# Patient Record
Sex: Male | Born: 1960 | Race: White | Hispanic: No | Marital: Married | Smoking: Never smoker
Health system: Southern US, Academic
[De-identification: ages and names within clinical notes are randomized; demographics above are authoritative.]

## PROBLEM LIST (undated history)

## (undated) ENCOUNTER — Encounter (HOSPITAL_COMMUNITY): Admission: RE | Payer: Self-pay | Source: Ambulatory Visit

## (undated) ENCOUNTER — Ambulatory Visit (HOSPITAL_COMMUNITY): Admission: RE | Payer: BC Managed Care – PPO | Source: Ambulatory Visit | Admitting: Internal Medicine

## (undated) DIAGNOSIS — Z973 Presence of spectacles and contact lenses: Secondary | ICD-10-CM

## (undated) DIAGNOSIS — G4730 Sleep apnea, unspecified: Secondary | ICD-10-CM

## (undated) DIAGNOSIS — F419 Anxiety disorder, unspecified: Secondary | ICD-10-CM

## (undated) DIAGNOSIS — F410 Panic disorder [episodic paroxysmal anxiety] without agoraphobia: Secondary | ICD-10-CM

## (undated) DIAGNOSIS — R1110 Vomiting, unspecified: Secondary | ICD-10-CM

## (undated) DIAGNOSIS — K219 Gastro-esophageal reflux disease without esophagitis: Secondary | ICD-10-CM

## (undated) DIAGNOSIS — E785 Hyperlipidemia, unspecified: Secondary | ICD-10-CM

## (undated) DIAGNOSIS — I10 Essential (primary) hypertension: Secondary | ICD-10-CM

## (undated) HISTORY — DX: Vomiting, unspecified: R11.10

## (undated) HISTORY — PX: HX HAND SURGERY: 2100001299

## (undated) HISTORY — DX: Hyperlipidemia, unspecified: E78.5

## (undated) HISTORY — DX: Essential (primary) hypertension: I10

## (undated) HISTORY — DX: Gastro-esophageal reflux disease without esophagitis: K21.9

## (undated) HISTORY — PX: HX WISDOM TEETH EXTRACTION: SHX21

## (undated) SURGERY — GASTROSCOPY
Anesthesia: Monitor Anesthesia Care

## (undated) SURGERY — COLONOSCOPY
Anesthesia: Monitor Anesthesia Care

---

## 1996-07-20 ENCOUNTER — Ambulatory Visit (HOSPITAL_COMMUNITY): Payer: Self-pay

## 2006-12-07 ENCOUNTER — Ambulatory Visit (INDEPENDENT_AMBULATORY_CARE_PROVIDER_SITE_OTHER): Payer: Self-pay | Admitting: Ophthalmology

## 2009-12-21 HISTORY — PX: HX ELBOW SURGERY: 2100001297

## 2013-06-20 HISTORY — PX: HX HAND SURGERY: 2100001299

## 2013-07-10 ENCOUNTER — Ambulatory Visit (INDEPENDENT_AMBULATORY_CARE_PROVIDER_SITE_OTHER): Payer: BC Managed Care – PPO | Admitting: Orthopaedic Surgery

## 2013-07-10 ENCOUNTER — Encounter (INDEPENDENT_AMBULATORY_CARE_PROVIDER_SITE_OTHER): Payer: Self-pay | Admitting: Orthopaedic Surgery

## 2013-07-10 VITALS — BP 118/76 | Ht 72.0 in | Wt 215.0 lb

## 2013-07-10 MED ORDER — HYDROCODONE 5 MG-ACETAMINOPHEN 325 MG TABLET
1.0000 | ORAL_TABLET | ORAL | Status: DC | PRN
Start: 2013-07-10 — End: 2013-07-26

## 2013-07-11 NOTE — H&P (Addendum)
 [facility]  [address], [address]  [place], [place] [address]        OFFICE VISIT    PATIENT NAME:      Danny [name], Danny [name]  VISIT IDENTIFICATION     [id number]  MEDICAL RECORD NUMBER [id number]    DICTATING PHYSICIAN: Garnette HERO. [place], PA-C   REFERRING PHYSICIAN:         DOB: [birth year]  DOS: 07/10/2013    cc:      HISTORY OF PRESENT ILLNESS:  Danny [name] is very pleasant 52 year old male that presents to the office today with  complaints of the left hand pain. The patient states that on July 03, 2013 he  was playing fetch with his dog when he was bit on his 4th digit as well as  within the proximal aspect of the left palm. He states that he traveled then  to  [place]  to help his son during a golf tournament when his hand  began to swell and he began to develop redness and fever. He then presented to  the emergency department in [place]  where he was treated by an  orthopedic surgeon and was treated with open debridement and irrigation of the  wounds as well as with Augmentin  therapy p.o. He states he has been  tolerating antibiotics very well. He presents to the office today for a wound  check. He states that his pain has primarily subsided as well as the redness  but that he has also been nonweightbearing with his left upper extremity. He  was placed in a volar wrist splint and advised to follow up with us  to  establish care. He states that his fever has since resolved and redness, that  he is aware of, has also resolved. He states that he is still experiencing  pain worse with movement and bumping of the left hand. He rates his pain today  as 6 out of 10. He states that Norco and over-the-counter nonsteroidal  antiinflammatory drugs (NSAIDs) have made his pain better as well as  elevation. He denies any further trauma or injury since this time. He also  today denies any pins and needle sensations, paralysis, paresthesias, numbness  or bowel or left upper extremity. He denies any fevers, chills, night  sweats,  nausea, vomiting, abdominal pain, chest pain or shortness of breath or  dyspnea.       REVIEW OF SYSTEMS:  Review of systems as per history of present illness (HPI); otherwise, review  of systems is negative.     PAST MEDICAL HISTORY:  Past medical history is significant for hypertension, hyperlipidemia and  gastroesophageal reflux disease (GERD).     PAST SURGICAL HISTORY:   Past surgical history is significant for  left elbow surgery in 2011 as well  as left hand surgery in July 2014 with irrigation and debridement.     FAMILY HISTORY:  None.     SOCIAL HISTORY:  The patient denies any tobacco or intravenous (IV) drug abuse. He does admit  to intermittent and social alcohol use.     MEDICATIONS:  Medications include Welchol  3.75 grams p.o. daily, Norco 5/325 milligram p.o.  every 4 hours p.r.n. pain, Benicar  40 milligrams p.o. daily, aspirin  81  milligrams p.o. daily, Lipitor 40 milligrams p.o. daily, coenzyme Q10 30  milligrams p.o. daily, Prevacid  50 milligrams p.o. daily, Claritin 10  milligrams daily, Nasonex 50 micrograms nasal spray daily and multivitamin  p.o. daily.     ALLERGIES:  Allergies include PERCOCET.  PHYSICAL EXAMINATION:  GENERAL: Reveals 52 year old male in no apparent distress; awake, alert, and  oriented x3. He is well-nourished, well-developed, and appearing his stated  age.   VITAL SIGNS: Blood pressure: 118/76. Weight: 250 pounds. Height: 6 feet. Body  mass index (BMI): 29.16.   HEENT: Normal. Extraocular movements are intact.   LUNGS: Respirations are clear. Symmetrical rise and fall of the chest wall  with inspiration and expiration.   ABDOMEN: Soft, nondistended.   MUSCULOSKELETAL: Examination today of the left hand outside of the volar wrist  splints as well as out of the dressings reveals an incision over the proximal  phalanx of the left ring finger with clean wound bases as well as with  granulation tissue appreciated. Wound over the proximal aspect of the left  palmar  near the base of the 4th digit also reveals a larger wound with  granulation intact. Range of motion of the left ring finger is intact. Flexor  and extensor tendons are intact. Grip strength was not tested in the office.  Extensor pollicis longus (EPL) and extensor pollicis brevis (EPB) tendons are  intact. He is able to oppose and abduct his left thumb. He displays full  active range of motion of the left wrist. There is no significant erythema or  redness surrounding these incisions. There is no purulent drainage  appreciated.   NEUROLOGICAL: Cranial nerves 2 through 12 are grossly intact. No sign of any  focal motor deficit. He remains sensate to light touch in all distal nerve  distributions of the left upper extremity.   CARDIOVASCULAR: Radial pulse is palpated as 2+ on the left. Mild left-sided  upper extremity edema is appreciated. Brisk capillary refill in all distal  phalanges of the left upper extremity.   INTEGUMENTARY: As described in musculoskeletal examination; otherwise, no  jaundice, rashes or other visible lesions.   PSYCHIATRIC: Mood and affect are appropriate.     IMAGING DATA:  No imaging data.     ASSESSMENT:  Left hand wound status post irrigation and debridement status post dog bite  infection.     PLAN:  Our plan for a Danny [name] at this time will be to continue Augmentin  systemic p.o.  therapy to help prevent against further infection. He will finish out this  prescription and then present to physical therapy for whirlpool therapy. He  will continue to pack his incisions daily or every other day with physical  therapy. He will otherwise present to our office next week for a wound check.  If he develops any fevers, chills, night sweats or purulent drainage from his  incisions he was advised to contact the office immediately. After discussing  this plan with the patient today, he is very understanding and agreeable. All  of the patient's questions were answered to his satisfaction today in  the  office.                                  Garnette HERO. [place], [place]                                Fairy Goo, MD        d:  07/10/2013 19:45:11  t:  07/11/2013 17:48:46  nb  doc#:   [id number]  voice#:  [id number]  <START FOOTER> Page 3 of 3  <end footer>

## 2013-07-14 ENCOUNTER — Telehealth (INDEPENDENT_AMBULATORY_CARE_PROVIDER_SITE_OTHER): Payer: Self-pay | Admitting: Orthopaedic Surgery

## 2013-07-14 NOTE — Telephone Encounter (Signed)
Please call in Augmentin 875/125mg   Disp: 10 tabs  Sig: 1 tab po BID  0 refills.    Reason for appointment is to look at wound.  Wound will be unpacked, examined, and then wrapped.  If he would like I will lightly pack until his appt with PT that day.    SMS

## 2013-07-14 NOTE — Telephone Encounter (Signed)
If he is not experiencing any bowel issues with the medication then yes I would prefer him to continue the medication until his next follow-up with me.  Please call it in exactly as it was prescribed prior (same dosage, route, frequency, etc.)   Disp: the amount he will require until he sees me back and you may call this in under my name.  No longer than 5 days more of Antibiotics.

## 2013-07-19 ENCOUNTER — Ambulatory Visit (INDEPENDENT_AMBULATORY_CARE_PROVIDER_SITE_OTHER): Payer: BC Managed Care – PPO | Admitting: Orthopaedic Surgery

## 2013-07-19 ENCOUNTER — Encounter (INDEPENDENT_AMBULATORY_CARE_PROVIDER_SITE_OTHER): Payer: Self-pay | Admitting: Orthopaedic Surgery

## 2013-07-19 VITALS — BP 124/86 | Ht 72.0 in | Wt 215.0 lb

## 2013-07-24 NOTE — Progress Notes (Addendum)
[facility]  [address]  [place], [place] [address]        FOLLOW-UP VISIT    PATIENT NAME:        Danny [name], Danny [name]  VISIT IDENTIFICATION     [id number]  MEDICAL RECORD NUMBER [id number]    DICTATING PHYSICIAN: Bertram Millard. [place], PA-C   REFERRING PHYSICIAN:    DOB: Feb 11, 1961  DOS: 07/19/2013    cc:    HISTORY OF PRESENT ILLNESS:  Rodrickus is a very pleasant 52 year old male that presents to the office today  for a follow-up of his left hand wound as well as left ring finger wound  status post incision and drainage from a dog bite. The patient states that he  continues to take his antibiotics prescribed to him from Dr. Merlyn Lot. He also  states that he continues to take Norco as needed for his pain. He states that  he has been attending physical therapy, that he has been doing very well with  his whirlpool therapy. He denies any issues with his wounds. He states that  they have been packing less and less each time that he presents to them. He  states that he is still experiencing constant, dull left ring finger pain  primarily over the distal most incision, but states that this is better with  pain medication, elevation and rest. He rates his pain today as 5 out of 10.  He states that packing his wound makes his pain worse, while resting makes his  pain better. He otherwise today denies any further pins and needle sensations,  paralysis, paresthesias, numbness, change in color or pallor of the left upper  extremity. He denies any further trauma or injury to the left hand. He denies  any fevers, chills, night sweats, nausea, vomiting, abdominal pain, shortness  of breath or dyspnea.     REVIEW OF SYSTEMS:  Review of systems as per history of present illness (HPI); otherwise, review  of systems is negative.     Past medical history, past surgical history, family history, social history,  medications and allergies were all reviewed with the patient today and remain   unchanged from patient's prior office visit on July 10, 2013.     PHYSICAL EXAMINATION:  GENERAL:  Reveals a very pleasant 52 year old male in no apparent distress.  Awake, alert, and oriented x3. He is well-nourished, well-developed, appearing  his stated age.   VITAL SIGNS:  Blood pressure: 124/86. WEIGHT:  215 pounds. HEIGHT:  6 feet.  Body mass index (BMI) is 29.16.   MUSCULOSKELETAL:  Examination today of the left upper extremity, once bandages  are removed, reveals a scab over the proximal left 4th ring finger wound over  the middle phalanx. There is a clean base to the distal most 4th finger wound.  This has a clean base and granulation is appreciated. There is no erythema,  redness, discharge or drainage appreciated. The wound over the palm of the  left 4th digit displays granulation tissue with a clean base. No erythema,  redness, discharge or drainage appreciated from it. Extension and flexion  tendons are intact. Grip strength was not tested today in the office. Extensor  pollicis longus (EPL) and extensor pollicis brevis (EPB) tendons are intact.  He is able to oppose and abduct his left thumb. He does display some pallor  with his hand held low, as this was elevated. This resolved. He displays full,  active range of motion of the left wrist. There are  no signs of cellulitis,  erythema or redness surrounding the left hand.   NEUROLOGICAL:  Cranial nerves II through XII grossly intact. No sign of any  focal motor deficit. He remains sensate to light touch in all distal nerve  distributions of the left upper extremity.   CARDIOVASCULAR:  Radial pulse is palpated as 2+ today on the left. No  significant left-sided upper extremity edema is appreciated. Brisk capillary  refill in all distal phalanges of the left upper extremity.   INTEGUMENTARY:  As per musculoskeletal examination; otherwise, no jaundice,  rashes, or other visible lesions.     ASSESSMENT:   Left hand wound status post irrigation and debridement, status post dog bite  and infection.     PLAN:  My plan for Layken at this time will be for him to continue and finish  Augmentin as well as his hydrocodone. He was advised to begin to wean off this  and begin taking acetaminophen as needed for his pain. He will otherwise  continue with physical therapy and whirlpool. He will continue packing this  wound until they are unable to be packed any further. I would like him to  present to the office in approximately one week for another wound check prior  to his whirlpool therapy. Otherwise, he will continue bandaging and dressing  his left hand as he has been for the past week. If he develops any further  pains, problems or concerns, he was advised to contact the office immediately.  If he develops any fevers, chills, night sweats, purulent drainage or  discharge, he was advised also to contact the office immediately. Otherwise, I  will see him back in one week, sooner if he develops any further pains,  problems or concerns. After discussing this plan with the patient today, he  was very understanding and agreeable. All of his questions were answered to  his satisfaction today in the office.                               Bertram Millard. [place], [place]                                Duard Larsen, MD        d:  07/19/2013 10:54:10  t:  07/20/2013 06:45:25  ag/plk  doc#:    [id number]  voice#:  [id number]  <START FOOTER> Page 3 of 3  <end footer>

## 2013-07-26 ENCOUNTER — Encounter (INDEPENDENT_AMBULATORY_CARE_PROVIDER_SITE_OTHER): Payer: Self-pay | Admitting: Orthopaedic Surgery

## 2013-07-26 ENCOUNTER — Ambulatory Visit (INDEPENDENT_AMBULATORY_CARE_PROVIDER_SITE_OTHER): Payer: BC Managed Care – PPO | Admitting: Orthopaedic Surgery

## 2013-07-26 VITALS — BP 124/84 | Ht 72.0 in | Wt 215.0 lb

## 2013-08-09 ENCOUNTER — Ambulatory Visit (INDEPENDENT_AMBULATORY_CARE_PROVIDER_SITE_OTHER): Payer: BC Managed Care – PPO | Admitting: Orthopaedic Surgery

## 2013-08-09 ENCOUNTER — Encounter (INDEPENDENT_AMBULATORY_CARE_PROVIDER_SITE_OTHER): Payer: Self-pay | Admitting: Orthopaedic Surgery

## 2013-08-09 VITALS — BP 120/84 | Wt 215.0 lb

## 2013-08-10 NOTE — Progress Notes (Addendum)
[facility]  [address]  [place], [place] [address]        FOLLOW-UP VISIT    PATIENT NAME:        Danny [name], Danny [name]  VISIT IDENTIFICATION     [id number]  MEDICAL RECORD NUMBER [id number]    DICTATING PHYSICIAN: Bertram Millard. [place], PA-C   REFERRING PHYSICIAN:         DOB: [birth year]  DOS: 08/09/2013    cc:      HISTORY OF PRESENT ILLNESS:  Danny [name] is a very pleasant 52 year old male that presents to the office today  for a follow-up of his left hand, ring finger wounds. The patient is now  approximately 1 month status post open incision and debridement of a dog bite  that occurred on July 03, 2013.  The patient has since discontinued  occupational therapy and whirlpool therapy. He states that it is wounds have  completely healed.  He denies any erythema, redness, discharge or drainage  from these site. He does admit to decreased range of motion of the left ring  finger as well as decreased strength of the left hand. He states that he still  only experiencing intermittent pain that he rates as 2 out of 10 when they  occur. He states that it is a deep ache within the left hand but this has  remained well controlled with Advil.  He states that he is doing very well.   He feels as though he is beginning to make progress in regards to his range of  motion and his hand overall.  He presents to the office today without any  further orthopedic pains, problems or concerns. He, otherwise, today denies  any pins and needle sensations, paralysis, paresthesias, numbness, change  color or pallor of the left upper extremity. He denies any fevers, chills,  night sweats. He denies any further neurological, cardiovascular or other  orthopedic pains, problems or concerns.     REVIEW OF SYSTEMS:  Review of systems as per history of present illness (HPI); otherwise, review  of systems is negative.    Past medical history, past surgical history, family history, social history,   medications and allergies were all reviewed with the patient today and remain  unchanged from the patient's prior office visit on July 10, 2013.     PHYSICAL EXAMINATION:  GENERAL:  Reveals a pleasant 52 year old male in no apparent distress. Awake,  alert, and oriented x3. He is well-nourished, well-developed, and appearing  stated age.   VITAL SIGNS:   Blood pressure: 120/84.  Weight:  215 pounds.  Height:   Undocumented body mass index (BMI) is 29.16.     MUSCULOSKELETAL:  Examination today of the left hand reveals 3 well-healed  incisions. No sign of any erythema, redness, discharge or drainage. There is  minimal edema appreciated over the left ring finger.  Range of motion of the  left ring finger is decreased in regards to extension and flexion. He is able  to make a full fist. Extensor pollicis longus (EPL) and extensor pollicis  brevis (EPB) tendons are intact. There is no significant thenar atrophy  appreciated. Wrist displays full, active range of motion. Grip strength is  rated as 4/5 on the left in comparison to the right, otherwise, he does  display some minimal tenderness to palpation over the palmar incision.   NEUROLOGICAL:  Cranial nerves II through XII grossly intact. No sign of any  focal motor deficit. He remains sensate to light  touch in all distal nerve  distributions of the left upper extremity.   CARDIOVASCULAR:  Radial pulse is palpated as 2+ today on the left. No  significant left-sided upper extremity edema is appreciated. Brisk capillary  in all distal phalanges of the left upper extremity.   INTEGUMENTARY:  Intact; reveals 3 well-healed incisions, otherwise, no sign of  any jaundice, rashes, other visible lesions.     ASSESSMENT:  Left hand wound status post incision debridement, status post dog bite and  infection.     PLAN:  Danny [name] has been doing very well over the past 2 weeks.  He will continue to  work at home with range of motion and strengthening. He was advised to heat,   elevate, take over-the-counter nonsteroidal anti-inflammatory drugs (NSAIDs)  and ice as needed for swelling and range of motion.  He will continue to work  on strengthening of his left upper extremity.  He will continue to work on his  grip strength.  I would like him to follow up with me in approximately 4 to 6  weeks. If he has not shown improvement, otherwise, he feels he will require  occupational outpatient physical therapy and he is advised to contact the  office for a prescription, otherwise, I will see Danny [name] back on an as needed  basis. After discussing this plan with the patient today he is very  understanding and agreeable.            All his questions were answered to his satisfaction today in the office.                                   Bertram Millard. [place]                                Duard Larsen, MD        d:  08/09/2013 08:39:47  t:  08/10/2013 07:38:00  jr  doc#:   [id number]  voice#:  [id number]  <START FOOTER> Page 3 of 3  <end footer>

## 2013-08-12 ENCOUNTER — Encounter (INDEPENDENT_AMBULATORY_CARE_PROVIDER_SITE_OTHER): Payer: Self-pay | Admitting: Orthopaedic Surgery

## 2013-08-14 NOTE — Progress Notes (Addendum)
 [facility]  [address]  [place], [place] [address]        FOLLOW-UP VISIT    PATIENT NAME:        Danny [name], Danny [name]  VISIT IDENTIFICATION     [id number]  MEDICAL RECORD NUMBER [id number]    DICTATING PHYSICIAN: Danny [name]   REFERRING PHYSICIAN:         DOB: 1961-04-26  DOS: 07/26/2013    cc:      HISTORY OF PRESENT ILLNESS:  Danny [name] is a very pleasant 52 year old male that presents to the office  today for follow up of a left hand wound as well as left ring finger wound  over the distal phalanx and middle phalanx, status post dog bite. The patient  underwent incision and drainage, and over the past few weeks, has been  undergoing whirlpool therapy with occupational hand therapy, as well as wound  packing treatments. The patient states that his hand wound near the distal  aspect of the 4th metacarpal remains open. He continues to pack his wound.  However, his 2 ring finger wounds have completely healed. He denies erythema  or redness. He denies any further fevers, chills, night sweats, nausea,  vomiting, abdominal pain, chest pain, shortness of breath or dyspnea. He  denies any pins and needle sensations, paralysis, paresthesias, or numbness of  the left hand, and otherwise, today he denies any further trauma or injury. He  states he has been tolerating occupational therapy, as well as dressing  changes. Otherwise, today presents to the office without any further  orthopedic pains, problems or concerns.     REVIEW OF SYSTEMS:  Review of systems as per history of present illness (HPI); otherwise, review  systems is negative.     Past medical history, past surgical history, family history, social history,  medications and allergies were all reviewed with the patient today and remain  unchanged from the patient's prior offices on July 10, 2013.     PHYSICAL EXAMINATION:  GENERAL:  Reveals a very pleasant 52 year old male in no apparent distress.  Awake, alert, and oriented x3. He is  well-nourished, well-developed, and  appearing his stated age.   VITAL SIGNS:  Blood pressure 124/84. WEIGHT: 215 pounds. HEIGHT: 6 feet.  Body mass index (BMI) is 29.6.   MUSCULOSKELETAL:  Examination today of the left upper extremity reveals an  open left hand wound over the distal most aspect of the 4th metacarpal. This  displays a clean base with chronic granulation changes. There is also an area  of skin necrosis over the wound edges. Otherwise, the hand wounds over the 4th  distal phalanx and 4th middle phalanx appear well-healed. No sign of erythema,  redness, discharge or drainage. He does display decreased range of motion of  the 4th and 5th digits of the left hand. Grip strength was not tested today in  the office. Extensor pollicis longus (EPL) and extensor pollicis brevis (EPB)  tendons are intact. He is able to oppose and abduct his left thumb.   NEUROLOGICAL:  Cranial nerves II through XII are grossly intact. No sign of  any focal motor deficits. He remains sensate to light touch in all distal  nerve distributions the left upper extremity.   CARDIOVASCULAR:  Radial pulse palpated as 2+ today on the left. No significant  left-sided upper extremity edema was appreciated. Brisk capillary refill in  all distal phalanges of the left upper extremity.   INTEGUMENTARY: As per musculoskeletal  examination; otherwise, no sign of any  ecchymosis, abrasions, excoriations, jaundice, rashes, or other visible  lesions.     ASSESSMENT:  Left hand wound.     PLAN:  My plan for Danny [name] at this time will be to continue with occupational hand  therapy wound treatment with whirlpool therapy and packing. He has  discontinued all antibiotics. He was advised to control his pain with  over-the-counter nonsteroidal anti-inflammatory drugs (NSAIDs). He will  continue daily dressing changes. He will follow up with me in approximately 2  weeks for repeat clinical examination. After discussing this plan with the  patient today, he is  very understanding and agreeable. All of his questions  were answered to his satisfaction today in the office.                                     Danny [name]                                        d:  08/12/2013 18:37:37  t:  08/14/2013 12:47:33  ag  doc#:  [id number]  voice#:  [id number]  <START FOOTER> Page 2 of 3  <end footer>

## 2013-10-12 ENCOUNTER — Encounter (INDEPENDENT_AMBULATORY_CARE_PROVIDER_SITE_OTHER): Payer: Self-pay

## 2013-10-12 ENCOUNTER — Ambulatory Visit (INDEPENDENT_AMBULATORY_CARE_PROVIDER_SITE_OTHER): Payer: BC Managed Care – PPO

## 2013-10-12 VITALS — Resp 19 | Ht 72.0 in | Wt 215.0 lb

## 2013-10-12 DIAGNOSIS — R599 Enlarged lymph nodes, unspecified: Secondary | ICD-10-CM

## 2013-10-12 DIAGNOSIS — R591 Generalized enlarged lymph nodes: Secondary | ICD-10-CM

## 2013-10-12 DIAGNOSIS — K219 Gastro-esophageal reflux disease without esophagitis: Secondary | ICD-10-CM

## 2013-10-12 DIAGNOSIS — M7918 Myalgia, other site: Secondary | ICD-10-CM

## 2013-10-12 DIAGNOSIS — IMO0001 Reserved for inherently not codable concepts without codable children: Secondary | ICD-10-CM

## 2013-10-15 NOTE — H&P (Addendum)
[facility] ENT & AUDIOLOGY  [address]  [address]  [place], [place] [address]  [phone]      OFFICE VISIT    PATIENT NAME:      Danny [name], Danny [name]  VISIT IDENTIFICATION     [id number]  MEDICAL RECORD NUMBER      [id number]    DICTATING PHYSICIAN: Alonza Bogus, PA-C   REFERRING PHYSICIAN:         DOB: April 18, 1961  DOS: 10/12/2013    cc:    CHIEF COMPLAINT:  Right lymphadenitis.     HISTORY OF PRESENT ILLNESS:  This is a patient who has had a right lymph node in the upper neck just below  and posterior to the ear for over a year. He saw an ear, nose, and throat  doctor for this in another state and had fine needle aspiration biopsy that  showed lymph node with no evidence of lymphoma. Interestingly, this node did  not show up on a computed axial tomography (CAT) scan. He has been following  this and it has remained stable in size and has not bothered him at all in a  year. He recently started having some pain in the area of the node that  radiates down into the neck and he is here for further evaluation. Old   Records are reviewed.    Medical history through personal/social is per Epic.     REVIEW OF SYSTEMS:    CONSTITUTIONAL: Patient denies any fatigue, weight loss, fever or chills.  EYES:  Patient denies any double vision, blurred vision or loss of vision.  ENT: See history of present illness.   CARDIOVASCULAR: Patient denies any heart racing.  RESPIRATORY: Patient denies any shortness of breath, noisy breathing,  wheezing, asthma or cough.   NEUROLOGICAL: Patient denies any numbness, tingling, seizures or headaches.  GI: Patient denies any nausea, vomiting, indigestion, or heartburn.   GU: Patient denies any increased urinary frequency or painful urination.  ENDOCRINE: Patient denies having brittle hair, hot or cold flashes.  SKIN: Patient denies any rashes or lesions.  MUSCULOSKELETAL: Patient denies any muscle aches or joint aches.   HEM/LYMPH: Patient denies any bleeding or easy bruising.   ALLERGIC/IMM: Patient denies any itchy eyes, ears, nose or palate, watery  eyes, scratchy throat or sneezing excessively.  PSYCHIATRIC: Patient denies any anxiety or depression.    PHYSICAL EXAMINATION:  GENERAL: Patient is in no acute distress.  HEAD:  Head is normocephalic, atraumatic. No palpable salivary gland masses.   FACE:  Face is symmetric, cranial nerve 7 is intact bilaterally.  EYES:  PERRL, EOMI. Sclera is white.  EARS:  External auditory canals are clear. Tympanic membranes are translucent  and healthy appearing bilaterally.  NOSE:  Intranasally, no pus, polyps or epistaxis is appreciated.  ORAL CAVITY:  Healthy appearing lips, teeth, tongue, and gums.  There are no  visible or palpable masses or lesions.   OROPHARYNX:  Oropharynx is clear.  NECK:  There is a small, approximately 1.5 centimeter mass in the neck just  posterior and below the right ear. This is nontender and movable. The lateral  neck muscles are tender and I can trace that tenderness from the mastoid bone  down below into the neck. Pressing on this area duplicates the pain of which  he is complaining.   LYMPH:  No lymphadenopathy palpable in the neck.  NEUROLOGICAL: Cranial nerves 2 through 12 are grossly intact.   SKIN:  Skin is warm and dry to touch.  RESPIRATORY:  No stridor.  MUSCULOSKELETAL:  Extremities move equally well.  PSYCHIATRIC:  Patient is pleasant, cooperative and alert.    STUDIES:    I have reviewed his CAT scan report and his fine needle aspiration biopsy  report for the lymph node.     ASSESSMENT:    1. Persistent right neck lymph node, apparently stable.   2. Myofascial pain in the right.     PLAN:  I discussed behavior modification, neck stretches, and the use of nonsteroidal  antiinflammatory medications with the patient. He is going to work on that and  we will see him back in 3 months. If the pain is not resolved, we will  investigate this more fully.                                  Alonza Bogus, PA-C                                 Danny Amos Beckey Downing, MD    I have reviewed the H&P/ Findings/ Assessment/ Plan of the PA/ Resident/ Student/ NP & agree with the said documentation.    Kae Heller, MD 10/20/2013, 6:17 PM      d:  10/12/2013 14:54:34  t:  10/15/2013 10:05:18  nb  doc#:    [id number]  voice#:  [id number]  <START FOOTER> Page 2 of 2  <end footer>

## 2013-12-21 HISTORY — PX: COLONOSCOPY: SHX174

## 2013-12-21 HISTORY — PX: ESOPHAGOGASTRODUODENOSCOPY: SHX1529

## 2014-01-16 ENCOUNTER — Encounter (INDEPENDENT_AMBULATORY_CARE_PROVIDER_SITE_OTHER): Payer: Self-pay | Admitting: Otolaryngology

## 2014-06-17 IMAGING — CR DG CHEST 2V
2 series · 2 of 2 positions shown · non-contrast
Comparison: None.

CLINICAL DATA: Jaw pain, abdominal pain

CHEST - 2 VIEW

[w chest pa]
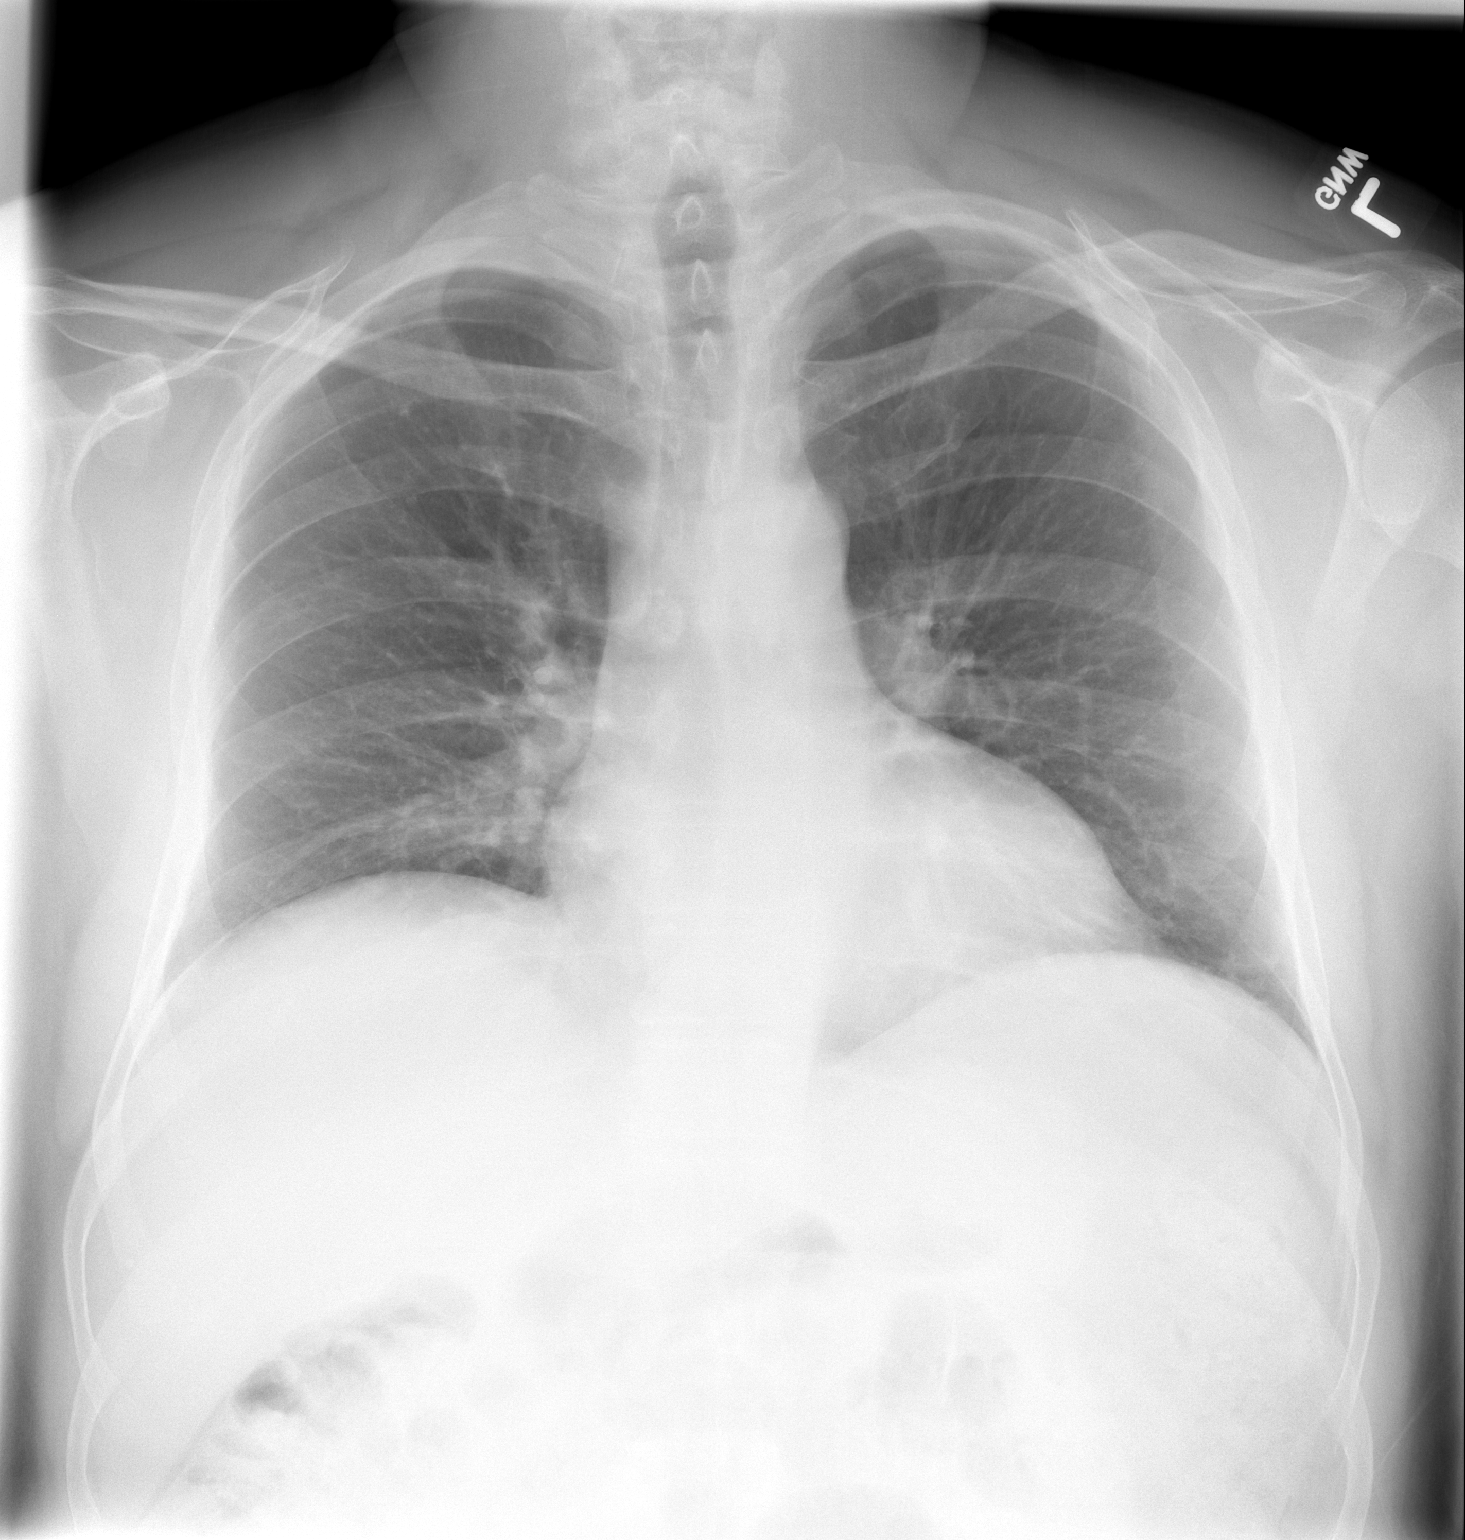

[w chest lat]
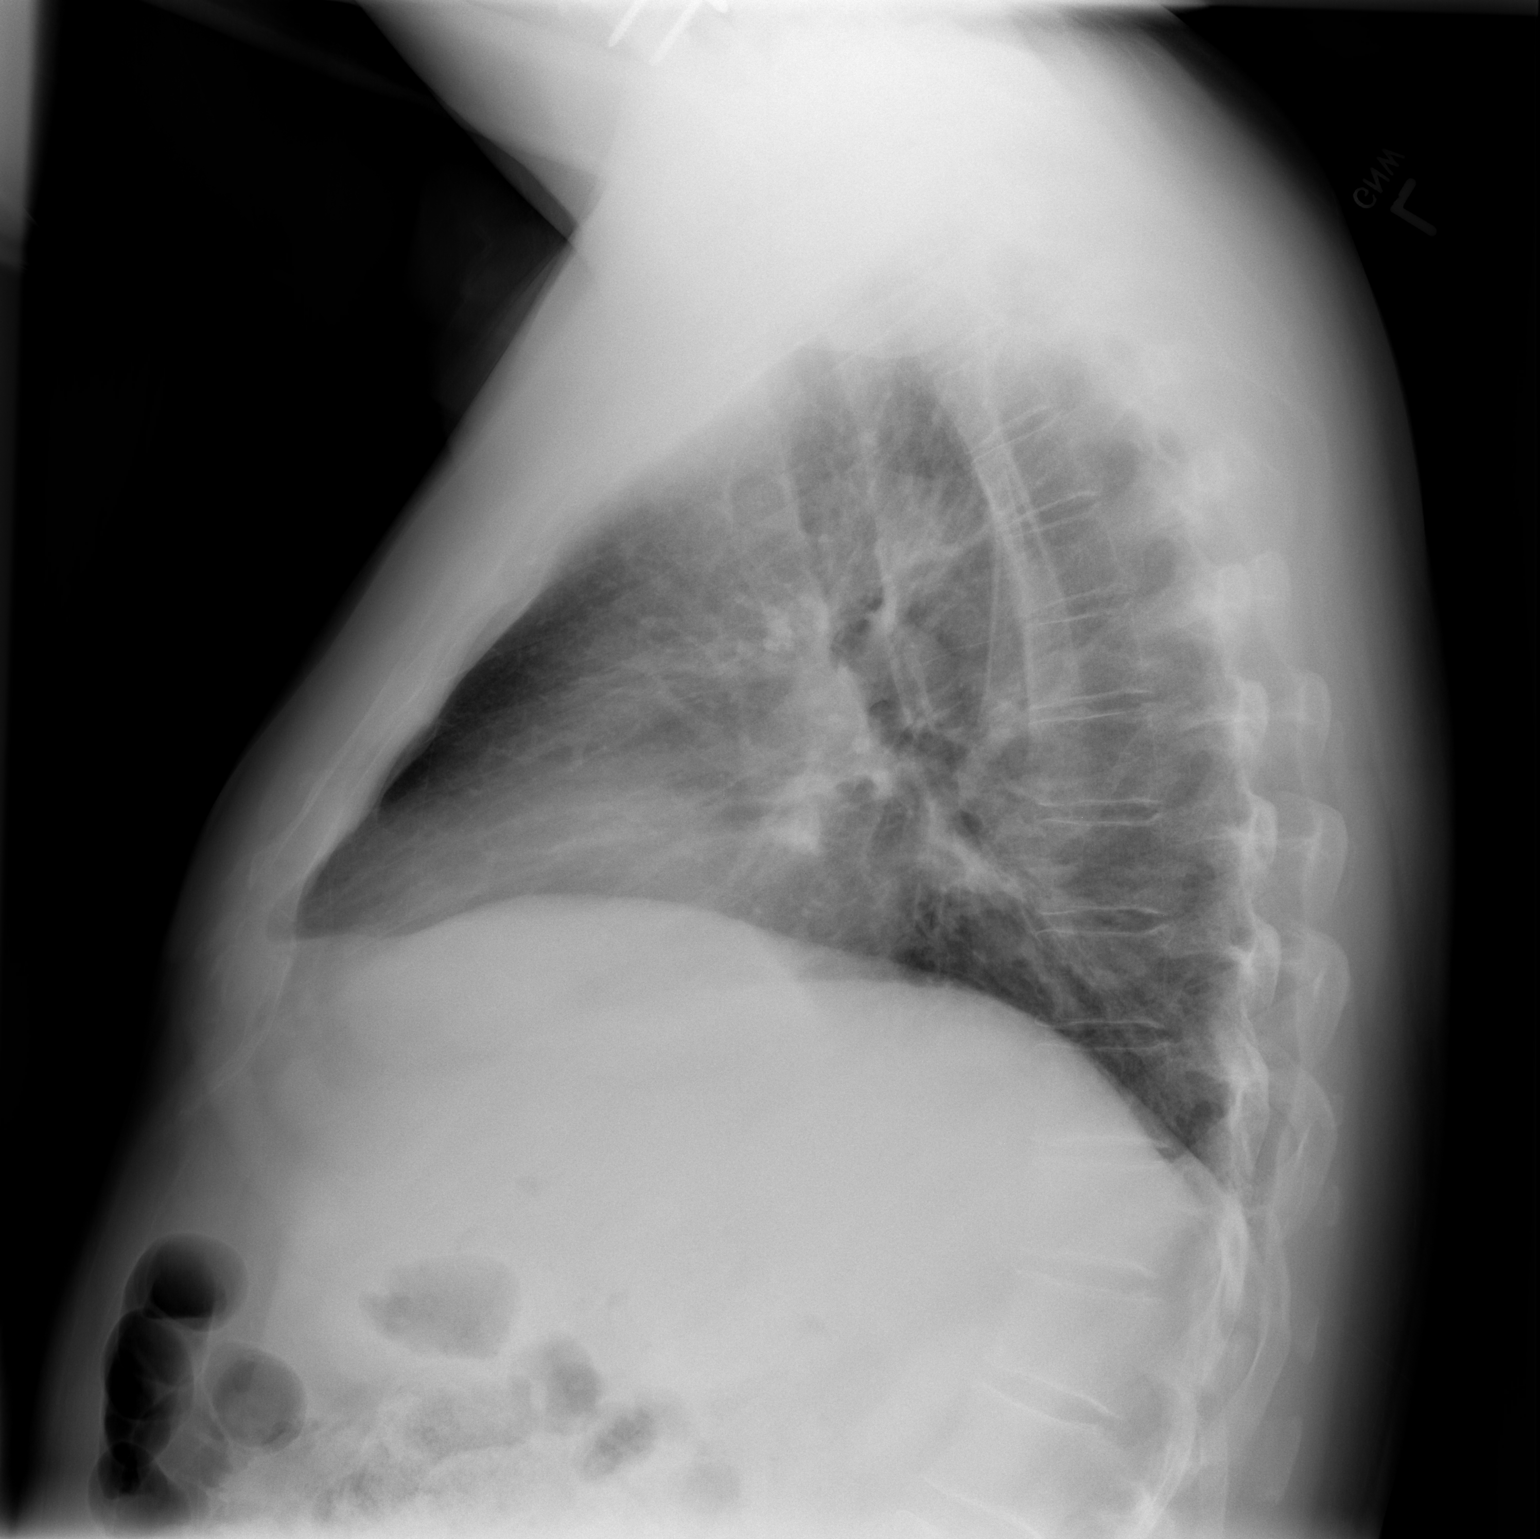

[2 of 2 positions shown; findings below may reference images not displayed]

FINDINGS: The cardiac and mediastinal silhouettes are within normal
limits.

The lungs are hypoinflated.  Bibasilar linear opacities most likely
reflect crowding of the normal bronchovascular markings and / or
atelectasis due to hypoinflation.  No airspace consolidation is
identified.  There is no pulmonary edema or pleural effusion.  No
pneumothorax.

The visualized soft tissues and osseous structures are within
normal limits.
IMPRESSION: Hypoinflation without acute cardiopulmonary process identified.

## 2014-10-04 ENCOUNTER — Encounter (INDEPENDENT_AMBULATORY_CARE_PROVIDER_SITE_OTHER): Payer: Self-pay | Admitting: Otolaryngology

## 2015-01-01 ENCOUNTER — Encounter (INDEPENDENT_AMBULATORY_CARE_PROVIDER_SITE_OTHER): Payer: BC Managed Care – PPO | Admitting: Otolaryngology

## 2015-03-05 ENCOUNTER — Ambulatory Visit (INDEPENDENT_AMBULATORY_CARE_PROVIDER_SITE_OTHER): Payer: BC Managed Care – PPO | Admitting: Otolaryngology

## 2015-03-05 VITALS — Resp 16 | Ht 71.0 in | Wt 205.0 lb

## 2015-03-05 DIAGNOSIS — R590 Localized enlarged lymph nodes: Secondary | ICD-10-CM

## 2015-03-05 DIAGNOSIS — J342 Deviated nasal septum: Secondary | ICD-10-CM

## 2015-03-05 DIAGNOSIS — J309 Allergic rhinitis, unspecified: Secondary | ICD-10-CM

## 2015-03-05 NOTE — Progress Notes (Signed)
[facility] ENT  [address]  [place] [address]  [phone]      Date: 03/05/2015  Name: Danny [name]  Age: 54 y.o.  DOB:  1961/10/19    Chief Complaint: Cervical Lymphadenopathy and Allergic Rhinitis      History of Present Illness:     Danny [name] is a 54 y.o. male who presents today for evaluation of a right neck lymphadenopathy.  He reports that the nodule has been present for four years. This was previously biopsied by FNA at an outside facility. The patient states it was done by a ENT in [facility], [place]. The report was negative for lymphoma by flow cytometry. The lesion does not really seem to be growing. He has no real pain secondary to the lesion. There is a family history of cancer. The patient is a non-smoker. It was mentioned for him to have an MRI to further evaluate the lesion. He states that a friend had performed an ultrasound of the lesion previously. No other lumps, bumps, masses or growths. He also has problems with allergy symptoms. These are getting worse over the years. He was previously on immunotherapy for a short period of time. He has had problems with sinus infections but usually tries not to go for treatment. He will occasionally get so bad that he feels the need to seek treatment and he usually gets a steroid shot. He has a history of nasal trauma. He does notice difficulty breathing through his nose.    Past Medical History:     Past Medical History   Diagnosis Date    Hypertension     Elevated lipids     GERD (gastroesophageal reflux disease)          Past Surgical History   Procedure Laterality Date    Hx hand surgery Left 06/2013     incision and drainage    Hx elbow surgery Left 2011    Hx hand surgery Left      incision and drainage       Current Outpatient Prescriptions   Medication Sig    aspirin 81 mg Oral Tablet, Chewable Take 81 mg by mouth Once a day    atorvastatin (LIPITOR) 40 mg Oral Tablet Take 40 mg by mouth Every night    coenzyme Q10 30 mg  Oral Capsule Take 30 mg by mouth Every evening with dinner    Colesevelam (WELCHOL) 3.75 gram Oral Powder in Packet Take 3.75 g by mouth Once a day    lansoprazole (PREVACID SOLUTAB) 15 mg Oral Tablet,Rapid Dissolve, DR Take 15 mg by mouth Once a day    loratadine (CLARITIN) 10 mg Oral Tablet Take 10 mg by mouth Once a day    mometasone (NASONEX) 50 mcg/actuation Nasal Spray, Non-Aerosol 2 Sprays by Nasal route Once a day    multivitamin Oral Tablet Take 1 Tab by mouth Once a day    Olmesartan (BENICAR) 40 mg Oral Tablet Take 40 mg by mouth Once a day     Allergies   Allergen Reactions    Percocet [Oxycodone] Anaphylaxis     Family History   Problem Relation Age of Onset    Cancer      HTN <20 y.o.         History   Substance Use Topics    Smoking status: Never Smoker     Smokeless tobacco: Never Used    Alcohol Use: Yes  Review of Systems:     CONSTITUTIONAL:  negative for fevers, chills and weight loss  ENT: Negative for the remainder of the ENT review of systems except as documented in the HPI.  RESPIRATORY:  negative for difficulty breathing, wheezing, stridor, tachypnea  SKIN:  negative for - dry skin or rash  PSYCHIATRIC:  negative for aggressive behavior, depression and illegal drug usage    Physical Examination:     Resp 16   Ht 1.803 m (5\' 11" )   Wt 92.987 kg (205 lb)   BMI 28.60 kg/m2    General Appearance: pleasant, cooperative, no distress, healthy, normocephalic, appears stated age, in no acute distress and alert  Eyes: PERRL, Sclera non-icteric  Head and Face: Facies symmetric, no obvious lesions.  External Ears:normal pinnae shape and position and no signs of inflammation  External Auditory Canal - Left: Patent without inflammation.  Tympanic Membrane - Left: intact, translucent, midposition, middle ear aerated  External Auditory Canal - Right: Patent without inflammation.  Tympanic Membrane - Right: intact, translucent, midposition, middle ear aerated  Nose: external pyramid  midline, Deviated Septum to the left,  mucosa normal,  polyps, or crusts. Slight discolored drainage in right middle meatus  Oral Cavity/Oropharynx: No mucosal lesions, masses, or pharyngeal asymmetry. and Mucus membranes moist  Hypopharynx: Deferred  Neck: no cervical adenopathy, no palpable thyroid or salivary gland masses and trachea midline  Thyroid: no significant thyroid abnormality by palpation  Respiratory: No stridor, breathing unlabored.  Skin: Skin warm and dry, No rashes and No lesions  Neurologic: grossly normal and no tremor     Extremities:  Moves each extremity well.  Lymphatic: No lymphadenopathy  Psychiatric: Alert, Cooperative, Normal speech pattern  Procedure:     None    Data Reviewed:       Assessment and Plan:     Danny [name] was seen today for cervical lymphadenopathy and allergic rhinitis.    Diagnoses and all orders for this visit:    Cervical lymphadenopathy  Comments:   right lateral  Discussed excision of the nodule versus monitoring.  Discussed doing a CT scan; however, the patient is concerned with the radiation exposure.  He also reports being moderately claustrophobic.  Ely states that he will consider the excisional procedure and contact our office if he plans to proceed with the procedure.    Allergic rhinitis  Orders:  -     MQT (MODIFIED QUANTITATIVE TESTING) ; Future    Deviated nasal septum    During today's visit and evaluation we have discussed treatment for chronic rhinosinusitis and allergic rhinitis. I have informed the patient that there are several different ways to treat chronic allergies and chronic rhinosinusitis including the use of medications, several of which have been prescribed or continued today. In addition we have discussed the possibility of allergy testing and immunotherapy if positive. Allergy testing can be performed via skin testing or blood work (RAST). They were made aware that patients on certain medications (i.e. BETA BLOCKERS) and not candidates for  immunotherapy. I also discussed the various surgical techniques that are available to address chronic rhinosinusitis. I have also discussed with the patient that often times multiple modalities are required to obtain optimal control of their sinus disease/allergies.      At this time we will proceed with the following: Allergy testing and immunotherapy if positive to environmental allergens.  I also discussed continuation of current adjunctive medications.      Plan for a return to clinic for evaluation  after allergy testing, sooner should there be problems.     Toya Smothers, MD 03/05/2015, 12:06

## 2015-03-11 ENCOUNTER — Encounter (INDEPENDENT_AMBULATORY_CARE_PROVIDER_SITE_OTHER): Payer: Self-pay

## 2015-04-26 ENCOUNTER — Ambulatory Visit: Payer: BC Managed Care – PPO

## 2015-04-26 DIAGNOSIS — J3089 Other allergic rhinitis: Secondary | ICD-10-CM

## 2015-04-26 DIAGNOSIS — J309 Allergic rhinitis, unspecified: Secondary | ICD-10-CM

## 2015-04-26 NOTE — Progress Notes (Signed)
[name]              Phone: [phone]   [address] [place], [place] [address]          Fax:     [phone]  Allergy Department Phone [phone]    Providers - K. Madelaine Bhat, MD     [place] Seamen, MD     Crawford Givens, PA-C     Date: 04/26/2015  Name: Danny [name]  Age: 54 y.o.  DOB:  08/16/1961    Chief Complaint: Allergic Rhinitis    Danny [name] a 54 y.o. male came in today for Allergy Testing as scheduled.  Patient confirms that he has not used any antihistamines, or any contraindicated medications as outlined in pre-testing packet over the last seven days.  Patient also confirms that he is currently not on a beta blocker.  Danny [name] came in with the following symptoms: nasal congestion, rhinorrhea, eye itching, eye irritation, morning sore throat, cough, as well as post nasal drip with postnasal drainage, nasal congestion and cough rhinorrhea symptom being the most problematic (also see scanned allergy history questionnaire).  Danny [name] also states that [place] season causes the most problems with his allergies.  Danny [name] has tried  CLARITIN,NASONEX,FLONASE,BENADRYL in the past with poor relief.    Informed consent was obtained from the patient prior initiating testing.  Diet diary and allergy history questionnaire was reviewed with the patient.  MQT (allergy testing) was performed using prick and intradermal skin testing after confirming appropriate controls (positive histamine skin test, and negative glycerin skin test).  Procedure was tolerated well with little to no discomfort. Testing yielded positive results to POLLENS,MOLDS,CANDIDA,CAT ,RABBIT,DOG.  Danny [name] was counseled to avoid MOLD AND POLLEN RELATED products.  Information was also given to [name] on how to avoid the allergens environmentally.  Patient will partake in weekly allergy injections.  He was also instructed to schedule a 4 MONTHS follow up with [name].    Danny [name] was seen today for allergic  rhinitis.    Diagnoses and all orders for this visit:    Other allergic rhinitis    Allergic rhinitis  Orders:  -     MQT (MODIFIED QUANTITATIVE TESTING)

## 2015-04-26 NOTE — Procedures (Signed)
SEE PROGRESS

## 2015-04-30 ENCOUNTER — Other Ambulatory Visit: Payer: BC Managed Care – PPO

## 2015-04-30 DIAGNOSIS — J3089 Other allergic rhinitis: Secondary | ICD-10-CM

## 2015-04-30 NOTE — Progress Notes (Signed)
[facility] ENT & Audiology              Phone: [phone]   [address] [place], [place] [address]          Fax:     [phone]  Allergy Department Phone [phone]    Providers - K. Madelaine Bhat, MD     [place] Seamen, MD     Crawford Givens, PA-C       Danny [name] is a new patient who was recently tested using MQT.  [name] has positive result with the allergy test and wishes to initiate immunotherapy.  he did not have systemic reaction during or after the allergy.        Serum Mixed 04/30/2015:  Payor: BLUE CROSS BLUE SHIELD / Plan: [place] BC/[place] PPO / Product Type: PPO /      MAIV 10 DOSES #1 start @ 0.05, 0.1, 0.15,0.2 INCREASING AS TOLERATED     MAIV 10 DOSES #2 start @ 0.05, 0.1, 0.15,0.2 INCREASING AS TOLERATED  MAIV 10 DOSES #3 start @ 0.05X4, 0.07X3, 0.1  MAIV 10 DOSES #4 start @ 0.05, 0.1, 0.15,0.2 INCREASING AS TOLERATED      Allergy Serum Mixed in Normal Saline Solution    Allergy shots will be received 103        ICD-10-CM    1. Other allergic rhinitis J30.89 ALLERGY SERUM MIXING ([place])     ADMIN ALLERGY INJ OR PATIENT SUPPLIED MEDICATION     ALLERGY SERUM MIXING ([place])        04/30/2015-Danny [name]-[birth year]-Payor: BLUE CROSS BLUE SHIELD / Plan: [place] STATE BC/BS PPO / Product Type: PPO /

## 2015-05-01 ENCOUNTER — Ambulatory Visit: Payer: BC Managed Care – PPO

## 2015-05-01 DIAGNOSIS — J3089 Other allergic rhinitis: Secondary | ICD-10-CM

## 2015-05-01 NOTE — Progress Notes (Signed)
05/01/15 1400   ALLERGY   NEW VIAL (!) YES   Any fever, illness, or infection in the last 24 hours No   Any changes to your medication since your last visit No   Vial #1 0.05   Vial #2 0.05   Vial #3 0.05   Vial #4 0.05   Site Given Left   Given By (Initials) anl   Any Local Reaction No   Patient tolerated well Yes   Diagnosis Code all other

## 2015-05-09 ENCOUNTER — Ambulatory Visit: Payer: BC Managed Care – PPO

## 2015-05-09 DIAGNOSIS — J3089 Other allergic rhinitis: Secondary | ICD-10-CM

## 2015-05-09 NOTE — Progress Notes (Signed)
05/09/15 1300   ALLERGY   NEW VIAL NO   Reaction to previous injection? No   Any fever, illness, or infection in the last 24 hours No   Any changes to your medication since your last visit No   How long does your allergy shot last 3-4 days   Vial #1 0.1   Vial #2 0.1   Vial #4 0.1   Site Given Left   Given By Loletha Carrow) anl   Any Local Reaction No   Patient tolerated well Yes   Diagnosis Code all other

## 2015-05-15 ENCOUNTER — Ambulatory Visit (INDEPENDENT_AMBULATORY_CARE_PROVIDER_SITE_OTHER): Payer: BC Managed Care – PPO

## 2015-05-15 DIAGNOSIS — J3089 Other allergic rhinitis: Principal | ICD-10-CM

## 2015-05-15 NOTE — Progress Notes (Signed)
05/15/15 1400   ALLERGY   NEW VIAL NO   Reaction to previous injection? No   Any fever, illness, or infection in the last 24 hours No   Any changes to your medication since your last visit No   How long does your allergy shot last 3-4 days   Vial #1 0.15   Vial #2 0.15   Vial #3 0.05   Vial #4 0.15   Site Given Right   Given By Loletha Carrow) anl   Any Local Reaction No   Patient tolerated well Yes   Diagnosis Code all other

## 2015-05-22 ENCOUNTER — Ambulatory Visit (INDEPENDENT_AMBULATORY_CARE_PROVIDER_SITE_OTHER): Payer: BC Managed Care – PPO

## 2015-05-22 DIAGNOSIS — J3089 Other allergic rhinitis: Secondary | ICD-10-CM

## 2015-05-22 NOTE — Progress Notes (Signed)
05/22/15 1300   ALLERGY   NEW VIAL NO   Reaction to previous injection? No   Any fever, illness, or infection in the last 24 hours No   Any changes to your medication since your last visit No   How long does your allergy shot last 3-4 days   Vial #1 0.2   Vial #2 0.2   Vial #3 0.05   Vial #4 0.2   Site Given Left   Given By Loletha Carrow) klf   Any Local Reaction No   Patient tolerated well Yes   Diagnosis Code all other

## 2015-05-30 ENCOUNTER — Ambulatory Visit: Payer: BC Managed Care – PPO

## 2015-05-30 DIAGNOSIS — J3089 Other allergic rhinitis: Principal | ICD-10-CM

## 2015-05-30 NOTE — Progress Notes (Signed)
05/30/15 1300   ALLERGY   NEW VIAL NO   Reaction to previous injection? No   Any fever, illness, or infection in the last 24 hours No   Any changes to your medication since your last visit No   How long does your allergy shot last 3-4 days   Vial #1 0.25   Vial #2 0.25   Vial #3 0.01   Vial #4 0.25   Site Given Left   Given By Loletha Carrow) [facility]   Any Local Reaction No   Patient tolerated well Yes   Diagnosis Code all other

## 2015-06-05 ENCOUNTER — Ambulatory Visit: Payer: BC Managed Care – PPO

## 2015-06-05 ENCOUNTER — Encounter (INDEPENDENT_AMBULATORY_CARE_PROVIDER_SITE_OTHER): Payer: Self-pay

## 2015-06-05 DIAGNOSIS — J3089 Other allergic rhinitis: Principal | ICD-10-CM

## 2015-06-05 NOTE — Progress Notes (Signed)
06/05/15 1400   ALLERGY   NEW VIAL NO   Reaction to previous injection? No   Any fever, illness, or infection in the last 24 hours No   Any changes to your medication since your last visit No   How long does your allergy shot last 3-4 days   Vial #1 0.3   Vial #2 0.3   Vial #3 0.07   Vial #4 0.3   Site Given Right   Given By Loletha Carrow) klf   Any Local Reaction No   Patient tolerated well Yes   Diagnosis Code all other

## 2015-06-12 ENCOUNTER — Ambulatory Visit: Payer: BC Managed Care – PPO

## 2015-06-12 DIAGNOSIS — J3089 Other allergic rhinitis: Principal | ICD-10-CM

## 2015-06-12 NOTE — Progress Notes (Signed)
06/12/15 1300   ALLERGY   NEW VIAL NO   Reaction to previous injection? No   Any fever, illness, or infection in the last 24 hours No   Any changes to your medication since your last visit No   How long does your allergy shot last 3-4 days   Vial #1 0.35   Vial #2 0.35   Vial #3 0.07   Vial #4 0.35   Site Given Left   Given By Loletha Carrow) jh   Any Local Reaction No   Patient tolerated well Yes   Diagnosis Code all other

## 2015-06-19 ENCOUNTER — Ambulatory Visit (INDEPENDENT_AMBULATORY_CARE_PROVIDER_SITE_OTHER): Payer: BC Managed Care – PPO

## 2015-06-19 DIAGNOSIS — J3089 Other allergic rhinitis: Secondary | ICD-10-CM

## 2015-06-19 NOTE — Progress Notes (Signed)
06/19/15 1400   ALLERGY   NEW VIAL NO   Reaction to previous injection? No   Any fever, illness, or infection in the last 24 hours No   Any changes to your medication since your last visit No   How long does your allergy shot last 3-4 days   Vial #1 0.4   Vial #2 0.4   Vial #3 0.1   Vial #4 0.4   Site Given Left   Given By Loletha Carrow) anl   Any Local Reaction No   Patient tolerated well Yes   Diagnosis Code all other

## 2015-06-28 ENCOUNTER — Ambulatory Visit (INDEPENDENT_AMBULATORY_CARE_PROVIDER_SITE_OTHER): Payer: BC Managed Care – PPO

## 2015-06-28 DIAGNOSIS — J3089 Other allergic rhinitis: Secondary | ICD-10-CM

## 2015-06-28 NOTE — Progress Notes (Signed)
06/28/15 1000   ALLERGY   NEW VIAL NO   Reaction to previous injection? No   Any fever, illness, or infection in the last 24 hours No   Any changes to your medication since your last visit No   How long does your allergy shot last 3-4 days   Vial #1 0.4   Vial #2 0.4   Vial #3 0.1   Vial #4 0.4   Site Given Left   Given By Loletha Carrow) jh   Any Local Reaction No   Patient tolerated well Yes   Diagnosis Code all other

## 2015-07-01 ENCOUNTER — Other Ambulatory Visit: Payer: BC Managed Care – PPO

## 2015-07-01 DIAGNOSIS — J3089 Other allergic rhinitis: Secondary | ICD-10-CM

## 2015-07-01 NOTE — Progress Notes (Signed)
07/01/15 1000   ALLERGY   NEW VIAL (!) YES   Vial #1 0.4   Vial #4 0.4   Given By (Initials) ANL   Diagnosis Code all other

## 2015-07-01 NOTE — Progress Notes (Signed)
[facility] ENT & Audiology              Phone: [phone]   [address] [place], [place] [address]          Fax:     [phone]  Allergy Department Phone [phone]    Providers - K. Madelaine Bhat, MD     [place] Seamen, MD     Alvera Singh, PA-C       Danny [name] did not have systemic reaction with the previous allergy treatment vial.  Local reactions were absent with the previous immunotherapy vial.  Danny [name] is noticing positive improvement with current treatment plan.  He is still having some symptoms at this time period.  Currently [name]'s immunotherapy dosage is increasing steadily.      Serum Mixed 07/01/2015:  Payor: BLUE CROSS BLUE SHIELD / Plan: [place] BC/BS PPO / Product Type: PPO /      MAIV 5 DOSES #1 start @ 0.40, 0.45, 0.50- 1/2 VIAL MIXED     MAIV 5 DOSES #4 start @ 0.40, 0.45, 0.50- 1/2 VIAL MIXED    Allergy Serum Mixed in Normal Saline Solution  Batch # 103        ICD-10-CM    1. Other allergic rhinitis J30.89 ALLERGY SERUM MIXING ([place])        07/01/2015-Danny [name]-October 14, 1961-Payor: BLUE CROSS BLUE SHIELD / Plan: [place] BC/BS PPO / Product Type: PPO /

## 2015-07-03 ENCOUNTER — Ambulatory Visit (INDEPENDENT_AMBULATORY_CARE_PROVIDER_SITE_OTHER): Payer: BC Managed Care – PPO

## 2015-07-03 DIAGNOSIS — J3089 Other allergic rhinitis: Principal | ICD-10-CM

## 2015-07-03 NOTE — Progress Notes (Signed)
07/03/15 1500   ALLERGY   NEW VIAL (!) YES   Reaction to previous injection? No   Any fever, illness, or infection in the last 24 hours No   Any changes to your medication since your last visit No   How long does your allergy shot last 3-4 days   Vial #1 0.4   Vial #2 0.4   Vial #3 0.1   Vial #4 0.4   Site Given Left   Given By Loletha Carrow) ANL   Any Local Reaction No   Patient tolerated well Yes   Diagnosis Code all other

## 2015-07-10 ENCOUNTER — Ambulatory Visit: Payer: BC Managed Care – PPO

## 2015-07-10 DIAGNOSIS — J3089 Other allergic rhinitis: Principal | ICD-10-CM

## 2015-07-10 NOTE — Progress Notes (Signed)
07/10/15 1100   ALLERGY   NEW VIAL NO   Reaction to previous injection? No   Any fever, illness, or infection in the last 24 hours No   Any changes to your medication since your last visit No   How long does your allergy shot last 3-4 days   Vial #1 0.45   Vial #2 0.45   Vial #3 0.1   Vial #4 0.45   Site Given Left   Given By Loletha Carrow) ANL   Any Local Reaction No   Patient tolerated well Yes   Diagnosis Code all other

## 2015-07-17 ENCOUNTER — Ambulatory Visit: Payer: BC Managed Care – PPO

## 2015-07-17 DIAGNOSIS — J3089 Other allergic rhinitis: Principal | ICD-10-CM

## 2015-07-17 NOTE — Progress Notes (Signed)
07/17/15 1300   ALLERGY   NEW VIAL NO   Reaction to previous injection? No   Any fever, illness, or infection in the last 24 hours No   Any changes to your medication since your last visit No   How long does your allergy shot last 3-4 days   Vial #1 0.5   Vial #2 0.5   Vial #3 0.1   Vial #4 0.5   Site Given Left   Given By Loletha Carrow) ANL   Any Local Reaction No   Patient tolerated well Yes   Diagnosis Code all other

## 2015-07-24 ENCOUNTER — Ambulatory Visit: Payer: BC Managed Care – PPO

## 2015-07-24 DIAGNOSIS — J3089 Other allergic rhinitis: Principal | ICD-10-CM

## 2015-07-24 NOTE — Progress Notes (Signed)
07/24/15 1300   ALLERGY   NEW VIAL NO   Reaction to previous injection? No   Any fever, illness, or infection in the last 24 hours No   Any changes to your medication since your last visit No   How long does your allergy shot last 5-6 days   Vial #1 0.5   Vial #2 0.5   Vial #3 0.1   Vial #4 0.5   Site Given Left   Given By Loletha Carrow) TLK   Any Local Reaction No   Patient tolerated well Yes   Diagnosis Code all other   VIAL DRAWER #   VIAL DRAWER # 211

## 2015-07-26 ENCOUNTER — Other Ambulatory Visit: Payer: BC Managed Care – PPO

## 2015-07-26 DIAGNOSIS — J3089 Other allergic rhinitis: Principal | ICD-10-CM

## 2015-07-26 NOTE — Progress Notes (Signed)
[facility] ENT & Audiology              Phone: [phone]   [address] [place], [place] [address]          Fax:     [phone]  Allergy Department Phone [phone]    Providers - K. Madelaine Bhat, MD     [place] Seamen, MD     Alvera Singh, PA-C       Danny [name] did not have systemic reaction with the previous allergy treatment vial.  Local reactions were absent with the previous immunotherapy vial.  Danny [name] is noticing positive improvement with current treatment plan.  He is still having some symptoms at this time period.  Currently Danny [name]'s immunotherapy dosage is increasing steadily.    Serum Mixed 07/26/2015:  Payor: BLUE CROSS BLUE SHIELD / Plan: [place] STATE BC/BS PPO / Product Type: PPO /      MAIV 10 DOSES #2 start @ 0.50     SAIV 10 DOSES #3 start @ 0.10    Allergy Serum Mixed in DIL  Batch # 103        ICD-10-CM    1. Other allergic rhinitis J30.89 ALLERGY SERUM MIXING ([place])        07/26/2015-Danny [name]-Sep 06, 1961-Payor: BLUE CROSS BLUE SHIELD / Plan: [place] BC/BS PPO / Product Type: PPO /

## 2015-07-26 NOTE — Progress Notes (Signed)
07/26/15 1500   ALLERGY   NEW VIAL (!) YES   Vial #2 0.5   Vial #3 0.1   Given By (Initials) ANL   Diagnosis Code all other

## 2015-07-31 ENCOUNTER — Ambulatory Visit: Payer: BC Managed Care – PPO

## 2015-07-31 DIAGNOSIS — J3089 Other allergic rhinitis: Principal | ICD-10-CM

## 2015-07-31 NOTE — Progress Notes (Signed)
07/31/15 1400   ALLERGY   NEW VIAL (!) YES   Reaction to previous injection? No   Any fever, illness, or infection in the last 24 hours No   Any changes to your medication since your last visit No   How long does your allergy shot last 5-6 days   Vial #1 0.5   Vial #2 0.5   Vial #3 0.1   Vial #4 0.5   Site Given Left   Given By Loletha Carrow) ANL   Any Local Reaction No   Patient tolerated well Yes   Diagnosis Code all other

## 2015-08-06 ENCOUNTER — Other Ambulatory Visit: Payer: BC Managed Care – PPO

## 2015-08-06 DIAGNOSIS — J3089 Other allergic rhinitis: Secondary | ICD-10-CM

## 2015-08-06 NOTE — Progress Notes (Signed)
08/06/15 1300   ALLERGY   NEW VIAL (!) YES   Vial #1 0.1  (STRONGER)   Given By (Initials) ANL   Diagnosis Code all other

## 2015-08-06 NOTE — Progress Notes (Signed)
[facility] ENT & Audiology              Phone: [phone]   [address] [place], [place] [address]          Fax:     [phone]  Allergy Department Phone [phone]    Providers - K. Madelaine Bhat, MD     Hanley Seamen, MD     Alvera Singh, PA-C       Danny [name] did not have systemic reaction with the previous allergy treatment vial.  Local reactions were absent with the previous immunotherapy vial.  Danny [name] is noticing positive improvement with current treatment plan.  He is still having some symptoms at this time period.  Currently Danny [name]'s immunotherapy dosage is increasing steadily.      Serum Mixed 08/06/2015:  Payor: BLUE CROSS BLUE SHIELD / Plan: [place] BC/BS PPO / Product Type: PPO /      MAIV 10 DOSES #1 start @ 0.10, 0.15, 0.20, 0.25, 0.30, 0.35, 0.40, 0.45, 0.50- STRONGER       Allergy Serum Mixed in Normal Saline Solution  Batch # 103        ICD-10-CM    1. Other allergic rhinitis J30.89 ALLERGY SERUM MIXING ([place])        08/06/2015-Danny [name]-1961/05/19-Payor: BLUE CROSS BLUE SHIELD / Plan: [place] STATE BC/BS PPO / Product Type: PPO /

## 2015-08-07 ENCOUNTER — Ambulatory Visit (INDEPENDENT_AMBULATORY_CARE_PROVIDER_SITE_OTHER): Payer: BC Managed Care – PPO

## 2015-08-07 DIAGNOSIS — J3089 Other allergic rhinitis: Principal | ICD-10-CM

## 2015-08-07 NOTE — Progress Notes (Signed)
08/07/15 1200   ALLERGY   NEW VIAL (!) YES   Reaction to previous injection? No   Any fever, illness, or infection in the last 24 hours No   Any changes to your medication since your last visit No   How long does your allergy shot last 5-6 days   Vial #1 0.1   Vial #2 0.5   Vial #3 0.1   Vial #4 0.5   Site Given Left   Given By Loletha Carrow) TLK   Any Local Reaction No   Patient tolerated well Yes   Diagnosis Code all other

## 2015-08-13 ENCOUNTER — Other Ambulatory Visit: Payer: BC Managed Care – PPO

## 2015-08-13 DIAGNOSIS — J3089 Other allergic rhinitis: Secondary | ICD-10-CM

## 2015-08-13 NOTE — Progress Notes (Signed)
08/13/15 1000   ALLERGY   NEW VIAL (!) YES   Vial #4 0.5   Given By (Initials) ANL   Diagnosis Code all other

## 2015-08-13 NOTE — Progress Notes (Signed)
[facility] ENT & Audiology              Phone: [phone]   [address] [place], [place] [address]          Fax:     [phone]  Allergy Department Phone [phone]    Providers - K. Madelaine Bhat, MD     Hanley Seamen, MD     Alvera Singh, PA-C       Danny [name] did not have systemic reaction with the previous allergy treatment vial.  Local reactions were absent with the previous immunotherapy vial.  Danny [name] is noticing positive improvement with current treatment plan.  He is still having some symptoms at this time period.  Currently Danny [name]'s immunotherapy dosage is increasing steadily.      Serum Mixed 08/13/2015:  Payor: BLUE CROSS BLUE SHIELD / Plan: [place] STATE BC/BS PPO / Product Type: PPO /      MAIV 10 DOSES #4 start @ 0.50       Allergy Serum Mixed in Normal Saline Solution  Batch # 103        ICD-10-CM    1. Other allergic rhinitis J30.89 ALLERGY SERUM MIXING ([place])        08/13/2015-Danny [name]-1961-05-03-Payor: BLUE CROSS BLUE SHIELD / Plan: [place] STATE BC/BS PPO / Product Type: PPO /

## 2015-08-14 ENCOUNTER — Ambulatory Visit (INDEPENDENT_AMBULATORY_CARE_PROVIDER_SITE_OTHER): Payer: BC Managed Care – PPO

## 2015-08-14 DIAGNOSIS — J3089 Other allergic rhinitis: Principal | ICD-10-CM

## 2015-08-14 NOTE — Progress Notes (Signed)
08/14/15 1100   ALLERGY   NEW VIAL (!) YES   Reaction to previous injection? No   Any fever, illness, or infection in the last 24 hours No   Any changes to your medication since your last visit No   How long does your allergy shot last 5-6 days   Vial #1 0.15   Vial #2 0.5   Vial #3 0.1   Vial #4 0.5   Site Given Left   Given By Loletha Carrow) ANL   Any Local Reaction No   Patient tolerated well Yes   Diagnosis Code all other

## 2015-08-21 ENCOUNTER — Ambulatory Visit: Payer: BC Managed Care – PPO

## 2015-08-21 DIAGNOSIS — J3089 Other allergic rhinitis: Secondary | ICD-10-CM

## 2015-08-28 ENCOUNTER — Ambulatory Visit (INDEPENDENT_AMBULATORY_CARE_PROVIDER_SITE_OTHER): Payer: BC Managed Care – PPO

## 2015-08-28 DIAGNOSIS — J3089 Other allergic rhinitis: Secondary | ICD-10-CM

## 2015-08-28 NOTE — Progress Notes (Signed)
08/28/15 1300   ALLERGY   NEW VIAL NO   Reaction to previous injection? No   Any fever, illness, or infection in the last 24 hours No   Any changes to your medication since your last visit No   How long does your allergy shot last 5-6 days   Vial #1 0.25   Vial #2 0.5   Vial #3 0.1   Vial #4 0.5   Site Given Left   Given By Loletha Carrow) ANL   Any Local Reaction No   Patient tolerated well Yes   Diagnosis Code all other

## 2015-09-05 ENCOUNTER — Ambulatory Visit: Payer: BC Managed Care – PPO

## 2015-09-05 DIAGNOSIS — J3089 Other allergic rhinitis: Secondary | ICD-10-CM

## 2015-09-05 NOTE — Progress Notes (Signed)
09/05/15 1300   ALLERGY   NEW VIAL NO   Reaction to previous injection? No   Any fever, illness, or infection in the last 24 hours No   Any changes to your medication since your last visit No   How long does your allergy shot last 5-6 days   Vial #1 0.3   Vial #2 0.5   Vial #3 0.1   Vial #4 0.5   Site Given Left   Given By Loletha Carrow) ANL   Any Local Reaction No   Patient tolerated well Yes   Diagnosis Code all other

## 2015-09-12 ENCOUNTER — Encounter (INDEPENDENT_AMBULATORY_CARE_PROVIDER_SITE_OTHER): Payer: Self-pay | Admitting: Otolaryngology

## 2015-09-12 ENCOUNTER — Ambulatory Visit (INDEPENDENT_AMBULATORY_CARE_PROVIDER_SITE_OTHER): Payer: BC Managed Care – PPO | Admitting: Otolaryngology

## 2015-09-12 VITALS — Resp 18 | Ht 71.0 in | Wt 229.0 lb

## 2015-09-12 DIAGNOSIS — R0982 Postnasal drip: Secondary | ICD-10-CM

## 2015-09-12 DIAGNOSIS — H1010 Acute atopic conjunctivitis, unspecified eye: Secondary | ICD-10-CM

## 2015-09-12 DIAGNOSIS — J309 Allergic rhinitis, unspecified: Principal | ICD-10-CM

## 2015-09-12 DIAGNOSIS — R591 Generalized enlarged lymph nodes: Secondary | ICD-10-CM

## 2015-09-12 MED ORDER — FLUTICASONE PROPIONATE 50 MCG/ACTUATION NASAL SPRAY,SUSPENSION
1.00 | Freq: Two times a day (BID) | NASAL | Status: AC
Start: 2015-09-12 — End: ?

## 2015-09-12 MED ORDER — OLOPATADINE 0.2 % EYE DROPS
1.0000 [drp] | Freq: Every day | OPHTHALMIC | Status: DC
Start: 2015-09-12 — End: 2017-09-16

## 2015-09-12 NOTE — Progress Notes (Signed)
[facility] ENT  [address]  [place] [address]  [phone]      Date: 09/12/2015  Name: Dontea Corlew  Age: 54 y.o.  DOB:  [birth year]    Chief Complaint: Cervical Lymphadenopathy      History of Present Illness:     Keyandre Pileggi is a 54 y.o. male who presents today for a 4 month follow up of Allergic Rhinitis and Right neck lymphadenopathy.  He did get allergy tested and has started immunotherapy. He reports that ragweed is what triggers his allergy symptoms. He continues to have itchy/watery eyes, sneezing and clear nasal drainage. He is on adjunctive medications which include Claritin and Flonase. He reports occasional minor epistaxis with the Flonase. Ryheem reports the nodule in his right neck is not bothering him. It has not changed in size. He does not wish to have this biopsied at this time.     Past Medical History:     Past Medical History   Diagnosis Date    Hypertension     Elevated lipids     GERD (gastroesophageal reflux disease)              Allergies   Allergen Reactions    Percocet [Oxycodone] Anaphylaxis     Social History   Substance Use Topics    Smoking status: Never Smoker     Smokeless tobacco: Never Used    Alcohol Use: Yes        Review of Systems:     CONSTITUTIONAL: negative for fevers, chills, sweats and weight loss  RESPIRATORY: negative for hemoptysis, dyspnea on exertion or stridor  SKIN:  negative for rash and pruritus  ENT:  Negative for the remainder of the ENT review of systems except as documented in the HPI.    Physical Examination:     Resp 18   Ht 1.803 m (5\' 11" )   Wt 103.874 kg (229 lb)   BMI 31.95 kg/m2    General Appearance: pleasant, cooperative, no distress  Eyes: PERRL, Sclera non-icteric  Head and Face: Facies symmetric, no obvious lesions.  External Ears:normal pinnae shape and position  External Auditory Canal - Left: Patent without inflammation.  Tympanic Membrane - Left: intact, translucent, midposition, middle ear aerated  External Auditory  Canal - Right: Patent without inflammation.  Tympanic Membrane - Right: intact, translucent, midposition, middle ear aerated  Nose: external pyramid midline, septum midline,  mucosa normal,  no purulence,  polyps, or crusts   Oral Cavity/Oropharynx: No mucosal lesions, masses, or pharyngeal asymmetry. Mucus membranes moist.   Hypopharynx: Deferred  Neck: Trachea is midline, small 1-1.5 cm mass right neck non-tender, mobile, non-erythematous  Thyroid: no significant thyroid abnormality by palpation  Respiratory: No stridor, breathing unlabored.  Skin: Skin warm and dry  Neurologic: grossly normal   Extremities:  Moves each extremity well.  Psychiatric: Alert, Cooperative, Normal speech pattern  Procedure:     None    Data Reviewed:       Assessment and Plan:     Shloma was seen today for cervical lymphadenopathy.    Diagnoses and all orders for this visit:    AR (allergic rhinitis)  -     fluticasone (FLONASE) 50 mcg/actuation Nasal Spray, Suspension; 1 Spray by Each Nostril route Twice daily    Allergic conjunctivitis  -     Olopatadine (PATADAY) 0.2 % Ophthalmic Drops; Instill 1 Drop into both eyes Once a day    PND (post-nasal drip)  -  fluticasone (FLONASE) 50 mcg/actuation Nasal Spray, Suspension; 1 Spray by Each Nostril route Twice daily    Lymphadenopathy      Plan for a return to clinic for evaluation in 6 months, sooner should there be problems.     Toya Smothers, MD 09/12/2015, 11:58

## 2015-09-13 ENCOUNTER — Ambulatory Visit (INDEPENDENT_AMBULATORY_CARE_PROVIDER_SITE_OTHER): Payer: BC Managed Care – PPO

## 2015-09-13 DIAGNOSIS — J3089 Other allergic rhinitis: Secondary | ICD-10-CM

## 2015-09-13 NOTE — Progress Notes (Signed)
09/13/15 1100   ALLERGY   NEW VIAL NO   Reaction to previous injection? No   Any fever, illness, or infection in the last 24 hours No   Any changes to your medication since your last visit No   How long does your allergy shot last 5-6 days   Vial #1 0.35   Vial #2 0.5   Vial #3 0.1   Vial #4 0.5   Site Given Left   Given By Loletha Carrow) [facility]   Any Local Reaction No   Patient tolerated well Yes   Diagnosis Code all other

## 2015-09-19 ENCOUNTER — Ambulatory Visit: Payer: BC Managed Care – PPO

## 2015-09-19 DIAGNOSIS — J3089 Other allergic rhinitis: Principal | ICD-10-CM

## 2015-09-19 NOTE — Progress Notes (Signed)
09/19/15 1300   ALLERGY   NEW VIAL NO   Reaction to previous injection? No   Any fever, illness, or infection in the last 24 hours No   Any changes to your medication since your last visit No   How long does your allergy shot last 5-6 days   Vial #1 0.4   Vial #2 0.5   Vial #3 0.1   Vial #4 0.5   Site Given Left   Given By Loletha Carrow) [facility]   Any Local Reaction No   Patient tolerated well Yes   Diagnosis Code all other

## 2015-09-26 ENCOUNTER — Ambulatory Visit: Payer: BC Managed Care – PPO

## 2015-09-26 DIAGNOSIS — J3089 Other allergic rhinitis: Principal | ICD-10-CM

## 2015-09-26 NOTE — Progress Notes (Signed)
09/26/15 1300   ALLERGY   NEW VIAL NO   Reaction to previous injection? No   Any fever, illness, or infection in the last 24 hours No   Any changes to your medication since your last visit No   How long does your allergy shot last 5-6 days   Vial #1 0.45   Vial #2 0.5   Vial #3 0.1   Vial #4 0.5   Site Given Left   Given By Loletha Carrow) [facility]   Any Local Reaction No   Patient tolerated well Yes   Diagnosis Code all other

## 2015-10-01 ENCOUNTER — Other Ambulatory Visit: Payer: BC Managed Care – PPO

## 2015-10-01 DIAGNOSIS — J3089 Other allergic rhinitis: Secondary | ICD-10-CM

## 2015-10-01 NOTE — Progress Notes (Signed)
[facility] ENT & Audiology              Phone: [phone]   [address] [place], [place] [address]          Fax:     [phone]  Allergy Department Phone [phone]    Providers - K. Madelaine Bhat, MD     Hanley Seamen, MD     Alvera Singh, PA-C       Danny [name] did not have systemic reaction with the previous allergy treatment vial.  Local reactions were absent with the previous immunotherapy vial.  Danny [name] is noticing positive improvement with current treatment plan.  He does not have  symptoms at this time period.  Currently Danny [name]'s immunotherapy dosage is increasing steadily.    Serum Mixed 10/01/2015:  Payor: BLUE CROSS BLUE SHIELD / Plan: [place] STATE BC/BS PPO / Product Type: PPO /      MAIV 5 DOSES #1 start @ 0.45, 0.50     MAIV 5 DOSES #2 start @ 0.50    Allergy Serum Mixed in Normal Saline Solution  Batch # 103        ICD-10-CM    1. Other allergic rhinitis J30.89 ALLERGY SERUM MIXING ([place])        10/01/2015-Danny [name]-25-Jul-1961-Payor: BLUE CROSS BLUE SHIELD / Plan: [place] STATE BC/BS PPO / Product Type: PPO /

## 2015-10-01 NOTE — Progress Notes (Signed)
10/01/15 1500   ALLERGY   NEW VIAL (!) YES   Vial #1 0.45   Vial #2 0.5   Given By (Initials) ANL   Diagnosis Code all other

## 2015-10-02 ENCOUNTER — Ambulatory Visit (INDEPENDENT_AMBULATORY_CARE_PROVIDER_SITE_OTHER): Payer: BC Managed Care – PPO

## 2015-10-02 DIAGNOSIS — J3089 Other allergic rhinitis: Secondary | ICD-10-CM

## 2015-10-02 NOTE — Progress Notes (Signed)
10/02/15 1400   ALLERGY   NEW VIAL (!) YES   Reaction to previous injection? No   Any fever, illness, or infection in the last 24 hours No   Any changes to your medication since your last visit No   How long does your allergy shot last 5-6 days   Vial #1 0.45   Vial #2 0.5   Vial #3 0.1   Vial #4 0.5   Site Given Left   Given By Loletha Carrow) ANL   Any Local Reaction No   Patient tolerated well Yes   Diagnosis Code all other

## 2015-10-09 ENCOUNTER — Ambulatory Visit: Payer: BC Managed Care – PPO

## 2015-10-09 DIAGNOSIS — J3089 Other allergic rhinitis: Secondary | ICD-10-CM

## 2015-10-09 NOTE — Progress Notes (Signed)
10/09/15 1400   ALLERGY   NEW VIAL NO   Reaction to previous injection? No   Any fever, illness, or infection in the last 24 hours No   Any changes to your medication since your last visit No   How long does your allergy shot last 5-6 days   Vial #1 0.5   Vial #2 0.5   Vial #3 0.1   Vial #4 0.5   Site Given Left   Given By Loletha Carrow) [facility]   Any Local Reaction No   Patient tolerated well Yes   Diagnosis Code all other

## 2015-10-11 ENCOUNTER — Other Ambulatory Visit: Payer: BC Managed Care – PPO

## 2015-10-11 DIAGNOSIS — J3089 Other allergic rhinitis: Secondary | ICD-10-CM

## 2015-10-11 NOTE — Progress Notes (Signed)
[facility] ENT & Audiology              Phone: [phone]   [address] [place],  [address]          Fax:     [phone]  Allergy Department Phone [phone]    Providers - K. Madelaine Bhat, MD     Hanley Seamen, MD     Alvera Singh, PA-C       [name] did not have systemic reaction with the previous allergy treatment vial.  Local reactions were absent with the previous immunotherapy vial.  Pesach is noticing positive improvement with current treatment plan.  He is still having some symptoms at this time period.  Currently Eulises's immunotherapy dosage is increasing steadily.    Serum Mixed 10/11/2015:  Payor: BLUE CROSS BLUE SHIELD / Plan: [place] BC/BS PPO / Product Type: PPO /      SAIV 10 DOSES #3 start @ 0.10       Allergy Serum Mixed in DIL  Batch # 103        ICD-10-CM    1. Other allergic rhinitis J30.89 ADMIN ALLERGY INJ OR PATIENT SUPPLIED MEDICATION        10/11/2015-Raistlin Wires-[birth year]-Payor: BLUE CROSS BLUE SHIELD / Plan: [place] STATE BC/BS PPO / Product Type: PPO /

## 2015-10-11 NOTE — Progress Notes (Signed)
10/11/15 1500   ALLERGY   NEW VIAL (!) YES   Vial #3 0.1   Given By (Initials) ANL   Diagnosis Code all other

## 2015-10-18 ENCOUNTER — Ambulatory Visit: Payer: BC Managed Care – PPO

## 2015-10-18 DIAGNOSIS — J3089 Other allergic rhinitis: Secondary | ICD-10-CM

## 2015-10-18 NOTE — Progress Notes (Signed)
10/18/15 0900   ALLERGY   NEW VIAL (!) YES   Reaction to previous injection? No   Any fever, illness, or infection in the last 24 hours No   Any changes to your medication since your last visit No   How long does your allergy shot last 5-6 days   Vial #1 0.5   Vial #2 0.5   Vial #3 0.1   Vial #4 0.5   Site Given Left   Given By Loletha Carrow) ANL   Any Local Reaction No   Patient tolerated well Yes   Diagnosis Code all other

## 2015-10-28 ENCOUNTER — Ambulatory Visit: Payer: BC Managed Care – PPO

## 2015-10-28 DIAGNOSIS — J3089 Other allergic rhinitis: Secondary | ICD-10-CM

## 2015-10-28 NOTE — Progress Notes (Signed)
10/28/15 1400   ALLERGY   NEW VIAL (!) YES   Reaction to previous injection? No   Any fever, illness, or infection in the last 24 hours No   Any changes to your medication since your last visit No   How long does your allergy shot last 5-6 days   Vial #1 0.5   Vial #2 0.5   Vial #3 0.1   Vial #4 0.5   Site Given Left   Given By Loletha Carrow) [facility]   Any Local Reaction No   Patient tolerated well Yes   Diagnosis Code all other

## 2015-10-29 ENCOUNTER — Other Ambulatory Visit: Payer: BC Managed Care – PPO

## 2015-10-29 DIAGNOSIS — J3089 Other allergic rhinitis: Principal | ICD-10-CM

## 2015-10-29 NOTE — Progress Notes (Signed)
10/29/15 1600   ALLERGY   NEW VIAL (!) YES   Vial #4 0.1  (STRONGER)   Given By (Initials) ANL   Diagnosis Code all other

## 2015-10-29 NOTE — Progress Notes (Signed)
[facility] ENT & Audiology              Phone: [phone]   [address] [place], [place] [address]          Fax:     [phone]  Allergy Department Phone [phone]    Providers - K. Madelaine Bhat, MD     Hanley Seamen, MD     Alvera Singh, PA-C       Danny [name] did not have systemic reaction with the previous allergy treatment vial.  Local reactions were absent with the previous immunotherapy vial.  Edge is noticing positive improvement with current treatment plan.  He is still having some symptoms at this time period.  Currently Happy's immunotherapy dosage is increasing steadily.    Serum Mixed 10/29/2015:  Payor: BLUE CROSS BLUE SHIELD / Plan: [place] STATE BC/BS PPO / Product Type: PPO /      MAIV 10 DOSES #4 start @ 0.10, 0.15, 0.20- STRONGER, INCREASING AS TOLERATED.        Allergy Serum Mixed in Normal Saline Solution  Batch # 103        ICD-10-CM    1. Other allergic rhinitis J30.89 ALLERGY SERUM MIXING ([place])        10/29/2015-Libero Putz-02-09-1961-Payor: BLUE CROSS BLUE SHIELD / Plan: [place] STATE BC/BS PPO / Product Type: PPO /

## 2015-11-07 ENCOUNTER — Ambulatory Visit: Payer: BC Managed Care – PPO

## 2015-11-07 DIAGNOSIS — J3089 Other allergic rhinitis: Secondary | ICD-10-CM

## 2015-11-07 NOTE — Progress Notes (Signed)
11/07/15 1300   ALLERGY   NEW VIAL (!) YES   Reaction to previous injection? No   Any fever, illness, or infection in the last 24 hours No   Any changes to your medication since your last visit No   How long does your allergy shot last 5-6 days   Vial #1 0.5   Vial #2 0.5   Vial #3 0.1   Vial #4 0.1   Site Given Left   Given By Loletha Carrow) ANL   Any Local Reaction No   Patient tolerated well Yes   Diagnosis Code all other

## 2015-11-12 ENCOUNTER — Other Ambulatory Visit: Payer: BC Managed Care – PPO

## 2015-11-12 DIAGNOSIS — J3089 Other allergic rhinitis: Secondary | ICD-10-CM

## 2015-11-12 NOTE — Progress Notes (Signed)
11/12/15 1400   ALLERGY   NEW VIAL (!) YES   Vial #2 0.1  (SOME STRONGER)   Given By (Initials) TLK   VIAL DRAWER #   VIAL DRAWER # [id number]

## 2015-11-12 NOTE — Progress Notes (Signed)
[facility] ENT & Audiology              Phone: [phone]   [id number] [place], [place] [address]          Fax:     [phone]  Allergy Department Phone [phone]    Providers - K. Madelaine Bhat, MD     Hanley Seamen, MD     Alvera Singh, PA-C       Danny [name] did not have systemic reaction with the previous allergy treatment vial.  Local reactions were mild with the previous immunotherapy vial.  Danny [name] is noticing positive improvement with current treatment plan.  He is still having some symptoms at this time period.  Currently Danny [name]'s immunotherapy dosage is increasing steadily.        Serum Mixed 11/12/2015:  Payor: BLUE CROSS BLUE SHIELD / Plan: [place] STATE BC/BS PPO / Product Type: PPO /      MAIV 10 DOSES #2 start @ 0.10,0.15,..         Allergy Serum Mixed in Normal Saline Solution  Batch # 102        ICD-10-CM    1. Other allergic rhinitis J30.89 ALLERGY SERUM MIXING ([place])        11/12/2015-Danny [name]-1961/08/10-Payor: BLUE CROSS BLUE SHIELD / Plan: [place] STATE BC/BS PPO / Product Type: PPO /

## 2015-11-20 ENCOUNTER — Ambulatory Visit: Payer: BC Managed Care – PPO

## 2015-11-20 DIAGNOSIS — J3089 Other allergic rhinitis: Secondary | ICD-10-CM

## 2015-11-20 NOTE — Progress Notes (Signed)
11/20/15 1400   ALLERGY   NEW VIAL (!) YES   Reaction to previous injection? No   Any fever, illness, or infection in the last 24 hours No   Any changes to your medication since your last visit No   How long does your allergy shot last 5-6 days   Vial #1 0.5   Vial #2 0.1   Vial #3 0.1   Vial #4 0.15   Site Given Left   Given By Loletha Carrow) anl   Any Local Reaction No   Patient tolerated well Yes   Diagnosis Code all other

## 2015-11-27 ENCOUNTER — Ambulatory Visit: Payer: BC Managed Care – PPO

## 2015-11-27 DIAGNOSIS — J3089 Other allergic rhinitis: Principal | ICD-10-CM

## 2015-11-27 NOTE — Progress Notes (Signed)
11/27/15 1100   ALLERGY   NEW VIAL NO   Reaction to previous injection? No   Any fever, illness, or infection in the last 24 hours No   Any changes to your medication since your last visit No   How long does your allergy shot last 5-6 days   Vial #1 0.5   Vial #2 0.15   Vial #3 0.1   Vial #4 0.2   Site Given Right   Given By Loletha Carrow) anl   Any Local Reaction No   Patient tolerated well Yes   Diagnosis Code all other

## 2015-12-04 ENCOUNTER — Ambulatory Visit: Payer: BC Managed Care – PPO

## 2015-12-04 DIAGNOSIS — J3089 Other allergic rhinitis: Principal | ICD-10-CM

## 2015-12-04 NOTE — Progress Notes (Signed)
12/04/15 1300   ALLERGY   NEW VIAL NO   Reaction to previous injection? No   Any fever, illness, or infection in the last 24 hours No   Any changes to your medication since your last visit No   How long does your allergy shot last 5-6 days   Vial #1 0.5   Vial #2 0.2   Vial #3 0.1   Vial #4 0.25   Site Given Right   Given By Loletha Carrow) [facility]   Any Local Reaction No   Patient tolerated well Yes   Diagnosis Code all other

## 2015-12-12 ENCOUNTER — Ambulatory Visit (INDEPENDENT_AMBULATORY_CARE_PROVIDER_SITE_OTHER): Payer: BC Managed Care – PPO

## 2015-12-12 DIAGNOSIS — J3089 Other allergic rhinitis: Secondary | ICD-10-CM

## 2015-12-12 NOTE — Progress Notes (Signed)
12/12/15 1400   ALLERGY   NEW VIAL NO   Reaction to previous injection? No   Any fever, illness, or infection in the last 24 hours No   Any changes to your medication since your last visit No   How long does your allergy shot last 5-6 days   Vial #1 0.5   Vial #2 0.25   Vial #3 0.1   Vial #4 0.3   Site Given Right   Given By Loletha Carrow) [facility]   Any Local Reaction No   Patient tolerated well Yes   Diagnosis Code all other

## 2015-12-18 ENCOUNTER — Ambulatory Visit (INDEPENDENT_AMBULATORY_CARE_PROVIDER_SITE_OTHER): Payer: BC Managed Care – PPO

## 2015-12-18 DIAGNOSIS — J3089 Other allergic rhinitis: Secondary | ICD-10-CM

## 2015-12-18 NOTE — Progress Notes (Signed)
12/18/15 1000   ALLERGY   NEW VIAL NO   Reaction to previous injection? No   Any fever, illness, or infection in the last 24 hours No   Any changes to your medication since your last visit No   How long does your allergy shot last 5-6 days   Vial #1 0.5   Vial #2 0.3   Vial #3 0.1   Vial #4 0.35   Site Given Left   Given By Loletha Carrow) [facility]   Any Local Reaction No   Patient tolerated well Yes   Diagnosis Code all other

## 2015-12-19 ENCOUNTER — Encounter (INDEPENDENT_AMBULATORY_CARE_PROVIDER_SITE_OTHER): Payer: Self-pay

## 2015-12-19 ENCOUNTER — Other Ambulatory Visit: Payer: BC Managed Care – PPO

## 2015-12-19 DIAGNOSIS — J3089 Other allergic rhinitis: Secondary | ICD-10-CM

## 2015-12-19 NOTE — Progress Notes (Signed)
[facility] ENT & Audiology              Phone: [phone]   [id number] [place], [place] [address]          Fax:     [phone]  Allergy Department Phone [phone]    Providers - K. Madelaine Bhat, MD     Hanley Seamen, MD     Alvera Singh, PA-C       [name] did not have systemic reaction with the previous allergy treatment vial.  Local reactions were mild with the previous immunotherapy vial.  Josuha is noticing positive improvement with current treatment plan.  He is still having some symptoms at this time period.  Currently Kaj's immunotherapy dosage is increasing steadily.        Serum Mixed 12/19/2015:  Payor: BLUE CROSS BLUE SHIELD / Plan: [place] STATE BC/BS PPO / Product Type: PPO /      MAIV 10 DOSES #1 start @ 0.10-STRONGER         Allergy Serum Mixed in Normal Saline Solution  Batch # 104        ICD-10-CM    1. Other allergic rhinitis J30.89 ALLERGY SERUM MIXING ([place])        12/19/2015-Jancarlo Gabrielsen-1961/01/28-Payor: BLUE CROSS BLUE SHIELD / Plan: [place] BC/BS PPO / Product Type: PPO /

## 2015-12-19 NOTE — Progress Notes (Signed)
12/19/15 1500   ALLERGY   NEW VIAL (!) YES   Vial #1 0.1  (STRONGER)   Given By (Initials) TLK   VIAL DRAWER #   VIAL DRAWER # [id number]

## 2015-12-30 ENCOUNTER — Ambulatory Visit: Payer: BC Managed Care – PPO

## 2015-12-30 DIAGNOSIS — J3089 Other allergic rhinitis: Principal | ICD-10-CM

## 2015-12-30 NOTE — Progress Notes (Signed)
12/30/15 1300   ALLERGY   NEW VIAL (!) YES   Reaction to previous injection? No   Any fever, illness, or infection in the last 24 hours No   Any changes to your medication since your last visit No   How long does your allergy shot last 5-6 days   Vial #1 0.1   Vial #2 0.35   Vial #3 0.1   Vial #4 0.4   Site Given Right   Given By Loletha Carrow) [facility]   Any Local Reaction No   Patient tolerated well Yes   Diagnosis Code all other

## 2016-01-08 ENCOUNTER — Ambulatory Visit: Payer: BC Managed Care – PPO

## 2016-01-08 DIAGNOSIS — J3089 Other allergic rhinitis: Secondary | ICD-10-CM

## 2016-01-08 NOTE — Progress Notes (Signed)
01/08/16 1400   ALLERGY   NEW VIAL NO   Reaction to previous injection? No   Any fever, illness, or infection in the last 24 hours No   Any changes to your medication since your last visit No   How long does your allergy shot last 5-6 days   Vial #1 0.15   Vial #2 0.4   Vial #3 0.1   Vial #4 0.45   Site Given Right   Given By Loletha Carrow) [facility]   Any Local Reaction No   Patient tolerated well Yes   Diagnosis Code all other

## 2016-01-20 ENCOUNTER — Ambulatory Visit: Payer: BC Managed Care – PPO

## 2016-01-20 DIAGNOSIS — J3089 Other allergic rhinitis: Principal | ICD-10-CM

## 2016-01-20 NOTE — Progress Notes (Signed)
01/20/16 1300   ALLERGY   NEW VIAL NO   Reaction to previous injection? No   Any fever, illness, or infection in the last 24 hours No   Any changes to your medication since your last visit No   How long does your allergy shot last 5-6 days   Vial #1 0.2   Vial #2 0.45   Vial #3 0.1   Vial #4 0.5   Site Given Left   Given By Loletha Carrow) [facility]   Any Local Reaction No   Patient tolerated well Yes   Diagnosis Code all other

## 2016-01-28 ENCOUNTER — Other Ambulatory Visit: Payer: BC Managed Care – PPO

## 2016-01-28 DIAGNOSIS — J3089 Other allergic rhinitis: Secondary | ICD-10-CM

## 2016-01-28 NOTE — Progress Notes (Signed)
[facility] ENT & Audiology              Phone: [phone]   [id number] [place], [place] [address]          Fax:     [phone]  Allergy Department Phone [phone]    Providers - K. Madelaine Bhat, MD     Hanley Seamen, MD     Alvera Singh, PA-C       [name] did not have systemic reaction with the previous allergy treatment vial.  Local reactions were mild with the previous immunotherapy vial.  [name] is noticing positive improvement with current treatment plan.  He is still having some symptoms at this time period.  Currently [name]'s immunotherapy dosage is increasing steadily.        Serum Mixed 01/28/2016:  Payor: BLUE CROSS BLUE SHIELD / Plan: [place] BC/BS PPO / Product Type: PPO /      SAIV 10 DOSES #3 start @ 0.10         Allergy Serum Mixed in DIL.  Batch # 104        ICD-10-CM    1. Other allergic rhinitis J30.89 ALLERGY SERUM MIXING ([place])         01/28/2016-Danny [name] [place]ass[place]-12-26-1960-Payor: BLUE CROSS BLUE SHIELD / Plan: [place] STATE BC/BS PPO / Product Type: PPO /

## 2016-01-28 NOTE — Progress Notes (Signed)
01/28/16 1600   ALLERGY   NEW VIAL (!) YES   Vial #3 0.1   Given By (Initials) TLK

## 2016-01-29 ENCOUNTER — Ambulatory Visit (INDEPENDENT_AMBULATORY_CARE_PROVIDER_SITE_OTHER): Payer: BC Managed Care – PPO

## 2016-01-29 DIAGNOSIS — J3089 Other allergic rhinitis: Secondary | ICD-10-CM

## 2016-01-29 NOTE — Progress Notes (Signed)
01/29/16 1200   ALLERGY   NEW VIAL (!) YES   Reaction to previous injection? No   Any fever, illness, or infection in the last 24 hours No   Any changes to your medication since your last visit No   How long does your allergy shot last 5-6 days   Vial #1 0.25   Vial #2 0.5   Vial #3 0.1   Vial #4 0.5   Site Given Left   Given By Loletha Carrow) anl   Any Local Reaction No   Patient tolerated well Yes   Diagnosis Code all other

## 2016-02-05 ENCOUNTER — Ambulatory Visit: Payer: BC Managed Care – PPO

## 2016-02-05 DIAGNOSIS — J3089 Other allergic rhinitis: Principal | ICD-10-CM

## 2016-02-05 NOTE — Progress Notes (Signed)
02/05/16 1200   ALLERGY   NEW VIAL NO   Reaction to previous injection? No   Any fever, illness, or infection in the last 24 hours No   Any changes to your medication since your last visit No   How long does your allergy shot last 5-6 days   Vial #1 0.3   Vial #2 0.5   Vial #3 0.1   Vial #4 0.5   Site Given Left   Given By Loletha Carrow) [facility]   Any Local Reaction No   Patient tolerated well Yes   Diagnosis Code all other

## 2016-02-17 ENCOUNTER — Ambulatory Visit: Payer: BC Managed Care – PPO

## 2016-02-17 DIAGNOSIS — J3089 Other allergic rhinitis: Principal | ICD-10-CM

## 2016-02-17 NOTE — Progress Notes (Signed)
02/17/16 1300   ALLERGY   NEW VIAL NO   Reaction to previous injection? No   Any fever, illness, or infection in the last 24 hours No   Any changes to your medication since your last visit No   How long does your allergy shot last 5-6 days   Vial #1 0.35   Vial #2 0.5   Vial #3 0.1   Vial #4 0.5   Site Given Left   Given By Loletha Carrow) [facility]   Any Local Reaction No   Patient tolerated well Yes   Diagnosis Code all other

## 2016-02-28 ENCOUNTER — Ambulatory Visit: Payer: BC Managed Care – PPO

## 2016-02-28 DIAGNOSIS — J3089 Other allergic rhinitis: Secondary | ICD-10-CM

## 2016-02-28 NOTE — Progress Notes (Signed)
02/28/16 0900   ALLERGY   NEW VIAL NO   Reaction to previous injection? No   Any fever, illness, or infection in the last 24 hours No   Any changes to your medication since your last visit No   How long does your allergy shot last 5-6 days   Vial #1 0.4   Vial #2 0.5   Vial #3 0.1   Vial #4 0.5   Site Given Left   Given By Loletha Carrow) [facility]   Any Local Reaction No   Patient tolerated well Yes   Diagnosis Code all other

## 2016-03-09 ENCOUNTER — Ambulatory Visit: Payer: BC Managed Care – PPO

## 2016-03-09 DIAGNOSIS — J3089 Other allergic rhinitis: Secondary | ICD-10-CM

## 2016-03-09 NOTE — Progress Notes (Signed)
03/09/16 1300   ALLERGY   NEW VIAL NO   Reaction to previous injection? No   Any fever, illness, or infection in the last 24 hours No   Any changes to your medication since your last visit No   How long does your allergy shot last 5-6 days   Vial #1 0.45   Vial #2 0.5   Vial #3 0.1   Vial #4 0.5   Site Given Left   Given By Loletha Carrow) anl   Any Local Reaction No   Patient tolerated well Yes   Diagnosis Code all other

## 2016-03-12 ENCOUNTER — Ambulatory Visit (INDEPENDENT_AMBULATORY_CARE_PROVIDER_SITE_OTHER): Payer: BC Managed Care – PPO | Admitting: Otolaryngology

## 2016-03-12 ENCOUNTER — Encounter (INDEPENDENT_AMBULATORY_CARE_PROVIDER_SITE_OTHER): Payer: Self-pay | Admitting: Otolaryngology

## 2016-03-12 VITALS — Resp 18 | Ht 71.0 in | Wt 229.4 lb

## 2016-03-12 DIAGNOSIS — R221 Localized swelling, mass and lump, neck: Secondary | ICD-10-CM

## 2016-03-12 DIAGNOSIS — J309 Allergic rhinitis, unspecified: Secondary | ICD-10-CM

## 2016-03-12 DIAGNOSIS — Z9289 Personal history of other medical treatment: Secondary | ICD-10-CM

## 2016-03-12 NOTE — Progress Notes (Signed)
[facility] ENT  [address]  [place] [address]  [id number]      Date: 03/12/2016  Name: Danny [name]  Age: 55 y.o.  DOB:  March 13, 1961    Chief Complaint: Allergic Rhinitis (6 month follow up)    History of Present Illness:     Demarian Gillihan is a 55 y.o. male presents today to follow up on allergy immunotherapy care. Belden is currently on immunotherapy with [facility] ENT Allergy department. Allergy Injections are received every 7-10 days. Senon has not had signs of local reactions at the the injection site and denies systemic reactions. He is noticing relief when allergy shots are received on a regular basis. The relief of symptoms typically last 5-6 days. Patient is currently on adjunctive medications. These medications include nasal steroid and oral antihistamine (Claritin and Benadryl). Symptoms have included sinus and nasal congestion, sore throat, nasal blockage and post nasal drip. Control of symptoms is good at this time. Urias voices overall improvement since beginning immunotherapy. His symptoms are 80% controlled at this time. He continues with a right neck mass. This has not changed since I last saw him. He has a history of a previous negative needle biopsy.    Current allergy dosage is   Vial # DOSAGE     1 0.45     2 0.5     3 0.1   4 0.5       Past Medical History:     Past Medical History:   Diagnosis Date    Elevated lipids     GERD (gastroesophageal reflux disease)     Hypertension              Allergies   Allergen Reactions    Percocet [Oxycodone] Anaphylaxis     Social History   Substance Use Topics    Smoking status: Never Smoker    Smokeless tobacco: Never Used    Alcohol use Yes        Review of Systems:     CONSTITUTIONAL: negative for fevers, chills and sweats  RESPIRATORY: negative for cough, sputum or wheezing  SKIN:  negative for rash, skin lesion(s) and pruritus  ENT:  Negative for the remainder of the ENT review of systems except as documented in the HPI.    Physical  Examination:     Resp 18   Ht 1.803 m (5\' 11" )   Wt 104.1 kg (229 lb 6.4 oz)   BMI 31.99 kg/m2    General Appearance: pleasant, cooperative, no distress  Eyes: PERRL, Sclera non-icteric  Head and Face: Facies symmetric, no obvious lesions.  External Ears:normal pinnae shape and position  External Auditory Canal - Left: Patent without inflammation.  Tympanic Membrane - Left: intact, translucent, midposition, middle ear aerated  External Auditory Canal - Right: Patent without inflammation.  Tympanic Membrane - Right: intact, translucent, midposition, middle ear aerated  Nose: external pyramid midline, septum midline,  mucosa normal,  no purulence,  polyps, or crusts   Oral Cavity/Oropharynx: No mucosal lesions, masses, or pharyngeal asymmetry. Mucus membranes moist.   Hypopharynx: Deferred  Neck: Trachea is midline, small 1-1.5 cm mass right neck non-tender, mobile, non-erythematous  Thyroid: no significant thyroid abnormality by palpation  Respiratory: No stridor, breathing unlabored.  Skin: Skin warm and dry  Neurologic: grossly normal   Extremities:  Moves each extremity well.  Psychiatric: Alert, Cooperative, Normal speech pattern  Procedure:     None    Data Reviewed:  Assessment and Plan:     Delvante was seen today for allergic rhinitis.    Diagnoses and all orders for this visit:    AR (allergic rhinitis)    History of immunotherapy    Neck mass       The patient is currently doing well with the prescribed treatment.  We will continue treatment but stop the Benadryl. He is not concerned of the right neck lesion. It is not changing and he wishes to continue to follow the lesion. Plan for a return to clinic for evaluation in 6 months, sooner should there be problems.     Toya Smothers, MD 03/12/2016, 14:13

## 2016-03-13 ENCOUNTER — Other Ambulatory Visit: Payer: BC Managed Care – PPO

## 2016-03-13 DIAGNOSIS — J3089 Other allergic rhinitis: Principal | ICD-10-CM

## 2016-03-16 NOTE — Progress Notes (Signed)
03/16/16 1100   ALLERGY   NEW VIAL (!) YES   Vial #2 0.5   Vial #4 0.1  (STRONGER)   Given By (Initials) ANL   Diagnosis Code all other

## 2016-03-16 NOTE — Progress Notes (Signed)
[facility] ENT & Audiology              Phone: [phone]   [id number] [place], [place] [address]          Fax:     [phone]  Allergy Department Phone [phone]    Providers - K. Madelaine Bhat, MD     Hanley Seamen, MD     Alvera Singh, PA-C       Danny [name] did not have systemic reaction with the previous allergy treatment vial.  Local reactions were absent with the previous immunotherapy vial.  Danny [name] is noticing positive improvement with current treatment plan.  He is still having some symptoms at this time period.  Currently Danny [name]'s immunotherapy dosage is increasing steadily.    Serum Mixed 03/13/2016:  Payor: BLUE CROSS BLUE SHIELD / Plan: [place] STATE BC/BS PPO / Product Type: PPO /      MAIV 10 DOSES #2 start @ 0.50     MAIV 10 DOSES #4 start @ 0.10, 0.15, 0.20-STRONGER, INCREASING AS TOLERATED.    Allergy Serum Mixed in Normal Saline Solution  Batch # 105        ICD-10-CM    1. Non-seasonal allergic rhinitis due to other allergic trigger J30.89 ALLERGY SERUM MIXING ([place])     ALLERGY SERUM MIXING ([place])        03/13/2016-Danny [name]-01-07-1961-Payor: BLUE CROSS BLUE SHIELD / Plan: [place] STATE BC/BS PPO / Product Type: PPO /

## 2016-03-18 ENCOUNTER — Ambulatory Visit (INDEPENDENT_AMBULATORY_CARE_PROVIDER_SITE_OTHER): Payer: BC Managed Care – PPO

## 2016-03-18 DIAGNOSIS — J3089 Other allergic rhinitis: Secondary | ICD-10-CM

## 2016-03-18 NOTE — Progress Notes (Signed)
03/18/16 1300   ALLERGY   NEW VIAL (!) YES   Reaction to previous injection? No   Any fever, illness, or infection in the last 24 hours No   Any changes to your medication since your last visit No   How long does your allergy shot last 5-6 days   Vial #1 0.5   Vial #2 0.5   Vial #3 0.1   Vial #4 0.1   Site Given Left   Given By Loletha Carrow) ANL   Any Local Reaction No   Patient tolerated well Yes   Diagnosis Code all other

## 2016-04-06 ENCOUNTER — Ambulatory Visit: Payer: BC Managed Care – PPO

## 2016-04-06 DIAGNOSIS — J3089 Other allergic rhinitis: Principal | ICD-10-CM

## 2016-04-06 NOTE — Progress Notes (Signed)
04/06/16 1300   ALLERGY   NEW VIAL NO   Reaction to previous injection? No   Any fever, illness, or infection in the last 24 hours No   Any changes to your medication since your last visit No   How long does your allergy shot last 5-6 days   Vial #1 0.5   Vial #2 0.5   Vial #3 0.1   Vial #4 0.15   Site Given Left   Given By Loletha Carrow) [facility]   Any Local Reaction No   Patient tolerated well Yes   Diagnosis Code all other

## 2016-04-17 ENCOUNTER — Ambulatory Visit (INDEPENDENT_AMBULATORY_CARE_PROVIDER_SITE_OTHER): Payer: BC Managed Care – PPO

## 2016-04-17 DIAGNOSIS — J3089 Other allergic rhinitis: Secondary | ICD-10-CM

## 2016-04-17 NOTE — Progress Notes (Signed)
04/17/16 1000   ALLERGY   NEW VIAL NO   Reaction to previous injection? No   Any fever, illness, or infection in the last 24 hours No   Any changes to your medication since your last visit No   How long does your allergy shot last 5-6 days   Vial #1 0.5   Vial #2 0.5   Vial #3 0.1   Vial #4 0.2   Site Given Left   Given By (Initials) tlk   Any Local Reaction No   Patient tolerated well Yes   Diagnosis Code all other

## 2016-04-30 ENCOUNTER — Ambulatory Visit: Payer: BC Managed Care – PPO

## 2016-04-30 DIAGNOSIS — J3089 Other allergic rhinitis: Principal | ICD-10-CM

## 2016-04-30 NOTE — Progress Notes (Signed)
04/30/16 1000   ALLERGY   NEW VIAL NO   Reaction to previous injection? No   Any fever, illness, or infection in the last 24 hours No   Any changes to your medication since your last visit No   How long does your allergy shot last 5-6 days   Vial #1 0.5   Vial #2 0.5   Vial #3 0.1   Vial #4 0.25   Site Given Left   Given By Loletha Carrow) [facility]   Any Local Reaction No   Patient tolerated well Yes   Diagnosis Code all other   VIAL DRAWER #   VIAL DRAWER # 211

## 2016-05-08 ENCOUNTER — Ambulatory Visit: Payer: BC Managed Care – PPO

## 2016-05-08 DIAGNOSIS — J3089 Other allergic rhinitis: Secondary | ICD-10-CM

## 2016-05-08 NOTE — Progress Notes (Signed)
05/08/16 1100   ALLERGY   NEW VIAL NO   Reaction to previous injection? No   Any fever, illness, or infection in the last 24 hours No   Any changes to your medication since your last visit No   How long does your allergy shot last 5-6 days   Vial #1 0.5   Vial #2 0.5   Vial #3 0.1   Vial #4 0.3   Site Given Left   Given By Loletha Carrow) [facility]   Any Local Reaction No   Patient tolerated well Yes   Diagnosis Code all other

## 2016-05-11 ENCOUNTER — Other Ambulatory Visit: Payer: BC Managed Care – PPO

## 2016-05-11 DIAGNOSIS — J3089 Other allergic rhinitis: Secondary | ICD-10-CM

## 2016-05-11 NOTE — Progress Notes (Signed)
05/11/16 0900   ALLERGY   NEW VIAL (!) YES   Vial #1 0.1  (STRONGER)   Given By (Initials) ANL

## 2016-05-11 NOTE — Progress Notes (Signed)
[facility] ENT & Audiology              Phone: [phone]   [id number] [place], [place] [address]          Fax:     [phone]  Allergy Department Phone [phone]    Providers - K. Madelaine Bhat, MD     Hanley Seamen, MD     Alvera Singh, PA-C       Danny [name] did not have systemic reaction with the previous allergy treatment vial.  Local reactions were absent with the previous immunotherapy vial.  Danny [name] is noticing positive improvement with current treatment plan.  He is still having some symptoms at this time period.  Currently Danny [name]'s immunotherapy dosage is increasing steadily.    Serum Mixed 05/11/2016:  Payor: BLUE CROSS BLUE SHIELD / Plan: [place] STATE BC/BS PPO / Product Type: PPO /      MAIV 10 DOSES #1 start @ 0.10, 0.15, 0.20- STRONGER, 1/2 VIAL       Allergy Serum Mixed in Normal Saline Solution  Batch # 105        ICD-10-CM    1. Non-seasonal allergic rhinitis due to other allergic trigger J30.89 ALLERGY SERUM MIXING ([place])        05/11/2016-Danny [name]-Jul 25, 1961-Payor: BLUE CROSS BLUE SHIELD / Plan: [place] STATE BC/BS PPO / Product Type: PPO /

## 2016-05-21 ENCOUNTER — Ambulatory Visit: Payer: BC Managed Care – PPO

## 2016-05-21 DIAGNOSIS — J3089 Other allergic rhinitis: Secondary | ICD-10-CM

## 2016-05-21 NOTE — Progress Notes (Signed)
05/21/16 1300   ALLERGY   NEW VIAL (!) YES   Reaction to previous injection? No   Any fever, illness, or infection in the last 24 hours No   Any changes to your medication since your last visit No   How long does your allergy shot last 5-6 days   Vial #1 0.1   Vial #2 0.5   Vial #3 0.1   Vial #4 0.35   Site Given Left   Given By Loletha Carrow) ANL   Any Local Reaction No   Patient tolerated well Yes   Diagnosis Code all other

## 2016-05-26 ENCOUNTER — Other Ambulatory Visit: Payer: BC Managed Care – PPO

## 2016-05-26 DIAGNOSIS — J3089 Other allergic rhinitis: Secondary | ICD-10-CM

## 2016-05-26 NOTE — Progress Notes (Signed)
[facility] ENT & Audiology              Phone: [phone]   [id number] [place], [place] [address]          Fax:     [phone]  Allergy Department Phone [phone]    Providers - K. Madelaine Bhat, MD     Hanley Seamen, MD     Alvera Singh, PA-C       Danny [name] did not have systemic reaction with the previous allergy treatment vial.  Local reactions were absent with the previous immunotherapy vial.  [name] is noticing positive improvement with current treatment plan.  He is still having some symptoms at this time period.  Currently [name]'s immunotherapy dosage is increasing steadily.    Serum Mixed 05/26/2016:  Payor: BLUE CROSS BLUE SHIELD / Plan: [place] STATE BC/BS PPO / Product Type: PPO /      SAIV 10 DOSES #3 start @ 0.10       Allergy Serum Mixed in Normal Saline Solution  Batch # 105        ICD-10-CM    1. Non-seasonal allergic rhinitis due to other allergic trigger J30.89 ALLERGY SERUM MIXING ([place])        05/26/2016-Danny [name]-Payor: BLUE CROSS BLUE SHIELD / Plan: [place] STATE BC/BS PPO / Product Type: PPO /

## 2016-05-26 NOTE — Progress Notes (Signed)
05/26/16 1000   ALLERGY   NEW VIAL (!) YES   Vial #3 0.1   Given By (Initials) ANL

## 2016-06-01 ENCOUNTER — Ambulatory Visit: Payer: BC Managed Care – PPO

## 2016-06-01 DIAGNOSIS — J3089 Other allergic rhinitis: Secondary | ICD-10-CM

## 2016-06-12 ENCOUNTER — Ambulatory Visit (INDEPENDENT_AMBULATORY_CARE_PROVIDER_SITE_OTHER): Payer: BC Managed Care – PPO

## 2016-06-12 DIAGNOSIS — J3089 Other allergic rhinitis: Secondary | ICD-10-CM

## 2016-06-12 NOTE — Progress Notes (Signed)
06/12/16 1000   ALLERGY   NEW VIAL NO   Reaction to previous injection? No   Any fever, illness, or infection in the last 24 hours No   Any changes to your medication since your last visit No   How long does your allergy shot last 5-6 days   Vial #1 0.2   Vial #2 0.5   Vial #3 0.1   Vial #4 0.45   Site Given Left   Given By (Initials) anl   Any Local Reaction No   Patient tolerated well Yes   Diagnosis Code all other   VIAL DRAWER #   VIAL DRAWER # 211

## 2016-06-16 ENCOUNTER — Other Ambulatory Visit (INDEPENDENT_AMBULATORY_CARE_PROVIDER_SITE_OTHER): Payer: BC Managed Care – PPO

## 2016-06-16 DIAGNOSIS — J3089 Other allergic rhinitis: Secondary | ICD-10-CM

## 2016-06-16 NOTE — Progress Notes (Signed)
06/16/16 1500   ALLERGY   NEW VIAL (!) YES   Vial #4 0.45   Given By (Initials) anl

## 2016-06-16 NOTE — Progress Notes (Signed)
[facility] ENT & Audiology              Phone: [phone]   [id number] [place], [place] [address]          Fax:     [phone]  Allergy Department Phone [phone]    Providers - K. Madelaine Bhat, MD     Hanley Seamen, MD     Alvera Singh, PA-C       [name] did not have systemic reaction with the previous allergy treatment vial.  Local reactions were absent with the previous immunotherapy vial.  Jonis is noticing positive improvement with current treatment plan.  He is still having some symptoms at this time period.  Currently Dequane's immunotherapy dosage is increasing steadily.    Serum Mixed 06/16/2016:  Payor: BLUE CROSS BLUE SHIELD / Plan: [place] STATE BC/BS PPO / Product Type: PPO /      MAIV 10 DOSES #4 start @ 0.45, 0.50       Allergy Serum Mixed in Normal Saline Solution  Batch # 106        ICD-10-CM    1. Non-seasonal allergic rhinitis due to other allergic trigger J30.89 ALLERGY SERUM MIXING ([place])        06/16/2016-Jj Cicero-October 22, 1961-Payor: BLUE CROSS BLUE SHIELD / Plan: [place] STATE BC/BS PPO / Product Type: PPO /

## 2016-06-17 ENCOUNTER — Ambulatory Visit (INDEPENDENT_AMBULATORY_CARE_PROVIDER_SITE_OTHER): Payer: BC Managed Care – PPO

## 2016-06-17 DIAGNOSIS — J3089 Other allergic rhinitis: Secondary | ICD-10-CM

## 2016-06-17 NOTE — Progress Notes (Signed)
06/17/16 0900   ALLERGY   NEW VIAL (!) YES   Reaction to previous injection? No   Any fever, illness, or infection in the last 24 hours No   Any changes to your medication since your last visit No   How long does your allergy shot last 5-6 days   Vial #1 0.25   Vial #2 0.5   Vial #3 0.1   Vial #4 0.45   Site Given Left   Given By Loletha Carrow) anl   Any Local Reaction No   Patient tolerated well Yes   Diagnosis Code all other

## 2016-06-29 ENCOUNTER — Ambulatory Visit (INDEPENDENT_AMBULATORY_CARE_PROVIDER_SITE_OTHER): Payer: BC Managed Care – PPO

## 2016-06-29 DIAGNOSIS — J3089 Other allergic rhinitis: Secondary | ICD-10-CM

## 2016-06-29 NOTE — Progress Notes (Signed)
06/29/16 1300   ALLERGY   NEW VIAL NO   Reaction to previous injection? No   Any fever, illness, or infection in the last 24 hours No   Any changes to your medication since your last visit No   How long does your allergy shot last 5-6 days   Vial #1 0.3   Vial #2 0.5   Vial #3 0.1   Vial #4 0.5   Site Given Left   Given By Loletha Carrow) [facility]   Any Local Reaction No   Patient tolerated well Yes   Diagnosis Code all other   VIAL DRAWER #   VIAL DRAWER # 211

## 2016-07-06 ENCOUNTER — Other Ambulatory Visit: Payer: BC Managed Care – PPO

## 2016-07-06 DIAGNOSIS — J3089 Other allergic rhinitis: Secondary | ICD-10-CM

## 2016-07-06 NOTE — Progress Notes (Signed)
[facility] ENT & Audiology              Phone: [phone]   [id number] [place], [place] [address]          Fax:     [phone]  Allergy Department Phone [phone]    Providers - K. Madelaine Bhat, MD     Hanley Seamen, MD     Alvera Singh, PA-C       Danny [name] did not have systemic reaction with the previous allergy treatment vial.  Local reactions were absent with the previous immunotherapy vial.  Danny [name] is noticing positive improvement with current treatment plan.  He is still having some symptoms at this time period.  Currently Danny [name]'s immunotherapy dosage is increasing steadily.    Serum Mixed 07/06/2016:  Payor: BLUE CROSS BLUE SHIELD / Plan: [place] STATE BC/BS PPO / Product Type: PPO /      MAIV 10 DOSES #2 start @ 0.50       Allergy Serum Mixed in Normal Saline Solution  Batch # 107        ICD-10-CM    1. Non-seasonal allergic rhinitis due to other allergic trigger J30.89 ALLERGY SERUM MIXING ([place])     ADMIN ALLERGY INJ OR PATIENT SUPPLIED MEDICATION        07/06/2016-Danny [name]-08/09/1961-Payor: BLUE CROSS BLUE SHIELD / Plan: [place] STATE BC/BS PPO / Product Type: PPO /

## 2016-07-06 NOTE — Progress Notes (Signed)
07/06/16 1000   ALLERGY   NEW VIAL (!) YES   Vial #2 0.5   Given By (Initials) ANL

## 2016-07-13 ENCOUNTER — Ambulatory Visit: Payer: BC Managed Care – PPO

## 2016-07-13 DIAGNOSIS — J3089 Other allergic rhinitis: Secondary | ICD-10-CM

## 2016-07-13 NOTE — Progress Notes (Signed)
07/13/16 1300   ALLERGY   NEW VIAL (!) YES   Reaction to previous injection? No   Any fever, illness, or infection in the last 24 hours No   Any changes to your medication since your last visit No   How long does your allergy shot last 5-6 days   Vial #1 0.35   Vial #2 0.5   Vial #3 0.1   Vial #4 0.5   Site Given Left   Given By Loletha Carrow) [facility]   Any Local Reaction No   Patient tolerated well Yes   Diagnosis Code all other   VIAL DRAWER #   VIAL DRAWER # 211

## 2016-07-27 ENCOUNTER — Ambulatory Visit: Payer: BC Managed Care – PPO

## 2016-07-27 DIAGNOSIS — J3089 Other allergic rhinitis: Principal | ICD-10-CM

## 2016-07-27 NOTE — Progress Notes (Signed)
07/27/16 1400   ALLERGY   NEW VIAL NO   Reaction to previous injection? No   Any fever, illness, or infection in the last 24 hours No   Any changes to your medication since your last visit No   How long does your allergy shot last 5-6 days   Vial #1 0.4   Vial #2 0.5   Vial #3 0.1   Vial #4 0.5   Site Given Left   Given By Loletha Carrow) [facility]   Any Local Reaction No   Patient tolerated well Yes   Diagnosis Code all other   VIAL DRAWER #   VIAL DRAWER # 211        07/27/16 1400   ALLERGY   NEW VIAL NO   Reaction to previous injection? No   Any fever, illness, or infection in the last 24 hours No   Any changes to your medication since your last visit No   How long does your allergy shot last 5-6 days   Vial #1 0.4   Vial #2 0.5   Vial #3 0.1   Vial #4 0.5   Site Given Left   Given By Loletha Carrow) [facility]   Any Local Reaction No   Patient tolerated well Yes   Diagnosis Code all other   VIAL DRAWER #   VIAL DRAWER # 211

## 2016-08-05 ENCOUNTER — Ambulatory Visit: Payer: BC Managed Care – PPO

## 2016-08-05 DIAGNOSIS — J3089 Other allergic rhinitis: Secondary | ICD-10-CM

## 2016-08-05 NOTE — Progress Notes (Signed)
08/05/16 0900   ALLERGY   NEW VIAL NO   Reaction to previous injection? No   Any fever, illness, or infection in the last 24 hours No   Any changes to your medication since your last visit No   How long does your allergy shot last 5-6 days   Vial #1 0.45   Vial #2 0.5   Vial #3 0.1   Vial #4 0.5   Site Given Left   Given By Loletha Carrow) [facility]   Any Local Reaction No   Patient tolerated well Yes   Diagnosis Code all other

## 2016-08-13 ENCOUNTER — Ambulatory Visit: Payer: BC Managed Care – PPO

## 2016-08-13 DIAGNOSIS — J3089 Other allergic rhinitis: Principal | ICD-10-CM

## 2016-08-13 NOTE — Progress Notes (Signed)
08/13/16 0900   ALLERGY   NEW VIAL NO   Reaction to previous injection? No   Any fever, illness, or infection in the last 24 hours No   Any changes to your medication since your last visit No   How long does your allergy shot last 5-6 days   Vial #1 0.5   Vial #2 0.5   Vial #3 0.1   Vial #4 0.5   Site Given Left   Given By Loletha Carrow) anl   Any Local Reaction No   Patient tolerated well Yes   Diagnosis Code all other

## 2016-08-18 ENCOUNTER — Other Ambulatory Visit (INDEPENDENT_AMBULATORY_CARE_PROVIDER_SITE_OTHER): Payer: BC Managed Care – PPO

## 2016-08-18 DIAGNOSIS — J3089 Other allergic rhinitis: Principal | ICD-10-CM

## 2016-08-18 NOTE — Progress Notes (Signed)
[facility] ENT & Audiology              Phone: [phone]   [id number] [place], [place] [address]          Fax:     [phone]  Allergy Department Phone [phone]    Providers - K. Madelaine Bhat, MD     Hanley Seamen, MD     Alvera Singh, PA-C       Danny [name] did not have systemic reaction with the previous allergy treatment vial.  Local reactions were absent with the previous immunotherapy vial.  Danny [name] is noticing positive improvement with current treatment plan.  He is still having some symptoms at this time period.  Currently Danny [name]'s immunotherapy dosage is increasing steadily.    Serum Mixed 08/18/2016:  Payor: BLUE CROSS BLUE SHIELD / Plan: [place] STATE BC/BS PPO / Product Type: PPO /      MAIV 10 DOSES #1 start @ 0.10, 0.15, 0.20-STRONGER, INCREASING AS TOLERATED.       Allergy Serum Mixed in Normal Saline Solution  Batch # 107        ICD-10-CM    1. Non-seasonal allergic rhinitis due to other allergic trigger J30.89 ALLERGY SERUM MIXING ([place])        08/18/2016-[name]-08-Mar-1961-Payor: BLUE CROSS BLUE SHIELD / Plan: [place]HMARK/MT[place] STATE BC/BS PPO / Product Type: PPO /

## 2016-08-18 NOTE — Progress Notes (Signed)
08/18/16 1100   ALLERGY   NEW VIAL (!) YES   Vial #1 0.1  (STRONGER)   Given By (Initials) anl

## 2016-08-20 ENCOUNTER — Ambulatory Visit: Payer: BC Managed Care – PPO

## 2016-08-20 DIAGNOSIS — J3089 Other allergic rhinitis: Principal | ICD-10-CM

## 2016-08-20 NOTE — Progress Notes (Signed)
08/20/16 1300   ALLERGY   NEW VIAL (!) YES   Reaction to previous injection? No   Any fever, illness, or infection in the last 24 hours No   Any changes to your medication since your last visit No   How long does your allergy shot last 5-6 days   Vial #1 0.1   Vial #2 0.5   Vial #3 0.1   Vial #4 0.5   Site Given Left   Given By Loletha Carrow) [facility]   Any Local Reaction No   Patient tolerated well Yes   Diagnosis Code all other   VIAL DRAWER #   VIAL DRAWER # 211

## 2016-08-26 ENCOUNTER — Ambulatory Visit: Payer: BC Managed Care – PPO

## 2016-08-26 DIAGNOSIS — J3089 Other allergic rhinitis: Principal | ICD-10-CM

## 2016-08-26 NOTE — Progress Notes (Signed)
08/26/16 1100   ALLERGY   NEW VIAL NO   Reaction to previous injection? No   Any fever, illness, or infection in the last 24 hours No   Any changes to your medication since your last visit No   How long does your allergy shot last 5-6 days   Vial #1 0.15   Vial #2 0.5   Vial #3 0.1   Vial #4 0.5   Site Given Left   Given By Loletha Carrow) [facility]   Any Local Reaction No   Patient tolerated well Yes   Diagnosis Code all other

## 2016-09-02 ENCOUNTER — Ambulatory Visit: Payer: BC Managed Care – PPO

## 2016-09-02 DIAGNOSIS — J3089 Other allergic rhinitis: Principal | ICD-10-CM

## 2016-09-02 NOTE — Progress Notes (Signed)
09/02/16 1300   ALLERGY   NEW VIAL NO   Reaction to previous injection? No   Any fever, illness, or infection in the last 24 hours No   Any changes to your medication since your last visit No   How long does your allergy shot last 5-6 days   Vial #1 0.2   Vial #2 0.5   Vial #3 0.1   Vial #4 0.5   Site Given Left   Given By Loletha Carrow) [facility]   Any Local Reaction No   Patient tolerated well Yes   Diagnosis Code all other

## 2016-09-09 ENCOUNTER — Ambulatory Visit: Payer: BC Managed Care – PPO

## 2016-09-09 DIAGNOSIS — J3089 Other allergic rhinitis: Secondary | ICD-10-CM

## 2016-09-09 NOTE — Progress Notes (Signed)
09/09/16 1000   ALLERGY   NEW VIAL NO   Reaction to previous injection? No   Any fever, illness, or infection in the last 24 hours No   Any changes to your medication since your last visit No   How long does your allergy shot last 5-6 days   Vial #1 0.25   Vial #2 0.5   Vial #3 0.1   Vial #4 0.5   Site Given Left   Given By (Initials) anl   Any Local Reaction No   Patient tolerated well Yes   Diagnosis Code all other   VIAL DRAWER #   VIAL DRAWER # [id number]

## 2016-09-14 ENCOUNTER — Other Ambulatory Visit (INDEPENDENT_AMBULATORY_CARE_PROVIDER_SITE_OTHER): Payer: BC Managed Care – PPO

## 2016-09-14 DIAGNOSIS — J3089 Other allergic rhinitis: Secondary | ICD-10-CM

## 2016-09-14 NOTE — Progress Notes (Signed)
[facility] ENT & Audiology              Phone: [phone]   [id number] [place], [place] [address]          Fax:     [phone]  Allergy Department Phone [phone]    Providers - K. Madelaine Bhat, MD     Hanley Seamen, MD     Alvera Singh, PA-C       Danny [name] did not have systemic reaction with the previous allergy treatment vial.  Local reactions were absent with the previous immunotherapy vial.  Danny [name] is noticing positive improvement with current treatment plan.  He is still having some symptoms at this time period.  Currently Danny [name]'s immunotherapy dosage is increasing steadily.    Serum Mixed 09/14/2016:  Payor: BLUE CROSS BLUE SHIELD / Plan: [place] BC/BS PPO / Product Type: PPO /      SAIV 10 DOSES #3 start @ 0.10     MAIV 10 DOSES #4 start @ 0.50    Allergy Serum Mixed in Normal Saline Solution  Batch # 107        ICD-10-CM    1. Non-seasonal allergic rhinitis due to other allergic trigger J30.89 ALLERGY SERUM MIXING ([place])        09/14/2016-Jc Shark-1961-06-18-Payor: BLUE CROSS BLUE SHIELD / Plan: [place] STATE BC/BS PPO / Product Type: PPO /

## 2016-09-14 NOTE — Progress Notes (Signed)
09/14/16 0900   ALLERGY   NEW VIAL (!) YES   Vial #3 0.1   Vial #4 0.5   Given By (Initials) anl

## 2016-09-15 ENCOUNTER — Ambulatory Visit: Payer: BC Managed Care – PPO | Admitting: Otolaryngology

## 2016-09-15 ENCOUNTER — Encounter (HOSPITAL_BASED_OUTPATIENT_CLINIC_OR_DEPARTMENT_OTHER): Payer: Self-pay | Admitting: Otolaryngology

## 2016-09-15 VITALS — Resp 18 | Ht 71.0 in | Wt 229.8 lb

## 2016-09-15 DIAGNOSIS — Z9289 Personal history of other medical treatment: Secondary | ICD-10-CM

## 2016-09-15 DIAGNOSIS — Z6832 Body mass index (BMI) 32.0-32.9, adult: Secondary | ICD-10-CM

## 2016-09-15 DIAGNOSIS — R221 Localized swelling, mass and lump, neck: Secondary | ICD-10-CM

## 2016-09-15 DIAGNOSIS — J309 Allergic rhinitis, unspecified: Secondary | ICD-10-CM

## 2016-09-15 NOTE — Progress Notes (Signed)
[facility] ENT  [address]  [place] [address]  [phone]      Date: 09/15/2016  Name: Danny [name]  Age: 55 y.o.  DOB:  04/20/1961    Chief Complaint: Allergic Rhinitis    History of Present Illness:     Danny [name] is a 55 y.o. male presents today to follow up on allergy immunotherapy care. Danny [name] is currently on immunotherapy with [facility] ENT Allergy department. Allergy Injections are received every 7-9days.  Danny [name] has not had signs of local reactions at the the injection site and denies systemic reactions. He is noticing relief when allergy shots are received on a regular basis.  The relief of symptoms typically last 5-6 days. Patient is currently on adjunctive medications. These medications include Claritin and Flonase daily. He also takes Benadryl and Pataday as needed. . Symptoms have included sinus and nasal congestion, sore throat, nasal blockage and post nasal drip. Control of symptoms is good at this time. Danny [name] voices overall improvement since beginning immunotherapy. He has not noticed any changes to the small right neck mass that has been present for years. It is not bothering him.    Current allergy dosage is   Vial # DOSAGE   #1   0.25   #2 0.5   #3 0.1   #4 0.5     Past Medical History:     Past Medical History:   Diagnosis Date    Elevated lipids     GERD (gastroesophageal reflux disease)     Hypertension          Allergies   Allergen Reactions    Percocet [Oxycodone] Anaphylaxis     Social History   Substance Use Topics    Smoking status: Never Smoker    Smokeless tobacco: Never Used    Alcohol use Yes      Review of Systems:     CONSTITUTIONAL: negative for fevers, chills and sweats  RESPIRATORY: negative for cough or wheezing  SKIN:  negative for rash and dryness  ENT:  Negative for the remainder of the ENT review of systems except as documented in the HPI.    Physical Examination:     Resp 18   Ht 1.803 m (5\' 11" )   Wt 104.2 kg (229 lb 12.8 oz)   BMI 32.05 kg/m2    General  Appearance: pleasant, cooperative, no distress  Eyes: PERRL, Sclera non-icteric  Head and Face: Facies symmetric, no obvious lesions.  External Ears:normal pinnae shape and position  External Auditory Canal - Left: Patent without inflammation.  Tympanic Membrane - Left: intact, translucent, midposition, middle ear aerated  External Auditory Canal - Right: Patent without inflammation.  Tympanic Membrane - Right: intact, translucent, midposition, middle ear aerated  Nose: external pyramid midline, septum slightly left of midline,  mucosa normal,  no purulence,  polyps, or crusts   Oral Cavity/Oropharynx: No mucosal lesions, masses, or pharyngeal asymmetry. Mucus membranes moist.   Hypopharynx: Deferred  Neck: Trachea is midline, small 1-1.5 cm mass right neck non-tender, mobile, non-erythematous  Thyroid: no significant thyroid abnormality by palpation  Respiratory: No stridor, breathing unlabored.  Skin: Skin warm and dry  Neurologic: grossly normal   Extremities:  Moves each extremity well.  Psychiatric: Alert, Cooperative, Normal speech pattern  Procedure:     None    Data Reviewed:       Assessment and Plan:     Danny [name] was seen today for allergic rhinitis.    Diagnoses and all orders  for this visit:    AR (allergic rhinitis)    History of immunotherapy    Mass of right side of neck       The patient is currently doing well with the prescribed treatment.  We will continue treatment as is at this time.  Continue to monitor the right neck mass for changes.  Plan for a return to clinic for evaluation in 1 year, sooner should there be problems.     Toya Smothers, MD 09/15/2016, 13:30

## 2016-09-18 ENCOUNTER — Ambulatory Visit: Payer: BC Managed Care – PPO

## 2016-09-18 DIAGNOSIS — J3089 Other allergic rhinitis: Secondary | ICD-10-CM

## 2016-09-18 NOTE — Progress Notes (Signed)
09/18/16 1000   ALLERGY   NEW VIAL (!) YES   Reaction to previous injection? No   Any fever, illness, or infection in the last 24 hours No   Any changes to your medication since your last visit No   How long does your allergy shot last 5-6 days   Vial #1 0.3   Vial #2 0.5   Vial #3 0.1   Vial #4 0.5   Site Given Left   Given By Loletha Carrow) [facility]   Any Local Reaction No   Patient tolerated well Yes   Diagnosis Code all other   VIAL DRAWER #   VIAL DRAWER # [id number]

## 2016-09-25 ENCOUNTER — Ambulatory Visit: Payer: BC Managed Care – PPO

## 2016-09-25 DIAGNOSIS — J3089 Other allergic rhinitis: Secondary | ICD-10-CM

## 2016-09-25 NOTE — Progress Notes (Signed)
09/25/16 1000   ALLERGY   NEW VIAL NO   Reaction to previous injection? No   Any fever, illness, or infection in the last 24 hours No   Any changes to your medication since your last visit No   How long does your allergy shot last 5-6 days   Vial #1 0.35   Vial #2 0.5   Vial #3 0.1   Vial #4 0.5   Site Given Left   Given By Loletha Carrow) [facility]   Any Local Reaction No   Patient tolerated well Yes   Diagnosis Code all other   VIAL DRAWER #   VIAL DRAWER # 211

## 2016-09-29 ENCOUNTER — Other Ambulatory Visit (INDEPENDENT_AMBULATORY_CARE_PROVIDER_SITE_OTHER): Payer: BC Managed Care – PPO

## 2016-09-29 DIAGNOSIS — J301 Allergic rhinitis due to pollen: Secondary | ICD-10-CM

## 2016-09-29 DIAGNOSIS — J3089 Other allergic rhinitis: Secondary | ICD-10-CM

## 2016-09-29 NOTE — Progress Notes (Signed)
[facility] ENT & Audiology              Phone: [phone]   [id number] [place], [place] [address]          Fax:     [phone]  Allergy Department Phone [phone]    Providers - K. Madelaine Bhat, MD     Hanley Seamen, MD     Alvera Singh, PA-C       [name] did not have systemic reaction with the previous allergy treatment vial.  Local reactions were absent with the previous immunotherapy vial.  Olindo is noticing positive improvement with current treatment plan.  He is still having some symptoms at this time period.  Currently Khi's immunotherapy dosage is increasing steadily.    Serum Mixed 09/29/2016:  Payor: BLUE CROSS BLUE SHIELD / Plan: [place] BC/BS PPO / Product Type: PPO /      MAIV 5 UNITS #2 start @ 0.50       Allergy Serum Mixed in Normal Saline Solution  Batch # 107        ICD-10-CM    1. Non-seasonal allergic rhinitis due to other allergic trigger J30.89 ALLERGY SERUM MIXING ()        09/29/2016-Alrick Beitz-December 13, 1961-Payor: BLUE CROSS BLUE SHIELD / Plan: [place] BC/BS PPO / Product Type: PPO /

## 2016-09-29 NOTE — Progress Notes (Signed)
09/29/16 0900   ALLERGY   NEW VIAL (!) YES   Vial #2 0.5   Given By (Initials) ANL   VIAL DRAWER #   VIAL DRAWER # [id number]

## 2016-09-30 ENCOUNTER — Ambulatory Visit: Payer: BC Managed Care – PPO

## 2016-09-30 DIAGNOSIS — J3089 Other allergic rhinitis: Secondary | ICD-10-CM

## 2016-09-30 NOTE — Progress Notes (Signed)
09/30/16 0900   ALLERGY   NEW VIAL (!) YES   Reaction to previous injection? No   Any fever, illness, or infection in the last 24 hours No   Any changes to your medication since your last visit No   How long does your allergy shot last 5-6 days   Vial #1 0.4   Vial #2 0.5   Vial #3 0.1   Vial #4 0.5   Site Given Left   Given By Loletha Carrow) [facility]   Any Local Reaction No   Patient tolerated well Yes   Diagnosis Code all other   VIAL DRAWER #   VIAL DRAWER # 211

## 2016-10-05 ENCOUNTER — Ambulatory Visit: Payer: BC Managed Care – PPO

## 2016-10-05 DIAGNOSIS — J3089 Other allergic rhinitis: Secondary | ICD-10-CM

## 2016-10-05 NOTE — Progress Notes (Signed)
10/05/16 1300   ALLERGY   NEW VIAL NO   Reaction to previous injection? No   Any fever, illness, or infection in the last 24 hours No   Any changes to your medication since your last visit No   How long does your allergy shot last 5-6 days   Vial #1 0.45   Vial #2 0.5   Vial #3 0.1   Vial #4 0.5   Site Given Left   Given By Loletha Carrow) [facility]   Any Local Reaction No   Patient tolerated well Yes   Diagnosis Code all other   VIAL DRAWER #   VIAL DRAWER # 211

## 2016-10-09 ENCOUNTER — Other Ambulatory Visit (INDEPENDENT_AMBULATORY_CARE_PROVIDER_SITE_OTHER): Payer: BC Managed Care – PPO

## 2016-10-09 DIAGNOSIS — J3089 Other allergic rhinitis: Secondary | ICD-10-CM

## 2016-10-12 ENCOUNTER — Ambulatory Visit: Payer: BC Managed Care – PPO

## 2016-10-12 DIAGNOSIS — J3089 Other allergic rhinitis: Secondary | ICD-10-CM

## 2016-10-12 MED ORDER — EPINEPHRINE 0.3 MG/0.3 ML INJECTION, AUTO-INJECTOR
0.30 mg | AUTO-INJECTOR | Freq: Once | INTRAMUSCULAR | 0 refills | Status: AC | PRN
Start: 2016-10-12 — End: ?

## 2016-10-12 MED ORDER — SYRINGE WITH NEEDLE 1 ML 28 GAUGE X 1/2"
INJECTION | 3 refills | Status: DC
Start: 2016-10-12 — End: 2017-10-19

## 2016-10-12 MED ORDER — PROAIR HFA 90 MCG/ACTUATION AEROSOL INHALER
1.0000 | INHALATION_SPRAY | Freq: Four times a day (QID) | RESPIRATORY_TRACT | 3 refills | Status: DC | PRN
Start: 2016-10-12 — End: 2022-08-19

## 2016-10-12 NOTE — Progress Notes (Signed)
[facility] ENT & Audiology              Phone: [phone]   [id number] [place], [place] [address]          Fax:     [phone]  Allergy Department Phone [phone]    Providers - K. Madelaine Bhat, MD     Hanley Seamen, MD     Alvera Singh, PA-C       [name] did not have systemic reaction with the previous allergy treatment vial.  Local reactions were absent with the previous immunotherapy vial.  Lamondre is noticing positive improvement with current treatment plan.  He is still having some symptoms at this time period.  Currently Denvil's immunotherapy dosage is increasing steadily.    Serum Mixed 10/09/2016:  Payor: BLUE CROSS BLUE SHIELD / Plan: [place] BC/BS PPO / Product Type: PPO /      MAIV 5 UNITS #1 start @ 0.50     SAIV 1 UNIT #3 start @ 0.10  MAIV 2 UNITS #4 start @ 0.50    Allergy Serum Mixed in Normal Saline Solution  Batch # 107        ICD-10-CM    1. Non-seasonal allergic rhinitis due to other allergic trigger J30.89 ALLERGY SERUM MIXING ([place])        10/09/2016-Taariq Geier-1961/09/28-Payor: BLUE CROSS BLUE SHIELD / Plan: [place] BC/BS PPO / Product Type: PPO /

## 2016-10-12 NOTE — Progress Notes (Signed)
[facility] ENT  [place] [address]  [phone]    Danny [name] came in today 10/12/2016 for patient teaching related to home self injections.  Patient confirms that he is currently not on a beta blocker or plan to start using one in the near future due to the contraindicated use while on allergy treatment.  Patient symptoms are currently controlled for at least seven days with allergy injections and with use of other allergy medications.  The current maintenance allergy dosage for [name] is 0.5 ml for vials 1,2&4 and 0.1 ml for vial#3.     Informed consent was obtained from the patient prior initiating Patient Teaching.  [name] was carefully instructed how withdraw correct dosage from each vial using aseptic techniques.  Proper technique was also demonstrated on how to self inject serum and then safely dispose of syringe.      In the event of an allergy reaction [name] was instructed how to accurately use an Epi-Pen and to alert emergency responders after using the devise.      Emeterio [name] showed great understanding of the instructions given today 10/12/2016 and also demonstrated accuracy and good aseptic technique upon application of the instructions by self injecting his own serum under my supervision.      Allergy Vials were sent home with [name] today along with dosage sheet, and instructions to re-order serum.  Prescriptions for BD brand allergy syringe, and Epi-pen twin pack will be e-scribed to Walt's preferred pharmacy.      Attached Documents:  No scans are attached to the encounter.

## 2016-10-12 NOTE — Progress Notes (Signed)
10/12/16 0900   ALLERGY   NEW VIAL (!) YES   Vial #1 0.5   Vial #2 0.5   Vial #3 0.1   Vial #4 0.5   Given By (Initials) ANL

## 2016-10-12 NOTE — Progress Notes (Signed)
10/12/16 0900   ALLERGY   NEW VIAL (!) YES   Vial #1 0.5   Vial #3 0.1   Vial #4 0.5   Given By (Initials) ANL

## 2016-10-23 ENCOUNTER — Emergency Department
Admission: EM | Admit: 2016-10-23 | Discharge: 2016-10-23 | Disposition: A | Discharge status: Home / Self Care | Payer: BC Managed Care – PPO | Attending: Emergency Medicine | Admitting: Emergency Medicine

## 2016-10-23 ENCOUNTER — Emergency Department (HOSPITAL_COMMUNITY): Payer: BC Managed Care – PPO

## 2016-10-23 DIAGNOSIS — R079 Chest pain, unspecified: Principal | ICD-10-CM | POA: Insufficient documentation

## 2016-10-23 DIAGNOSIS — R000 Tachycardia, unspecified: Secondary | ICD-10-CM

## 2016-10-23 DIAGNOSIS — R0602 Shortness of breath: Secondary | ICD-10-CM | POA: Insufficient documentation

## 2016-10-23 DIAGNOSIS — R112 Nausea with vomiting, unspecified: Secondary | ICD-10-CM | POA: Insufficient documentation

## 2016-10-23 LAB — CBC WITH DIFF
BASOPHIL #: 0 x10ˆ3/uL (ref 0.00–0.20)
BASOPHIL %: 1 %
EOSINOPHIL #: 0 x10ˆ3/uL (ref 0.00–0.50)
EOSINOPHIL %: 0 %
HCT: 44.7 % (ref 38.9–50.5)
HGB: 15.2 g/dL (ref 13.4–17.3)
LYMPHOCYTE #: 1.6 x10ˆ3/uL (ref 0.80–3.20)
LYMPHOCYTE %: 24 %
MCH: 31.2 pg (ref 27.9–33.1)
MCHC: 34 g/dL (ref 32.8–36.0)
MCV: 91.7 fL (ref 82.4–95.0)
MONOCYTE #: 0.5 x10ˆ3/uL (ref 0.20–0.80)
MONOCYTE %: 7 %
MPV: 6.8 fL (ref 6.0–10.2)
NEUTROPHIL #: 4.5 x10ˆ3/uL (ref 1.60–5.50)
NEUTROPHIL %: 68 %
PLATELETS: 279 x10ˆ3/uL (ref 140–440)
RBC: 4.87 x10ˆ6/uL (ref 4.40–5.68)
RDW: 13 % (ref 10.9–15.1)
WBC: 6.6 x10ˆ3/uL (ref 3.3–9.3)

## 2016-10-23 LAB — BASIC METABOLIC PANEL
ANION GAP: 12 mmol/L
BUN/CREA RATIO: 11
BUN: 10 mg/dL (ref 8–20)
CALCIUM: 9.3 mg/dL (ref 8.9–10.3)
CHLORIDE: 91 mmol/L — ABNORMAL LOW (ref 101–111)
CO2 TOTAL: 20 mmol/L — ABNORMAL LOW (ref 22–32)
CREATININE: 0.91 mg/dL (ref 0.6–1.2)
ESTIMATED GFR: 87 mL/min/1.73mˆ2
GLUCOSE: 145 mg/dL — ABNORMAL HIGH (ref 70–110)
POTASSIUM: 3.5 mmol/L — ABNORMAL LOW (ref 3.6–5.1)
SODIUM: 123 mmol/L — ABNORMAL LOW (ref 136–144)

## 2016-10-23 LAB — PT/INR: INR: 1.11 — ABNORMAL HIGH (ref 0.90–1.10)

## 2016-10-23 LAB — HEPATIC FUNCTION PANEL
ALBUMIN: 4.1 g/dL (ref 3.5–4.8)
ALBUMIN: 4.1 g/dL (ref 3.5–4.8)
ALKALINE PHOSPHATASE: 92 U/L (ref 20–130)
ALT (SGPT): 56 U/L — ABNORMAL HIGH (ref 7–42)
ALT (SGPT): 56 U/L — ABNORMAL HIGH (ref 7–42)
AST (SGOT): 50 U/L — ABNORMAL HIGH (ref 12–35)
BILIRUBIN DIRECT: 0.2 mg/dL (ref 0.1–0.5)
BILIRUBIN TOTAL: 0.8 mg/dL (ref 0.3–1.2)
PROTEIN TOTAL: 6.6 g/dL (ref 6.4–8.3)

## 2016-10-23 LAB — TROPONIN-I
TROPONIN I: 0 ng/mL (ref <=0.04)
TROPONIN I: 0 ng/mL (ref <=0.04)

## 2016-10-23 LAB — LIPASE: LIPASE: 34 U/L (ref 22–51)

## 2016-10-23 MED ORDER — SODIUM CHLORIDE 0.9 % IV BOLUS
1000.0000 mL | INJECTION | Status: AC
Start: 2016-10-23 — End: 2016-10-23
  Administered 2016-10-23: 1000 mL via INTRAVENOUS
  Administered 2016-10-23: 0 mL via INTRAVENOUS

## 2016-10-23 MED ORDER — ONDANSETRON HCL (PF) 4 MG/2 ML INJECTION SOLUTION
4.0000 mg | INTRAMUSCULAR | Status: AC
Start: 2016-10-23 — End: 2016-10-23
  Administered 2016-10-23: 4 mg via INTRAVENOUS
  Filled 2016-10-23: qty 2

## 2016-10-23 MED ADMIN — amoxicillin 875 mg-potassium clavulanate 125 mg tablet: INTRAVENOUS | @ 12:00:00

## 2016-10-23 NOTE — ED Provider Notes (Signed)
[facility] Emergency Department           Encounter Diagnosis   Name Primary?    Chest pain, unspecified type Yes       ED Course/Medical Decision Making:  No distress  Presents with chest pain, nausea, vomiting  No history of heart disease; history of hypertension hyperlipidemia  Denies family history or history of smoking  Notes that recently he has had recurrent issues with vomiting over the course of the past several months  Vomits up to 3 or 4 times a week  Typically does not have chest pain associated with vomiting    This morning he began to feel sick to his stomach, associated with shortness of breath and onset of retrosternal chest pain  He vomited once at home and once after arrival to the emergency department  Since the most recent episode of vomiting he reports that his symptoms have resolved    Reassuring exam; tachycardic in triage with a 130s, but resolved on my evaluation the room  No history of or risk factors for DVT or PE  Reports history of a negative stress test many years ago    Aside from tachycardia on EKG, nothing else acute  Will screen labs, chest x-ray, treat symptomatically, reassess    15:22  Initial evaluation reassuring  After further discussion with patient, decided to obtain a right upper quadrant ultrasound was negative for acute process  Also obtained delta troponin EKG which were unremarkable  Encourage close follow-up with PCP  Discussed reasons to return at length  All questions sought and answered.    Pre-Hospital/Procedures/Consults: none      Disposition: Discharged           New Prescriptions    No medications on file       Chief Complaint:  Patient presents with     Chief Complaint   Patient presents with    Chest Pain      family rerpots chest pain for 30 min.         HPI    Danny [name], date of birth Jul 14, 1961, is a 55 y.o. male who presents to the Emergency Department with chest pain, shortness of breath, nausea and vomiting. The symptoms started this  morning, but not associated with exertion.  He notes that he has had recurrent issues with vomiting over the course of the past several months, vomiting tooth report times weekly.  Has a history of reflux and states that I took a lot of medicine to keep playing sports, and it tore my stomach up.  He denies history of heart disease, diabetes, smoking, or any family history of heart disease.  States that his father is in his 94s and has a healthy heart.  This morning, his symptoms started with nausea and vomiting and developed shortness of breath and retrosternal chest pain which he describes as a burning.  He vomited once at home once after arriving to the emergency department.  States that his symptoms have since resolved.  Denies any complaints at this time      Review of Systems   Constitutional: Negative for chills and fever.   HENT: Negative for dental problem and sore throat.    Eyes: Negative for redness and visual disturbance.   Respiratory: Positive for shortness of breath. Negative for cough.    Cardiovascular: Positive for chest pain.   Gastrointestinal: Positive for nausea and vomiting. Negative for abdominal pain.   Endocrine: Negative for polydipsia and  polyuria.   Genitourinary: Negative for difficulty urinating and dysuria.   Musculoskeletal: Negative for back pain and neck pain.   Skin: Negative for color change and rash.   Neurological: Negative for weakness and headaches.   Psychiatric/Behavioral: Negative for suicidal ideas. The patient is not nervous/anxious.      Physical Exam   Constitutional: He is oriented to person, place, and time. He appears well-developed and well-nourished. No distress.   HENT:   Head: Normocephalic and atraumatic.   Eyes: Conjunctivae and EOM are normal.   Neck: Normal range of motion. Neck supple.   Cardiovascular: Normal rate, regular rhythm, normal heart sounds and intact distal pulses.    No murmur heard.  Pulmonary/Chest: Effort normal and breath sounds normal.  No respiratory distress.   Abdominal: Soft. He exhibits no distension. There is no tenderness. There is no rebound and no guarding.   Musculoskeletal: Normal range of motion. He exhibits no edema.   Neurological: He is alert and oriented to person, place, and time.   Skin: Skin is warm and dry.   Psychiatric: He has a normal mood and affect.   Nursing note and vitals reviewed.      Vitals:  Filed Vitals:    10/23/16 1127 10/23/16 1333 10/23/16 1445   BP: (!) 170/97 (!) 155/99 (!) 159/111   Pulse: (!) 133 96 100   Resp: 18 20 15    Temp: 36.8 C (98.2 F)     SpO2: 100% 94% 99%       Past Medical History:  Diagnosis     Past Medical History:   Diagnosis Date    Elevated lipids     GERD (gastroesophageal reflux disease)     Hypertension        Past Surgical History:  Past Surgical History:   Procedure Laterality Date    Hx elbow surgery Left 2011    Hx hand surgery Left 06/2013    Hx hand surgery Left        Family History:   Family History   Problem Relation Age of Onset    Cancer Other     HTN <20 y.o. Other        Social History     Social History   Substance Use Topics    Smoking status: Never Smoker    Smokeless tobacco: Never Used    Alcohol use Yes         Social History Main Topics     History   Drug Use No           Allergies   Allergen Reactions    Percocet [Oxycodone] Anaphylaxis           Vital Signs:  Pre-disposition vitals  Filed Vitals:    10/23/16 1127 10/23/16 1333 10/23/16 1445   BP: (!) 170/97 (!) 155/99 (!) 159/111   Pulse: (!) 133 96 100   Resp: 18 20 15    Temp: 36.8 C (98.2 F)     SpO2: 100% 94% 99%       Old records reviewed by me:  None       Diagnostics:    Labs:    Results for orders placed or performed during the hospital encounter of [id number]   BASIC METABOLIC PANEL   Result Value Ref Range    SODIUM 123 (L) 136 - 144 mmol/L    POTASSIUM 3.5 (L) 3.6 - 5.1 mmol/L    CHLORIDE 91 (L) 101 - 111 mmol/L  CO2 TOTAL 20 (L) 22 - 32 mmol/L    ANION GAP 12 mmol/L    CALCIUM 9.3 8.9 -  10.3 mg/dL    GLUCOSE 145 (H) 70 - 110 mg/dL    BUN 10 8-20 mg/dL mg/dL    CREATININE 0.91 0.6-1.2 mg/dL mg/dL    BUN/CREA RATIO 11     ESTIMATED GFR 87 Avg: 93 mL/min/1.33m2   HEPATIC FUNCTION PANEL   Result Value Ref Range    ALBUMIN 4.1 3.5 - 4.8 g/dL    ALKALINE PHOSPHATASE 92 20 - 130 U/L    ALT (SGPT) 56 (H) 7 - 42 U/L    AST (SGOT) 50 (H) 12 - 35 U/L    BILIRUBIN TOTAL 0.8 0.3 - 1.2 mg/dL    BILIRUBIN DIRECT 0.2 0.1 - 0.5 mg/dL    PROTEIN TOTAL 6.6 6.4 - 8.3 g/dL   LIPASE   Result Value Ref Range    LIPASE 34 22 - 51 U/L   PT/INR   Result Value Ref Range    INR 1.11 (H) 0.90 - 1.10   TROPONIN-I   Result Value Ref Range    TROPONIN I 0.00 <=0.04 ng/mL   CBC WITH DIFF   Result Value Ref Range    WBC 6.6 3.3 - 9.3 x103/uL    RBC 4.87 4.40 - 5.68 x106/uL    HGB 15.2 13.4 - 17.3 g/dL    HCT 44.7 38.9 - 50.5 %    MCV 91.7 82.4 - 95.0 fL    MCH 31.2 27.9 - 33.1 pg    MCHC 34.0 32.8 - 36.0 g/dL    RDW 13.0 10.9 - 15.1 %    PLATELETS 279 140 - 440 x103/uL    MPV 6.8 6.0 - 10.2 fL    NEUTROPHIL % 68 %    LYMPHOCYTE % 24 %    MONOCYTE % 7 %    EOSINOPHIL % 0 %    BASOPHIL % 1 %    NEUTROPHIL # 4.50 1.60 - 5.50 x103/uL    LYMPHOCYTE # 1.60 0.80 - 3.20 x103/uL    MONOCYTE # 0.50 0.20 - 0.80 x103/uL    EOSINOPHIL # 0.00 0.00 - 0.50 x103/uL    BASOPHIL # 0.00 0.00 - 0.20 x103/uL   TROPONIN-I   Result Value Ref Range    TROPONIN I 0.00 <=0.04 ng/mL   ECG 12 LEAD - ED USE   Result Value Ref Range    Heart Rate 127 BPM    PR Interval 142 ms    QRS Duration 106 ms    QT Interval 314 ms    QTC Calculation 457 ms    Calculated P Axis 50 deg    QRS Axis 72 deg    Calculated T Axis -1 deg    I 40 Axis -17 deg    T 40 Axis 121 deg    ST Axis 28 deg    EKG Severity - BORDERLINE ECG -    ECG 12 LEAD - ED USE   Result Value Ref Range    Heart Rate 98 BPM    PR Interval 157 ms    QRS Duration 101 ms    QT Interval 344 ms    QTC Calculation 440 ms    Calculated P Axis 43 deg    QRS Axis 35 deg    Calculated T Axis 0 deg    I 40  Axis -19 deg    T 40 Axis 108 deg  ST Axis 2 deg    EKG Severity - NORMAL ECG -      Labs reviewed and interpreted by me.    Radiology:    Results for orders placed or performed during the hospital encounter of 10/23/16   XR CHEST PA AND LATERAL     Status: None    Narrative    Jeven Brem    PROCEDURE DESCRIPTION: XR CHEST PA AND LATERAL    PROCEDURE PERFORMED DATE AND TIME: 10/23/2016 12:34 PM    CLINICAL INDICATION: chest pain    TECHNIQUE: 2 views / 2 images submitted.    COMPARISON: None      FINDINGS: Two views of the chest demonstrate the lungs to be clear. The  heart size is normal. There is no infiltrate, pulmonary edema, pleural  fluid or pneumothorax.      Impression    Negative study.     US GALLBLADDER     Status: None    Narrative    Lieutenant Wombles    PROCEDURE DESCRIPTION: US GALLBLADDER    PROCEDURE PERFORMED DATE AND TIME: 10/23/2016 1:31 PM    CLINICAL INDICATION: epigastric pain, vomiting    There is a 2 mm echogenic focus of the gallbladder possibly a tiny polyp.  Gallbladder is normal otherwise with no evidence of stones, sludge or wall  thickening. There is no pericholecystic fluid or biliary dilatation. Common  duct measures 4 mm.      Impression    1. Possible tiny gallbladder polyp.  2. No evidence of gallstones or wall thickening.         EKG:  12 lead EKG interpreted by me shows sinus tachycardia, no old for comparison    Procedures:  none    Orders:  Orders Placed This Encounter    XR CHEST PA AND LATERAL    US GALLBLADDER    CBC/DIFF    BASIC METABOLIC PANEL    HEPATIC FUNCTION PANEL    LIPASE    PT/INR    TROPONIN-I    CBC WITH DIFF    TROPONIN-I    ECG 12 LEAD - ED USE    ECG 12 LEAD - ED USE    NS bolus infusion 1,000 mL    ondansetron (ZOFRAN) 2 mg/mL injection

## 2016-10-23 NOTE — ED Nurses Note (Signed)
EKG Performed by ED Tech Jelan Batterton.  EKG given to Provider Stuart Godwin.

## 2016-10-24 LAB — ECG 12 LEAD - ED USE
Calculated P Axis: 50 deg
Calculated P Axis: 43 deg
Calculated T Axis: -1 deg
Calculated T Axis: 0 deg
EKG Severity: BORDERLINE
EKG Severity: NORMAL
Heart Rate: 127 {beats}/min
Heart Rate: 98 {beats}/min
I 40 Axis: -17 deg
I 40 Axis: -19 deg
PR Interval: 142 ms
PR Interval: 157 ms
QRS Axis: 72 deg
QRS Axis: 35 deg
QRS Duration: 106 ms
QRS Duration: 101 ms
QT Interval: 314 ms
QT Interval: 344 ms
QTC Calculation: 457 ms
QTC Calculation: 440 ms
ST Axis: 28 deg
ST Axis: 2 deg
T 40 Axis: 121 deg
T 40 Axis: 108 deg

## 2016-10-26 DIAGNOSIS — Z23 Encounter for immunization: Secondary | ICD-10-CM

## 2016-10-26 DIAGNOSIS — I10 Essential (primary) hypertension: Secondary | ICD-10-CM

## 2016-10-28 ENCOUNTER — Telehealth

## 2016-10-28 NOTE — Telephone Encounter (Addendum)
Date: 10/28/2016    Patient Name: Danny [name]    Date of birth: 07/17/1961  Medical Record Number: [id number]  Primary Care Provider: Wonda Horner., MD    I spoke to Dr. Mel Almond about this patient.  The patient has chest pain with cardiac risk factors including age, obesity, hypertension, hyperlipidemia.  No family history of premature CAD.  He is not a diabetic.  He is a nonsmoker.    The insurance would not approve an exercise myocardial perfusion study.  I do believe that a stress echo is a reasonable stress modality for this particular patient based upon the history.  I have spoke to our stress echo lab at [facility]  We can perform the stress echo on Monday 11/02/2016.  I will be performing the stress echo.  I will contact Dr. Mel Almond with results.  Dr. Mel Almond has already obtain authorization through the patient's insurance.    Monia Sabal Trellis Moment, [facility]    CC: Wonda Horner., MD

## 2016-10-31 ENCOUNTER — Other Ambulatory Visit: Payer: Self-pay

## 2016-11-02 ENCOUNTER — Other Ambulatory Visit (INDEPENDENT_AMBULATORY_CARE_PROVIDER_SITE_OTHER): Payer: Self-pay | Admitting: Internal Medicine

## 2016-11-02 DIAGNOSIS — R931 Abnormal findings on diagnostic imaging of heart and coronary circulation: Secondary | ICD-10-CM | POA: Insufficient documentation

## 2016-11-02 DIAGNOSIS — I361 Nonrheumatic tricuspid (valve) insufficiency: Secondary | ICD-10-CM

## 2016-11-02 DIAGNOSIS — R079 Chest pain, unspecified: Secondary | ICD-10-CM

## 2016-11-02 DIAGNOSIS — R9439 Abnormal result of other cardiovascular function study: Secondary | ICD-10-CM | POA: Insufficient documentation

## 2016-11-02 DIAGNOSIS — I517 Cardiomegaly: Secondary | ICD-10-CM

## 2016-11-04 NOTE — Telephone Encounter (Signed)
DATE OF SERVICE: 11/02/2016      ORDERING PHYSICIAN   Dr. Mel Almond.      PROCEDURE   Stress echocardiogram.      SUBJECTIVE DATA   The patient was seen and evaluated for complaints of chest pain and pressure. He   did have a history of GERD. He describes accompanying shortness of   breath, nausea, and vomiting. He was seen in the ER at [facility]. He has accompanying risk factors of hypertension and   hyperlipidemia. Baseline EKG demonstrates sinus rhythm with an elevated heart   rate in the low 100s at 108 with no obvious ischemic EKG changes noted.      DESCRIPTION OF PROCEDURE   The patient was walked on a treadmill utilizing Bruce protocol, obtained a   maximum heart rate of [id number], maximum systolic blood pressure 123456, and maximum   diastolic blood pressure 84. He obtained 10.1 METs and walked for 6 minutes 33   seconds, which is an average exercise capacity for his age. He had no   complaints of chest pain or shortness of breath. No ischemic EKG changes were   noted. 30 seconds left in his exercise regimen, he was injected with some   contrast and he was exercised additional 30 seconds and allowed to go into   recovery and echocardiography was performed.      IMPRESSION   1. This is a normal stress echocardiogram, asymptomatic, and without EKG   changes.   2. Follow up with a stress echocardiogram imaging.   3. Dr. Trellis Moment is supervising physician for this stress test.            ________________________________   Jude Guess, NP-C     I was personally present and supervised this stress test. I interpreted the study. Normal EKG and no chest pain. Average exercise capacity. Normal exercise stress test. See separate stress echo report.     ________________________________   Deland Pretty, M.D.      DR:  JG/SD   D:  11/02/2016 09:00   T:  11/02/2016 09:09   DOCUMENT ID#:  [id number]   CC:

## 2016-11-26 ENCOUNTER — Other Ambulatory Visit (INDEPENDENT_AMBULATORY_CARE_PROVIDER_SITE_OTHER): Payer: BC Managed Care – PPO

## 2016-11-26 DIAGNOSIS — J3089 Other allergic rhinitis: Secondary | ICD-10-CM

## 2016-11-27 ENCOUNTER — Other Ambulatory Visit (INDEPENDENT_AMBULATORY_CARE_PROVIDER_SITE_OTHER): Payer: BC Managed Care – PPO

## 2016-11-27 DIAGNOSIS — J3089 Other allergic rhinitis: Principal | ICD-10-CM

## 2016-11-27 DIAGNOSIS — J309 Allergic rhinitis, unspecified: Secondary | ICD-10-CM | POA: Insufficient documentation

## 2016-11-27 NOTE — Progress Notes (Signed)
[facility]              Phone: [phone]   [id number] [place], [place] [address]          Fax:     [phone]  Allergy Department Phone [phone]      ALLERGY SERUM  - ORDER FORM       Date Order was Received :   11/20/2016     Patient Name:    Danny [name]                            DOB: 02-04-1961  Pickup: No - Please Mail - HOME    Call - Phone # [phone]    INSURANCE:(Copy of card required if coverage has changed.) Home MAILING ADDRESS FOR VIALS   Primary:   Payor: [place] / Plan: [place]IG[place]ARK/[place] STATE BC/BS PPO / Product Type: PPO /   Danny [name]   [address]  [place] [address]     Where do you receive your allergy injections? HOME    Any systemic or major local reactions with previous vial?  No    Are you currently taking oral allergy medications or nose sprays in addition to your allergy injections?  Yes  Noticed any improvement with previous vial?  Stable compared to previous vials  Are you currently on a beta-blocker? No   List of Danny [name] medications:    Outpatient Medications Prior to Visit:  aspirin 81 mg Oral Tablet, Chewable Take 81 mg by mouth Once a day   atorvastatin (LIPITOR) 40 mg Oral Tablet Take 40 mg by mouth Every night   cholecalciferol, vitamin D3, 1,000 unit Oral Tablet Take 1,000 Units by mouth Once a day   Cinnamon Bark 500 mg Oral Capsule Take 2,000 mg by mouth   coenzyme Q10 30 mg Oral Capsule Take 200 mg by mouth Every evening with dinner    Colesevelam (WELCHOL) 3.75 gram Oral Powder in Packet Take 3.75 g by mouth Once a day   diphenhydrAMINE (BENADRYL) 25 mg Oral Capsule Take 25 mg by mouth Every 6 hours as needed   EPINEPHrine (EPIPEN) 0.3 mg/0.3 mL Injection Auto-Injector 0.3 mL (0.3 mg total) by Intramuscular route Once, as needed for up to 1 dose   FERROUS FUMARATE/VIT BCOMP,C (SUPER B COMPLEX ORAL) Take by mouth   Fluoxetine (PROZAC) 40 mg Oral Capsule Take 40 mg by mouth Once a day HVL   fluticasone (FLONASE) 50  mcg/actuation Nasal Spray, Suspension 1 Spray by Each Nostril route Twice daily   LACTOBACILLUS ACIDOPHILUS (PROBIOTIC ORAL) Take by mouth   lansoprazole (PREVACID SOLUTAB) 15 mg Oral Tablet,Rapid Dissolve, DR Take 15 mg by mouth Once a day   loratadine (CLARITIN) 10 mg Oral Tablet Take 10 mg by mouth Once a day   multivitamin Oral Tablet Take 1 Tab by mouth Once a day   Olmesartan (BENICAR) 40 mg Oral Tablet Take 40 mg by mouth Once a day   Olopatadine (PATADAY) 0.2 % Ophthalmic Drops Instill 1 Drop into both eyes Once a day   PROAIR HFA 90 mcg/actuation Inhalation HFA Aerosol Inhaler Take 1-2 Puffs by inhalation Every 6 hours as needed   Syringe with Needle, Disp, (BD ALLERGY SYRINGE) 1 mL 28 gauge x 1/2" Syringe Use 2 syringes weekly for allergy injections   turmeric root extract 500 mg Oral Capsule Take 500 mg by mouth  No facility-administered medications prior to visit.   See above list of meds for adjunct medication currently taking in conjunction with allergy injections.  SERUM requested:  Vial # FREQUENCY DATE LAST Injection DOSAGE   4   Every 7 days 11/30 0.50                       Allergy Serum Mixed on 11/26/2016 by [name]:   Payor: [place] / Plan: [place] STATE BC/BS PPO / Product Type: PPO /     MAIV 5 UNITS #4 start @ 0.40, 0.50      Allergy Serum Mixed in NSS  Mixing Board Batch #: [id number]    Vials and dosage instructions will be mailed to [name] at:  [address]  [place] [address] or the address of the patients preferred medical facility.    Or Danny [name] will be notified to pick up (([phone])      11/26/2016-Danny [name]-[birth year]-Payor: [employer]UE [employer]OSS BLUE SHIELD / Plan: [place] STATE BC/BS PPO / Product Type: PPO /

## 2016-11-27 NOTE — Progress Notes (Signed)
[facility] ENT & Audiology              Phone: [phone]   [phone] [place], [place] [address]          Fax:     [phone]  Allergy Department Phone [phone]      ALLERGY SERUM  - ORDER FORM       Date Order was Received :   11/20/2016     Patient Name:    Danny [name]                            DOB: 08-29-1961  Pickup: No - Please Mail - home    Call - Phone # [phone]    INSURANCE:(Copy of card required if coverage has changed.) Home MAILING ADDRESS FOR VIALS   Primary:   Payor: [place] / Plan: [place] STATE BC/BS PPO / Product Type: PPO /   Axcel Bhagat   [address]  [place] [address]     Where do you receive your allergy injections? home    Any systemic or major local reactions with previous vial?  no    Are you currently taking oral allergy medications or nose sprays in addition to your allergy injections?  Yes  Noticed any improvement with previous vial?  Stable compared to previous vials  Are you currently on a beta-blocker? No   List of Harlee's medications:    Outpatient Medications Prior to Visit:  aspirin 81 mg Oral Tablet, Chewable Take 81 mg by mouth Once a day   atorvastatin (LIPITOR) 40 mg Oral Tablet Take 40 mg by mouth Every night   cholecalciferol, vitamin D3, 1,000 unit Oral Tablet Take 1,000 Units by mouth Once a day   Cinnamon Bark 500 mg Oral Capsule Take 2,000 mg by mouth   coenzyme Q10 30 mg Oral Capsule Take 200 mg by mouth Every evening with dinner    Colesevelam (WELCHOL) 3.75 gram Oral Powder in Packet Take 3.75 g by mouth Once a day   diphenhydrAMINE (BENADRYL) 25 mg Oral Capsule Take 25 mg by mouth Every 6 hours as needed   EPINEPHrine (EPIPEN) 0.3 mg/0.3 mL Injection Auto-Injector 0.3 mL (0.3 mg total) by Intramuscular route Once, as needed for up to 1 dose   FERROUS FUMARATE/VIT BCOMP,C (SUPER B COMPLEX ORAL) Take by mouth   Fluoxetine (PROZAC) 40 mg Oral Capsule Take 40 mg by mouth Once a day HVL   fluticasone (FLONASE) 50  mcg/actuation Nasal Spray, Suspension 1 Spray by Each Nostril route Twice daily   LACTOBACILLUS ACIDOPHILUS (PROBIOTIC ORAL) Take by mouth   lansoprazole (PREVACID SOLUTAB) 15 mg Oral Tablet,Rapid Dissolve, DR Take 15 mg by mouth Once a day   loratadine (CLARITIN) 10 mg Oral Tablet Take 10 mg by mouth Once a day   multivitamin Oral Tablet Take 1 Tab by mouth Once a day   Olmesartan (BENICAR) 40 mg Oral Tablet Take 40 mg by mouth Once a day   Olopatadine (PATADAY) 0.2 % Ophthalmic Drops Instill 1 Drop into both eyes Once a day   PROAIR HFA 90 mcg/actuation Inhalation HFA Aerosol Inhaler Take 1-2 Puffs by inhalation Every 6 hours as needed   Syringe with Needle, Disp, (BD ALLERGY SYRINGE) 1 mL 28 gauge x 1/2" Syringe Use 2 syringes weekly for allergy injections   turmeric root extract 500 mg Oral Capsule Take 500 mg by mouth  No facility-administered medications prior to visit.   See above list of meds for adjunct medication currently taking in conjunction with allergy injections.  SERUM requested:  Vial # FREQUENCY DATE LAST Injection DOSAGE   1   Every 7 days 11/30 0.50   2     0.50   3     0.10       Allergy Serum Mixed on 11/27/2016 by [name]:   Payor: [place] / Plan: [place] STATE BC/BS PPO / Product Type: PPO /     MAIV 5 UNITS #1 start @ 0.40, 0.50  MAIV 5 UNITS #2 start @ 0.40, 0.50  SAIV 1 UNIT #3 start @ 0.10,     Allergy Serum Mixed in NSS  Mixing Board Batch #: [id number]    Vials and dosage instructions will be mailed to [place] at:  [address]  [place] [address] or the address of the patients preferred medical facility.    Or Carmine will be notified to pick up (([phone])      11/27/2016-Bonifacio Kosmicki-1961/05/29-Payor: BLUE CROSS BLUE SHIELD / Plan: [place] STATE BC/BS PPO / Product Type: PPO /

## 2017-01-11 DIAGNOSIS — I10 Essential (primary) hypertension: Secondary | ICD-10-CM

## 2017-01-12 ENCOUNTER — Other Ambulatory Visit (HOSPITAL_COMMUNITY): Payer: Self-pay

## 2017-01-12 DIAGNOSIS — R413 Other amnesia: Secondary | ICD-10-CM

## 2017-01-18 ENCOUNTER — Ambulatory Visit (INDEPENDENT_AMBULATORY_CARE_PROVIDER_SITE_OTHER): Payer: BC Managed Care – PPO | Admitting: Clinical Neuropsychologist

## 2017-01-18 DIAGNOSIS — F1020 Alcohol dependence, uncomplicated: Secondary | ICD-10-CM

## 2017-01-18 DIAGNOSIS — R413 Other amnesia: Secondary | ICD-10-CM

## 2017-01-20 NOTE — Progress Notes (Signed)
NEUROPSYCHOLOGICAL EVALUATION    PATIENT NAME:  Delco NUMBER: [id number]  DATE OF SERVICE:  01/18/2017  DATE OF BIRTH:  1961-12-20  TIME IN/OUT:  8:15/10:48 a.m.   CPT Codes:    [phone] = 3 hours (8:15/8:53, interview and evaluation, +2-hour record review, integration, report); [phone] = 2 hours (Technician: EPB, 8:53/10:48 a.m.)     January 19, 2017     Ulyses Southward, MD   [place], [place] [address]     Dear Dr. Mel Almond.     As requested, I saw this 56 year old, right-handed, Caucasian male for a neuropsychological evaluation in the context of reported cognitive decline.  As you know, this patient's history is remarkable for hypertension, hyperlipidemia, and GERD.  Family history is remarkable for dementia (father, diagnosed in his 42s).     Regarding his current cognitive functioning, the patient reported short-term memory difficulties such as forgetting details from conversation and recent events as well as forgetting tasks to complete.  He denied any attentional difficulties and reported being very detail oriented.  He did note slowed processing speed which he attributed to age and sleep difficulties.  He denied any changes in his decision-making, visuospatial abilities, or language functioning.  He reported that he manages his ADLs independently, including medication and financial management and denied difficulties driving.     Regarding his current mood, the patient reported a longstanding history of anxiety, though he reported that Prozac has been helpful in reducing these symptoms.  He denied symptoms of depression and denied participating in therapy or counseling currently or in the past.  He reported some sleep difficulties as he awakens during the night and cannot fall back asleep, but is unsure why.  Despite this, he reported having good energy throughout the day.  He denied changes in his appetite.  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  Thoughts of self-harm were denied.  The patient graduated from college with a degree in computer programming and denied any issues throughout his early education.  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  He currently resides with his wife and also has 2 children and reported having a good support system in place.     BEHAVIORAL OBSERVATIONS:  The patient was fully awake and alert throughout the evaluation.  Movements and speech were normal.  Mood was generally euthymic though he was self-deprecating at times.  He was also somewhat impulsive in responding.  Attentional difficulties were noted behaviorally as he would sometimes request task instructions to be repeated and would also check his cell phone.  Results from embedded measures of performance validity suggest that he was adequately engaged and the following results were thought to be an accurate representation of his current cognitive abilities.       NEUROBEHAVIORAL/COGNITIVE RESULTS:  Neurobehavioral exam revealed intact orientation.  Grip strength was normal though he was stimulus bound as he mimicked the examiner.  Pronator drift and finger-to-nose were normal.  Rapid alternating hand and finger movements were normal though he was stimulus bound on the latter task.  There were no extinctions to simple or complex double simultaneous tactile stimulation.  Right/left orientation was intact though he self-corrected.  Grasp reflex was normal, there was no cogwheeling noted, and glabellar reflex could be appropriately extinguished.  Visual fields were normal and there were no extinctions to double visual stimulation.  His copy of loops and a cross was normal and he evidenced some perseveration when copying ramparts.  No hemispatial inattention was noted on line bisection.  He was able to provide the days of the week and the months of the year in the forward and backward positions.   When learning a new motor sequence, he evidenced perseveration bilaterally as he continued past the instructed number of times.  He was intermittently stimulus bound bilaterally on a go/no-go task.  He was able to follow 3-step conditional commands with midline crossing without difficulty.  Comprehension and repetition were intact.  He initially made 1 body part for object substitution error, which corrected upon transfer.     The patient's estimated intellectual functioning was in the average range (WASI-2 FSIQ = 100, 50th percentile) and characterized by average verbal comprehension (VCI = 96, 39th percentile) and average perceptual reasoning abilities (PRI = 105, 63rd percentile).  Single word reading recognition and vocabulary were both average.  Phonemic fluency was average and semantic fluency was low average.  Confrontation naming was intact.  Focused auditory attention and concentration was low average.  His ability to perform mathematical calculations mentally was average.  General cognitive efficiency for speeded word reading was average and speeded color naming was low average and he evidenced mild inhibition difficulties on interference task.  Graphomotor processing speed and graphomotor set shifting were both average; however, his performance on all latter task was reduced in comparison to the former task and he also made several errors.  Visuospatial skills were intact based on his average performance constructing blocks to match a figure.  He evidenced some inattention to detail in planning difficulties on a figure copying task.  Verbal and nonverbal abstract reasoning were average.  On a novel problem-solving task, he made relatively few errors, suggesting the intact ability to conceptualize the rules of the task and modify his responses based on feedback he received regarding his performance. That being said, he did fail to maintain set on 2 occasions suggestive of inattention and/or reduced  self-monitoring.     On a verbal learning through repetition task, his performance across the trials was in the borderline impaired range (5, 5, 6, 6, 8 out of 16 words).  Following a distractor list, free recall was borderline impaired; however, when provided with cues, performance significantly improved to the average range where he actually provided more words than he did across the learning trials.  Following a long delay, recall performances were low average where he demonstrated intact retrieval.  When presented in recognition format, performance was impaired as he made several false-positive errors and he also made more intrusions than expected over all trials of the task, both of which are suggestive of inattention and/or reduced self-monitoring.  On a contextual based memory task, his immediate memory performance was low average.  However, he demonstrated significant retrieval difficulties following a delay and his performance was in the extremely low range.  Recognition was generally intact.  On a visual learning through repetition task, his performance was in the borderline impaired range across the trials (3, 6, 6 out of 12) and performance was low average following a delay where he provided all the information he initially encoded.  Recognition was intact.  On self-report measures, he evidenced moderate symptoms of anxiety and mild symptoms of depression.  Thoughts of self-harm were denied.       IMPRESSIONS: In conclusion, results from the current neuropsychological evaluation revealed that this individual maintains an average level  of intellectual functioning.  Cognitive testing revealed variable executive functions evidenced by deficits related to graphomotor set shifting, inhibition difficulties, reduced self-monitoring, and frontal inefficiencies (stimulus bound and perseverative responding) noted during the neurobehavioral exam.  Verbal learning and memory performances revealed variable encoding,  retrieval and consolidation.  Retrieval did improve when cues were provided and retrieval was improved for information provided in piecemeal format rather than contextual support.  This may suggest an executive component as well as he may have difficulty organizing information and becoming overwhelmed when a larger amount of information is provided at once.  Visual learning and memory revealed deficits related to encoding, though with intact retrieval and consolidation.  While focused auditory attention was generally intact on testing, I suspect that fluctuations in attention likely contributed to these variable performances.     The etiology of this patient's reported difficulties in those variable and reduced performance on testing is likely multifactorial.  Given completely intact functional ability, the presence of a neurodegenerative process such as dementia does not appear likely at this given time.  I would suspect that the fluctuations in attention are a contributor to those performances noted above; however, additional contributory factors include his vascular risk factors which are known to contribute to fluctuations in cognition and attention.  He should continue treating these diagnoses both pharmacologically and behaviorally through adequate nutrition and exercise.  The patient also does consume excessive alcohol use daily which may be contributing to the difficulties noted above, at least to some extent.  Prolonged and excessive alcohol use has been linked to deficits related to executive functioning and attention, and from both cognitive and medical perspectives, this patient should take steps to reduce his alcohol intake to avoid decline or compromise in the future.  There is likely an emotional component as well given his reported moderate symptoms of anxiety and mild depression noted on the self-reported measures.  The alcohol use may also be serving as a maladaptive coping mechanism for dealing  with his distress.  Emotional symptoms alone are known to contribute to fluctuations in attention and they are likely contributing to some extent as well and the patient should consider initiating a form of behavioral intervention.  Counseling or therapy may be helpful in assisting him develop adaptive coping mechanisms for dealing with distress.      The patient would also benefit from implementing compensatory strategies to offset his perceived difficulties.  For example, he should document important information which he can refer to later rather than attempting to commit and store this information in memory.  He should also improve structure in his daily activities as a way of forming a routine that he can follow regularly.  When faced with complex tasks, he should consider breaking these tasks down into smaller ones to assist with task completion and prevent him from becoming overwhelmed.  While there is no clear suggestion of a dementia process at this time, it is recommended that this patient be re-evaluated in 1-2 years to evaluate any changes, or sooner if clinically warranted or if there is any further decline or the emergence of any functional difficulties.  If so desired, I am happy to meet with this patient and provide feedback with the results along with my impressions and recommendations and also suggest additional compensatory strategies at that time.       Thank you for involving me in the care of this patient.  If there is any additional information I can provide, please do not hesitate  to contact me.       Sincerely,      Haywood Filler, PhD  Assistant Professor    Department of Behavioral Medicine and Psychiatry      CC:   Wonda Horner, MD   Fax: [phone]       DD:  01/19/2017 14:24:42  DT:  01/20/2017 08:56:28 NW  D#:  [id number]

## 2017-02-04 ENCOUNTER — Other Ambulatory Visit (INDEPENDENT_AMBULATORY_CARE_PROVIDER_SITE_OTHER): Payer: BC Managed Care – PPO

## 2017-02-04 DIAGNOSIS — J3089 Other allergic rhinitis: Secondary | ICD-10-CM

## 2017-02-05 ENCOUNTER — Other Ambulatory Visit (INDEPENDENT_AMBULATORY_CARE_PROVIDER_SITE_OTHER): Payer: BC Managed Care – PPO

## 2017-02-05 DIAGNOSIS — J3089 Other allergic rhinitis: Secondary | ICD-10-CM

## 2017-02-05 NOTE — Nursing Note (Signed)
[facility]NT & Audiology              Phone: [phone]   [id number] [place], [place] [address]          Fax:     [phone]  Allergy Department Phone [phone]      ALLERGY SERUM  - ORDER FORM       Date Order was Received :   02/05/2017     Patient Name:    Danny [name]                            DOB: [birth year]  Pickup: yes    Call - Phone # [phone]    INSURANCE:(Copy of card required if coverage has changed.) Home MAILING ADDRESS FOR VIALS   Primary:   Payor: [place] / Plan: HIGHMARK/MTN STATE BC/BS PPO / Product Type: PPO /   Danny [name]   [address]  [place] [address]     Where do you receive your allergy injections? HOME    Any systemic or major local reactions with previous vial?  No    Are you currently taking oral allergy medications or nose sprays in addition to your allergy injections?  Yes  Noticed any improvement with previous vial?  Stable compared to previous vials  Are you currently on a beta-blocker? No   List of Danny [name]'s medications:    Outpatient Medications Prior to Visit:  aspirin 81 mg Oral Tablet, Chewable Take 81 mg by mouth Once a day   atorvastatin (LIPITOR) 40 mg Oral Tablet Take 40 mg by mouth Every night   cholecalciferol, vitamin D3, 1,000 unit Oral Tablet Take 1,000 Units by mouth Once a day   Cinnamon Bark 500 mg Oral Capsule Take 2,000 mg by mouth   coenzyme Q10 30 mg Oral Capsule Take 200 mg by mouth Every evening with dinner    Colesevelam (WELCHOL) 3.75 gram Oral Powder in Packet Take 3.75 g by mouth Once a day   diphenhydrAMINE (BENADRYL) 25 mg Oral Capsule Take 25 mg by mouth Every 6 hours as needed   EPINEPHrine (EPIPEN) 0.3 mg/0.3 mL Injection Auto-Injector 0.3 mL (0.3 mg total) by Intramuscular route Once, as needed for up to 1 dose   FERROUS FUMARATE/VIT BCOMP,C (SUPER B COMPLEX ORAL) Take by mouth   Fluoxetine (PROZAC) 40 mg Oral Capsule Take 40 mg by mouth Once a day HVL   fluticasone (FLONASE) 50 mcg/actuation Nasal  Spray, Suspension 1 Spray by Each Nostril route Twice daily   LACTOBACILLUS ACIDOPHILUS (PROBIOTIC ORAL) Take by mouth   lansoprazole (PREVACID SOLUTAB) 15 mg Oral Tablet,Rapid Dissolve, DR Take 15 mg by mouth Once a day   loratadine (CLARITIN) 10 mg Oral Tablet Take 10 mg by mouth Once a day   multivitamin Oral Tablet Take 1 Tab by mouth Once a day   Olmesartan (BENICAR) 40 mg Oral Tablet Take 40 mg by mouth Once a day   Olopatadine (PATADAY) 0.2 % Ophthalmic Drops Instill 1 Drop into both eyes Once a day   PROAIR HFA 90 mcg/actuation Inhalation HFA Aerosol Inhaler Take 1-2 Puffs by inhalation Every 6 hours as needed   Syringe with Needle, Disp, (BD ALLERGY SYRINGE) 1 mL 28 gauge x 1/2" Syringe Use 2 syringes weekly for allergy injections   turmeric root extract 500 mg Oral Capsule Take 500 mg by mouth     No facility-administered  medications prior to visit.   See above list of meds for adjunct medication currently taking in conjunction with allergy injections.  SERUM requested:  Vial # FREQUENCY DATE LAST Injection DOSAGE   1   Every 7 days  0.50   2     0.50   3     0.10       Allergy Serum Mixed on 02/05/2017 by Jonelle Sidle Knotts:   Payor: [place] / Plan: [place] STATE BC/BS PPO / Product Type: PPO /     MAIV 5 UNITS #1 start @ 0.40, 0.50  MAIV 5 UNITS #2 start @ 0.40, 0.50  SAIV 1 UNIT #3 start @ 0.10,     Allergy Serum Mixed in NSS  Mixing Board Batch #: [id number]    Vials and dosage instructions will be mailed to [facility] at:  [address]   [place] [address] or the address of the patients preferred medical facility.    Or [facility] will be notified to pick up (([phone])      02/05/2017-Danny [name]-[birth year]-Payor: BLUE CROSS BLUE SHIELD / Plan: [place] STATE BC/BS PPO / Product Type: PPO /

## 2017-02-05 NOTE — Nursing Note (Signed)
[facility] ENT & Audiology              Phone: [phone]   [id number] [place], [place] [address]          Fax:     [phone]  Allergy Department Phone [phone]      ALLERGY SERUM  - ORDER FORM       Date Order was Received :   02/04/2017     Patient Name:    Danny [name]                            DOB: September 03, 1961  Pickup: yes    Call - Phone # [phone]    INSURANCE:(Copy of card required if coverage has changed.) Home MAILING ADDRESS FOR VIALS   Primary:   Payor: [place] / Plan: [place] STATE BC/BS PPO / Product Type: PPO /   Danny [name]   [address]  [place] [address]     Where do you receive your allergy injections? HOME    Any systemic or major local reactions with previous vial?  No    Are you currently taking oral allergy medications or nose sprays in addition to your allergy injections?  Yes  Noticed any improvement with previous vial?  Stable compared to previous vials  Are you currently on a beta-blocker? No   List of Danny [name]'s medications:    Outpatient Medications Prior to Visit:  aspirin 81 mg Oral Tablet, Chewable Take 81 mg by mouth Once a day   atorvastatin (LIPITOR) 40 mg Oral Tablet Take 40 mg by mouth Every night   cholecalciferol, vitamin D3, 1,000 unit Oral Tablet Take 1,000 Units by mouth Once a day   Cinnamon Bark 500 mg Oral Capsule Take 2,000 mg by mouth   coenzyme Q10 30 mg Oral Capsule Take 200 mg by mouth Every evening with dinner    Colesevelam (WELCHOL) 3.75 gram Oral Powder in Packet Take 3.75 g by mouth Once a day   diphenhydrAMINE (BENADRYL) 25 mg Oral Capsule Take 25 mg by mouth Every 6 hours as needed   EPINEPHrine (EPIPEN) 0.3 mg/0.3 mL Injection Auto-Injector 0.3 mL (0.3 mg total) by Intramuscular route Once, as needed for up to 1 dose   FERROUS FUMARATE/VIT BCOMP,C (SUPER B COMPLEX ORAL) Take by mouth   Fluoxetine (PROZAC) 40 mg Oral Capsule Take 40 mg by mouth Once a day HVL   fluticasone (FLONASE) 50 mcg/actuation Nasal  Spray, Suspension 1 Spray by Each Nostril route Twice daily   LACTOBACILLUS ACIDOPHILUS (PROBIOTIC ORAL) Take by mouth   lansoprazole (PREVACID SOLUTAB) 15 mg Oral Tablet,Rapid Dissolve, DR Take 15 mg by mouth Once a day   loratadine (CLARITIN) 10 mg Oral Tablet Take 10 mg by mouth Once a day   multivitamin Oral Tablet Take 1 Tab by mouth Once a day   Olmesartan (BENICAR) 40 mg Oral Tablet Take 40 mg by mouth Once a day   Olopatadine (PATADAY) 0.2 % Ophthalmic Drops Instill 1 Drop into both eyes Once a day   PROAIR HFA 90 mcg/actuation Inhalation HFA Aerosol Inhaler Take 1-2 Puffs by inhalation Every 6 hours as needed   Syringe with Needle, Disp, (BD ALLERGY SYRINGE) 1 mL 28 gauge x 1/2" Syringe Use 2 syringes weekly for allergy injections   turmeric root extract 500 mg Oral Capsule Take 500 mg by mouth     No facility-administered  medications prior to visit.   See above list of meds for adjunct medication currently taking in conjunction with allergy injections.  SERUM requested:  Vial # FREQUENCY DATE LAST Injection DOSAGE   4   Every 7 days  0.50                       Allergy Serum Mixed on 02/04/2017 by Danny [name]:   Payor: [place] / Plan: [place] STATE BC/BS PPO / Product Type: PPO /     MAIV 5 UNITS #4 start @ 0.40, 0.50    Allergy Serum Mixed in NSS  Mixing Board Batch #: [id number]    Vials and dosage instructions will be mailed to Danny [name] at:  [address]  [place] [address] or the address of the patients preferred medical facility.    Or Danny [name] will be notified to pick up (([phone])      02/04/2017-Danny [name]-06-13-1961-Payor: BLUE CROSS BLUE SHIELD / Plan: [place] STATE BC/BS PPO / Product Type: PPO /

## 2017-02-18 ENCOUNTER — Encounter (INDEPENDENT_AMBULATORY_CARE_PROVIDER_SITE_OTHER): Payer: Self-pay | Admitting: Family Medicine

## 2017-02-18 DIAGNOSIS — E785 Hyperlipidemia, unspecified: Secondary | ICD-10-CM | POA: Insufficient documentation

## 2017-03-08 ENCOUNTER — Ambulatory Visit (INDEPENDENT_AMBULATORY_CARE_PROVIDER_SITE_OTHER): Payer: Self-pay | Admitting: Family Medicine

## 2017-03-08 NOTE — Telephone Encounter (Signed)
Pt has been sick and his wife would like for you to call her and talk to her Danny Brigham, LPN  4/81/8590, 93:11

## 2017-04-19 ENCOUNTER — Encounter (INDEPENDENT_AMBULATORY_CARE_PROVIDER_SITE_OTHER): Payer: Self-pay | Admitting: Family Medicine

## 2017-05-05 ENCOUNTER — Encounter (INDEPENDENT_AMBULATORY_CARE_PROVIDER_SITE_OTHER): Payer: Self-pay | Admitting: Family Medicine

## 2017-05-12 ENCOUNTER — Encounter (INDEPENDENT_AMBULATORY_CARE_PROVIDER_SITE_OTHER): Payer: Self-pay | Admitting: Family Medicine

## 2017-05-12 ENCOUNTER — Ambulatory Visit (INDEPENDENT_AMBULATORY_CARE_PROVIDER_SITE_OTHER): Payer: BC Managed Care – PPO | Admitting: Family Medicine

## 2017-05-12 VITALS — BP 124/80 | HR 76 | Resp 18 | Ht 72.0 in | Wt 233.0 lb

## 2017-05-12 DIAGNOSIS — Z6831 Body mass index (BMI) 31.0-31.9, adult: Secondary | ICD-10-CM

## 2017-05-12 DIAGNOSIS — G4730 Sleep apnea, unspecified: Secondary | ICD-10-CM

## 2017-05-12 DIAGNOSIS — R5382 Chronic fatigue, unspecified: Secondary | ICD-10-CM

## 2017-05-12 DIAGNOSIS — E782 Mixed hyperlipidemia: Secondary | ICD-10-CM

## 2017-05-12 DIAGNOSIS — R413 Other amnesia: Secondary | ICD-10-CM

## 2017-05-12 DIAGNOSIS — K219 Gastro-esophageal reflux disease without esophagitis: Secondary | ICD-10-CM

## 2017-05-12 NOTE — Progress Notes (Signed)
[facility]  [address] [place] [address]  Dept Phone: [phone]  Dept Fax: [phone]    Danny [name]  Oct 05, 1961  [id number]    Date of Service: 05/12/2017   Chief complaint:   Chief Complaint   Patient presents with   . Vomiting     needs scope   . Sleep Apnea     needs a test   . Medication No Longer Working     Prozac       Subjective:   Patient presents today for what is listed as an acute visit however I think it is more of a routine follow-up.  Patient has missed a couple of appointments due to scheduling issues and came in today and was accompanied by his adult son.  His to problems 3 wants to address primarily which we have discussed in the past.  First is his reflux symptoms.  Patient has had an upper endoscopy but it has been several years now.  He stem out sounds like he had an upper GI why he was down at his home and [place] but I do not have those records.  Patient states that he has reflux symptoms and cannot related to activity food types alcohol sleep stressor any other precipitating factor.  He does state that his proton pump inhibitor helps manage the symptoms but he would like to make sure that everything is okay and would like a workup.  He most likely would benefit from an upper endoscopy.    The patient continues to have issues with memory.  Sent him to [facility] for neuropsych testing and we got back a very detailed report.  They did not feel at that time that he had issues with dementia but felt that he has alcohol intake and he might benefit from therapy.  I would really like to have him seen and evaluated and treated by a psychiatrist to see if we can maximize his mental functioning.  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    The patient also may be having some problems with sleep apnea.  This could obviously  contribute to some of the other issues that we have discussed some going to send him for a sleep study and evaluation for possible sleep apnea.    Current Outpatient Prescriptions   Medication Sig   . aspirin 81 mg Oral Tablet, Chewable Take 81 mg by mouth Once a day   . atorvastatin (LIPITOR) 40 mg Oral Tablet Take 40 mg by mouth Every night   . cholecalciferol, vitamin D3, 1,000 unit Oral Tablet Take 1,000 Units by mouth Once a day   . Cinnamon Bark 500 mg Oral Capsule Take 2,000 mg by mouth   . coenzyme Q10 30 mg Oral Capsule Take 200 mg by mouth Every evening with dinner    . Colesevelam [facility]) 3.75 gram Oral Powder in Packet Take 3.75 g by mouth Once a day   . cyanocobalamin (VITAMIN B 12) 1,000 mcg Oral Tablet Take 1,000 mcg by mouth Once a day   . diphenhydrAMINE (BENADRYL) 25 mg Oral Capsule Take 25 mg by mouth Every 6 hours as needed   . EPINEPHrine (EPIPEN) 0.3 mg/0.3 mL Injection Auto-Injector 0.3 mL (0.3 mg total) by Intramuscular route Once, as needed for up to 1 dose   . FERROUS FUMARATE/VIT  BCOMP,C (SUPER B COMPLEX ORAL) Take by mouth   . Fluoxetine (PROZAC) 40 mg Oral Capsule Take 40 mg by mouth Once a day HVL   . fluticasone (FLONASE) 50 mcg/actuation Nasal Spray, Suspension 1 Spray by Each Nostril route Twice daily   . LACTOBACILLUS ACIDOPHILUS (PROBIOTIC ORAL) Take by mouth   . lansoprazole (PREVACID SOLUTAB) 15 mg Oral Tablet,Rapid Dissolve, DR Take 15 mg by mouth Once a day   . loratadine (CLARITIN) 10 mg Oral Tablet Take 10 mg by mouth Once a day   . metoprolol succinate (TOPROL-XL) 25 mg Oral Tablet Sustained Release 24 hr Take 25 mg by mouth Once a day   . multivitamin Oral Tablet Take 1 Tab by mouth Once a day   . Olmesartan (BENICAR) 40 mg Oral Tablet Take 40 mg by mouth Once a day   . Olopatadine (PATADAY) 0.2 % Ophthalmic Drops Instill 1 Drop into both eyes Once a day   . PROAIR HFA 90 mcg/actuation Inhalation HFA Aerosol Inhaler Take 1-2 Puffs by inhalation Every 6 hours as needed   .  Syringe with Needle, Disp, (BD ALLERGY SYRINGE) 1 mL 28 gauge x 1/2" Syringe Use 2 syringes weekly for allergy injections   . turmeric root extract 500 mg Oral Capsule Take 500 mg by mouth       Objective:   BP 124/80  Pulse 76  Resp 18  Ht 1.829 m (6')  Wt 105.7 kg (233 lb)  SpO2 95%  BMI 31.6 kg/m2  General appearance: alert, oriented x 3, in his normal state, cooperative, not in apparent distress, appearing stated age   HEENT:  Eyes:  Pupils equal round react like accommodation extraocular muscles intact. Ears within normal limits throat clear, tonsils normal, no cervical lymphadenopathy thyroid normal  Lungs: clear to auscultation bilaterally, respirations non-labored  Heart: regular rate and rhythm, S1, S2 normal, no murmur, PMI non-diffuse  Abdomen: soft, non-tender. Bowel sounds normal.  Extremities: extremities normal, atraumatic, no cyanosis or edema, pulses intact in upper and lower extremities    Assessment     ENCOUNTER DIAGNOSES     ICD-10-CM   1. Chronic fatigue R53.82   2. BMI 31.0-31.9,adult Z68.31   3. Mixed hyperlipidemia E78.2   4. Sleep apnea, unspecified type G47.30   5. Gastroesophageal reflux disease, esophagitis presence not specified K21.9   6. Memory loss R41.3       Plan     Orders Placed This Encounter   . CBC   . COMPREHENSIVE METABOLIC PNL, FASTING   . LIPID PANEL   . THYROID STIMULATING HORMONE (SENSITIVE TSH)   . Refer to [facility] Gastroenterology   . Refer to [facility] Psychiatry   . Refer to Rodney Langton MD     BMI addressed: Advised on diet, weight loss, and exercise to reduce above normal BMI.          Patient is to continue on all current medications other than those changed in the orders.     1.  GERD:  Going to refer to Gastroenterology for possible endoscopy and workup and treatment for his symptoms.  They are moderately well controlled but I feel that we need a better idea of what is going on and how best to manage this.  2.  Memory issues:  We will refer to Psychiatry.  Could be  partly depression related may be he would benefit from changing his medications.  His neuropsych testing suggested reduced alcohol intake which I discussed with him and  the patient is in agreement with this.  Also they suggested possible counseling and the  patient is agreeable to this also.  I am going to refer to Psychiatry [place] to have them help manage his treatment  3.  Possible sleep apnea we will refer to Neurology for evaluation and treatment if necessary.    Nelda Severe Mel Almond, M.D.

## 2017-05-20 ENCOUNTER — Ambulatory Visit (INDEPENDENT_AMBULATORY_CARE_PROVIDER_SITE_OTHER): Payer: BC Managed Care – PPO

## 2017-05-20 ENCOUNTER — Other Ambulatory Visit (INDEPENDENT_AMBULATORY_CARE_PROVIDER_SITE_OTHER): Payer: Self-pay | Admitting: Family Medicine

## 2017-05-20 ENCOUNTER — Other Ambulatory Visit: Payer: BC Managed Care – PPO | Attending: Family Medicine | Admitting: Family Medicine

## 2017-05-20 DIAGNOSIS — E782 Mixed hyperlipidemia: Secondary | ICD-10-CM

## 2017-05-20 DIAGNOSIS — K219 Gastro-esophageal reflux disease without esophagitis: Secondary | ICD-10-CM | POA: Insufficient documentation

## 2017-05-20 DIAGNOSIS — R5382 Chronic fatigue, unspecified: Secondary | ICD-10-CM

## 2017-05-20 LAB — CBC
HCT: 46.7 % (ref 38.9–50.5)
HGB: 16.1 g/dL (ref 13.4–17.3)
MCH: 33.6 pg — ABNORMAL HIGH (ref 27.9–33.1)
MCHC: 34.5 g/dL (ref 32.8–36.0)
MCHC: 34.5 g/dL (ref 32.8–36.0)
MCV: 97.5 fL — ABNORMAL HIGH (ref 82.4–95.0)
MPV: 7.8 fL (ref 6.0–10.2)
PLATELETS: 223 x10ˆ3/uL (ref 140–440)
RBC: 4.79 x10ˆ6/uL (ref 4.40–5.68)
RDW: 13.9 % (ref 10.9–15.1)
WBC: 4.6 10*3/uL (ref 3.3–9.3)
WBC: 4.6 x10ˆ3/uL (ref 3.3–9.3)

## 2017-05-20 LAB — COMPREHENSIVE METABOLIC PNL, FASTING
ALBUMIN: 4.5 g/dL (ref 3.2–4.6)
ALKALINE PHOSPHATASE: 71 U/L (ref 20–130)
ALT (SGPT): 74 U/L — ABNORMAL HIGH (ref <=52)
ANION GAP: 10 mmol/L
AST (SGOT): 85 U/L — ABNORMAL HIGH (ref <=35)
BILIRUBIN TOTAL: 0.7 mg/dL (ref 0.3–1.2)
BUN/CREA RATIO: 7
BUN: 7 mg/dL — ABNORMAL LOW (ref 10–25)
CALCIUM: 9.4 mg/dL (ref 8.2–10.2)
CHLORIDE: 97 mmol/L — ABNORMAL LOW (ref 98–111)
CO2 TOTAL: 26 mmol/L (ref 21–35)
CREATININE: 0.94 mg/dL (ref <=1.30)
ESTIMATED GFR: >60 mL/min/1.73mˆ2
GLUCOSE: 108 mg/dL (ref 70–110)
POTASSIUM: 4.4 mmol/L (ref 3.5–5.0)
PROTEIN TOTAL: 7.5 g/dL (ref 6.0–8.3)
SODIUM: 133 mmol/L — ABNORMAL LOW (ref 135–145)

## 2017-05-20 LAB — THYROID STIMULATING HORMONE (SENSITIVE TSH): TSH: 2.42 u[IU]/mL (ref 0.450–5.330)

## 2017-05-20 NOTE — Telephone Encounter (Signed)
Pt has not been taking his Lipitor for 2 months . Lincoln Brigham, LPN  1/66/0630, 16:01

## 2017-05-21 MED ORDER — OLMESARTAN 40 MG TABLET
40.0000 mg | ORAL_TABLET | Freq: Every day | ORAL | 3 refills | Status: DC
Start: 2017-05-21 — End: 2019-07-17

## 2017-05-21 MED ORDER — ATORVASTATIN 40 MG TABLET
40.0000 mg | ORAL_TABLET | Freq: Every evening | ORAL | 3 refills | Status: DC
Start: 2017-05-21 — End: 2017-11-10

## 2017-05-25 ENCOUNTER — Encounter: Payer: Self-pay | Admitting: Specialist

## 2017-05-25 ENCOUNTER — Ambulatory Visit: Payer: BC Managed Care – PPO | Admitting: Specialist

## 2017-05-25 ENCOUNTER — Ambulatory Visit: Payer: BC Managed Care – PPO

## 2017-05-25 VITALS — BP 120/74 | Ht 72.0 in | Wt 220.0 lb

## 2017-05-25 DIAGNOSIS — Z8639 Personal history of other endocrine, nutritional and metabolic disease: Secondary | ICD-10-CM

## 2017-05-25 DIAGNOSIS — G2581 Restless legs syndrome: Secondary | ICD-10-CM

## 2017-05-25 DIAGNOSIS — R5383 Other fatigue: Secondary | ICD-10-CM | POA: Insufficient documentation

## 2017-05-25 DIAGNOSIS — E669 Obesity, unspecified: Secondary | ICD-10-CM

## 2017-05-25 DIAGNOSIS — G4730 Sleep apnea, unspecified: Secondary | ICD-10-CM

## 2017-05-25 DIAGNOSIS — Z8679 Personal history of other diseases of the circulatory system: Secondary | ICD-10-CM

## 2017-05-25 LAB — FERRITIN: FERRITIN: 191 ng/mL (ref 24–336)

## 2017-05-25 NOTE — Progress Notes (Signed)
[place]  [place] [address]     Consultation    Name: Danny [name] MRN:  [id number]   Date: 05/25/2017 Age: 56 y.o.     Referring Provider:  Wonda Horner.  Primary Care Provider: Wonda Horner., MD       Chief Complaint: Sleep Apnea    History of Present Illness   Mr. Danny [name] is a 56 y.o. year old male who comes to clinic with history of fatigue.  He has an number of medical problems including hypertension and hyperlipidemia.  He goes to bed at around 10 and gets up by 6. He snores very loudly he was threatening to show me the videotape of his snoring.  Which I did end up watching.  He feels his tired a through much of the day.  Occasionally his legs jerk.  He has 3 of 4 diet Pepsi each day. he denied cognitive or bulbar complaints.  He denied focal weakness or numbness.  Rest of his neurologic end query was negative.    Patient Active Problem List    Diagnosis   . Hyperlipidemia   . Chronic allergic rhinitis   . 2-D Echo   . Stress echo   . HTN (hypertension)   . GERD (gastroesophageal reflux disease)     Past Medical History:   Diagnosis Date   . Elevated lipids    . GERD (gastroesophageal reflux disease)    . Hypertension          Past Surgical History:   Procedure Laterality Date   . HX ELBOW SURGERY Left 2011   . HX HAND SURGERY Left 06/2013    incision and drainage   . HX HAND SURGERY Left     incision and drainage   . HX WISDOM TEETH EXTRACTION           Current Outpatient Prescriptions   Medication Sig   . aspirin 81 mg Oral Tablet, Chewable Take 81 mg by mouth Once a day   . atorvastatin (LIPITOR) 40 mg Oral Tablet Take 1 Tab (40 mg total) by mouth Every night   . cholecalciferol, vitamin D3, 1,000 unit Oral Tablet Take 1,000 Units by mouth Once a day   . Cinnamon Bark 500 mg Oral Capsule Take 2,000 mg by mouth   . coenzyme Q10 30 mg Oral Capsule Take 200 mg by mouth Every evening with dinner    . Colesevelam [facility] 3.75 gram Oral Powder in Packet  Take 3.75 g by mouth Once a day   . cyanocobalamin (VITAMIN B 12) 1,000 mcg Oral Tablet Take 1,000 mcg by mouth Once a day   . diphenhydrAMINE (BENADRYL) 25 mg Oral Capsule Take 25 mg by mouth Every 6 hours as needed   . EPINEPHrine (EPIPEN) 0.3 mg/0.3 mL Injection Auto-Injector 0.3 mL (0.3 mg total) by Intramuscular route Once, as needed for up to 1 dose   . FERROUS FUMARATE/VIT BCOMP,C (SUPER B COMPLEX ORAL) Take by mouth   . Fluoxetine (PROZAC) 40 mg Oral Capsule Take 40 mg by mouth Once a day HVL   . fluticasone (FLONASE) 50 mcg/actuation Nasal Spray, Suspension 1 Spray by Each Nostril route Twice daily   . LACTOBACILLUS ACIDOPHILUS (PROBIOTIC ORAL) Take by mouth   . lansoprazole (PREVACID SOLUTAB) 15 mg Oral Tablet,Rapid Dissolve, DR Take 15 mg by mouth Once a day   . loratadine (CLARITIN) 10 mg Oral Tablet Take 10 mg by mouth Once  a day   . metoprolol succinate (TOPROL-XL) 25 mg Oral Tablet Sustained Release 24 hr Take 25 mg by mouth Once a day   . multivitamin Oral Tablet Take 1 Tab by mouth Once a day   . olmesartan (BENICAR) 40 mg Oral Tablet Take 1 Tab (40 mg total) by mouth Once a day   . Olopatadine (PATADAY) 0.2 % Ophthalmic Drops Instill 1 Drop into both eyes Once a day   . PROAIR HFA 90 mcg/actuation Inhalation HFA Aerosol Inhaler Take 1-2 Puffs by inhalation Every 6 hours as needed   . Syringe with Needle, Disp, (BD ALLERGY SYRINGE) 1 mL 28 gauge x 1/2" Syringe Use 2 syringes weekly for allergy injections   . turmeric root extract 500 mg Oral Capsule Take 500 mg by mouth     Allergies   Allergen Reactions   . Percocet [Oxycodone] Anaphylaxis     Family Medical History     Problem Relation (Age of Onset)    Alzheimer's/Dementia Father    Cancer Other, Mother, Sister    HTN <20 y.o. Other    Healthy Sister, Son, Son    Hypertension Father            Social History   Substance Use Topics   . Smoking status: Never Smoker   . Smokeless tobacco: Never Used   . Alcohol use 3.0 - 3.6 oz/week     5 - 6 Cans  of beer per week      Review of Systems  Constitutional:  He has been trying to lose weight.  Eyes:  Wears glasses  Ears, nose, mouth, throat:  History of snoring  Respiratory: negative  Cardiovascular: negative  Gastrointestinal:  History of reflux disease  Genitourinary:negative  Integument/breast: negative  Hematologic/lymphatic: negative  Musculoskeletal:negative  Neurological: as in consult/notes  Behavioral/Psych:  Has anxiety  Endocrine: negative  Allergic/Immunologic: negative          Examination:  BP 120/74  Ht 1.829 m (6')  Wt 99.8 kg (220 lb)  BMI 29.84 kg/m2  He was of fairly large build.  Airway class was 4.  Neck circumference was 20-1/2 inches.  He had a fairly large tongue.  Nostrils were patent but narrow.  There was no thyromegaly.  Lungs were clear.  Abdomen was soft.  Eyes:  Disc margins were sharp. There was no papilledema.    Cardiovascular:  No cardiac murmur.  No carotid bruit.     Neurology:    Mental status:  Alert and oriented.  Attention, concentration, language functions, recent and remote memory were all within normal limits.    Cranial nerves:  Extraocular movements and visual fields were full. Facial sensation and strength were normal. Hearing was intact. Tongue and palate were in the midline. Strength of sternocleidomastoids was normal. All cranial nerve functions were normal.    Motor:  Normal strength and tone were normal in upper and lower extremities.    Sensation:  All modalities were intact.     Reflexes:  Hypoactive Plantar responses were flexor.    Cerebrallar functions:  Normal.  Finger to nose testing was normal.    Gait and station:  Normal. Fairly good arm swing while ambulating.  Could tandem walk.    Musculoskeletal:  Neck was supple.  Sitting straight-leg raising test was negative.            Data reviewed:    2018 WBC 5.6, hemoglobin 16.1, Chem profile normal, total cholesterol 320, LDL 242, I triglyceride  152, TSH 2.42    Assessment and Recommendations  Problem  List Items Addressed This Visit     None      Visit Diagnoses     Fatigue    -  Primary    Relevant Orders    POLYSOMNOGRAPHY - ANP OVERNIGHT - 1ST NIGHT OF STUDY    FERRITIN (Completed)    Sleep apnea, unspecified type        Relevant Orders    POLYSOMNOGRAPHY - ANP OVERNIGHT - 1ST NIGHT OF STUDY    Obesity        History of hypertension        Restless leg syndrome        Relevant Orders    FERRITIN (Completed)    History of hyperlipidemia            Plan:    1.Sleep precautions, including but not restricted to those listed below were discussed with the patient.   A. Patient was advised to avoid potentially dangerous activities and activities requiring full alertness while, if drowsy.  B. Patient is not to drive or operate heavy machinery, if drowsy.   C. Patient was advised that sedating medications and alcohol may worsen the symptoms of drowsiness and inattentiveness.   D. Patient was advised about diet and other measures related to weight management.   E. Consequence of untreated sleep apnea including cardiac failure, hypertension etc. discussed.  F. Sleep study being obtained.    2.  He has mild restlessness of his legs. This requires no drug therapy.  Serum ferritin level will be obtained    4. He has multiple risk factors for cardiovascular disease.  Usual stroke prevention measures were discussed with him    5. His lipid profile is prominently abnormal.  He however went on to tell me that the before obtaining lipid studies he had been off statin medications for more than 3 weeks.    6. At least 50% of the time was spent counseling patient.    Bryton Romagnoli Wendee Beavers, MD

## 2017-05-26 ENCOUNTER — Other Ambulatory Visit (INDEPENDENT_AMBULATORY_CARE_PROVIDER_SITE_OTHER): Payer: Self-pay | Admitting: Family Medicine

## 2017-05-26 ENCOUNTER — Telehealth (INDEPENDENT_AMBULATORY_CARE_PROVIDER_SITE_OTHER): Payer: Self-pay | Admitting: Family Medicine

## 2017-05-26 DIAGNOSIS — F329 Major depressive disorder, single episode, unspecified: Principal | ICD-10-CM

## 2017-05-26 DIAGNOSIS — F32A Depression, unspecified: Secondary | ICD-10-CM

## 2017-05-26 NOTE — Telephone Encounter (Signed)
The patients son called in stating that they needed to get him seen in psychiatry ASAP. The family is in agreement that his Prozac is no longer working, and his depression has gotten way worse. The family is willing to pay out of pocket to get him in to see someone sooner. They would like to talk to someone who can change his medication. Danny [name], [place]  05/26/2017, 13:54

## 2017-05-26 NOTE — Telephone Encounter (Signed)
Appt made , see referral Lincoln Brigham, LPN  04/23/6269, 35:00

## 2017-06-01 ENCOUNTER — Other Ambulatory Visit (INDEPENDENT_AMBULATORY_CARE_PROVIDER_SITE_OTHER): Payer: BC Managed Care – PPO

## 2017-06-01 DIAGNOSIS — J3089 Other allergic rhinitis: Secondary | ICD-10-CM

## 2017-06-02 ENCOUNTER — Other Ambulatory Visit (INDEPENDENT_AMBULATORY_CARE_PROVIDER_SITE_OTHER): Payer: BC Managed Care – PPO

## 2017-06-02 DIAGNOSIS — J3089 Other allergic rhinitis: Principal | ICD-10-CM

## 2017-06-02 NOTE — Nursing Note (Signed)
[facility] ENT & Audiology              Phone: [phone]   [address] [place], [place] [address]          Fax:     [phone]  Allergy Department Phone [phone]      ALLERGY SERUM  - ORDER FORM       Date Order was Received :   05/21/2017     Patient Name:    Danny [name]                            DOB: 1961-10-19  Pickup: yes    Call - Phone # [phone]    INSURANCE:(Copy of card required if coverage has changed.) Home MAILING ADDRESS FOR VIALS   Primary:   Payor: [place] / Plan: [place] STATE BC/BS PPO / Product Type: PPO /   Danny [name]   [address]  [place] [address]     Where do you receive your allergy injections? HOME    Any systemic or major local reactions with previous vial?  No    Are you currently taking oral allergy medications or nose sprays in addition to your allergy injections?  Yes  Noticed any improvement with previous vial?  Stable compared to previous vials  Are you currently on a beta-blocker? No   List of Danny [name]'s medications:    Outpatient Medications Prior to Visit:  aspirin 81 mg Oral Tablet, Chewable Take 81 mg by mouth Once a day   atorvastatin (LIPITOR) 40 mg Oral Tablet Take 1 Tab (40 mg total) by mouth Every night   cholecalciferol, vitamin D3, 1,000 unit Oral Tablet Take 1,000 Units by mouth Once a day   Cinnamon Bark 500 mg Oral Capsule Take 2,000 mg by mouth   coenzyme Q10 30 mg Oral Capsule Take 200 mg by mouth Every evening with dinner    Colesevelam (WELCHOL) 3.75 gram Oral Powder in Packet Take 3.75 g by mouth Once a day   cyanocobalamin (VITAMIN B 12) 1,000 mcg Oral Tablet Take 1,000 mcg by mouth Once a day   diphenhydrAMINE (BENADRYL) 25 mg Oral Capsule Take 25 mg by mouth Every 6 hours as needed   EPINEPHrine (EPIPEN) 0.3 mg/0.3 mL Injection Auto-Injector 0.3 mL (0.3 mg total) by Intramuscular route Once, as needed for up to 1 dose   FERROUS FUMARATE/VIT BCOMP,C (SUPER B COMPLEX ORAL) Take by mouth   Fluoxetine (PROZAC) 40  mg Oral Capsule Take 40 mg by mouth Once a day HVL   fluticasone (FLONASE) 50 mcg/actuation Nasal Spray, Suspension 1 Spray by Each Nostril route Twice daily   LACTOBACILLUS ACIDOPHILUS (PROBIOTIC ORAL) Take by mouth   lansoprazole (PREVACID SOLUTAB) 15 mg Oral Tablet,Rapid Dissolve, DR Take 15 mg by mouth Once a day   loratadine (CLARITIN) 10 mg Oral Tablet Take 10 mg by mouth Once a day   metoprolol succinate (TOPROL-XL) 25 mg Oral Tablet Sustained Release 24 hr Take 25 mg by mouth Once a day   multivitamin Oral Tablet Take 1 Tab by mouth Once a day   olmesartan (BENICAR) 40 mg Oral Tablet Take 1 Tab (40 mg total) by mouth Once a day   Olopatadine (PATADAY) 0.2 % Ophthalmic Drops Instill 1 Drop into both eyes Once a day   PROAIR HFA 90 mcg/actuation Inhalation HFA Aerosol Inhaler Take 1-2 Puffs by inhalation Every 6 hours  as needed   Syringe with Needle, Disp, (BD ALLERGY SYRINGE) 1 mL 28 gauge x 1/2" Syringe Use 2 syringes weekly for allergy injections   turmeric root extract 500 mg Oral Capsule Take 500 mg by mouth     No facility-administered medications prior to visit.   See above list of meds for adjunct medication currently taking in conjunction with allergy injections.  SERUM requested:  Vial # FREQUENCY DATE LAST Injection DOSAGE   1   Every 7 days 6/5 0.5   2     0.5   3     0.1       Allergy Serum Mixed on 06/02/2017 by Danny [name]:   Payor: [place] / Plan: [place] STATE BC/BS PPO / Product Type: PPO /     MAIV 5 UNITS #1 start @ 0.40, 0.50  MAIV 5 UNITS #2 start @ 0.40, 0.50  SAIV 1 UNIT #3 start @ 0.10,     Allergy Serum Mixed in NSS  Mixing Board Batch #: [id number]    Vials and dosage instructions will be mailed to Danny [name] or the address of the patients preferred medical facility.    Or Danny [name] will be notified to pick up [phone])      06/02/2017-Danny [name]-03-09-1961-Payor: BLUE CROSS BLUE SHIELD / Plan: [place] BC/BS PPO / Product  Type: PPO /

## 2017-06-02 NOTE — Nursing Note (Signed)
[facility] ENT & Audiology              Phone: [phone]   [address] [place], [place] [address]          Fax:     [phone]  Allergy Department Phone [phone]      ALLERGY SERUM  - ORDER FORM       Date Order was Received :   05/21/2017     Patient Name:    Danny [name]                            DOB: [birth year]  Pickup: yes    Call - Phone # [phone]    INSURANCE:(Copy of card required if coverage has changed.) Home MAILING ADDRESS FOR VIALS   Primary:   [name]: [place] / Plan: [place] STATE BC/BS PPO / Product Type: PPO /   Danny [name]   [address]  [place] [address]     Where do you receive your allergy injections? HOME    Any systemic or major local reactions with previous vial?  No    Are you currently taking oral allergy medications or nose sprays in addition to your allergy injections?  Yes  Noticed any improvement with previous vial?  Stable compared to previous vials  Are you currently on a beta-blocker? No   List of Danny [name]'s medications:    Outpatient Medications Prior to Visit:  aspirin 81 mg Oral Tablet, Chewable Take 81 mg by mouth Once a day   atorvastatin (LIPITOR) 40 mg Oral Tablet Take 1 Tab (40 mg total) by mouth Every night   cholecalciferol, vitamin D3, 1,000 unit Oral Tablet Take 1,000 Units by mouth Once a day   Cinnamon Bark 500 mg Oral Capsule Take 2,000 mg by mouth   coenzyme Q10 30 mg Oral Capsule Take 200 mg by mouth Every evening with dinner    Colesevelam (WELCHOL) 3.75 gram Oral Powder in Packet Take 3.75 g by mouth Once a day   cyanocobalamin (VITAMIN B 12) 1,000 mcg Oral Tablet Take 1,000 mcg by mouth Once a day   diphenhydrAMINE (BENADRYL) 25 mg Oral Capsule Take 25 mg by mouth Every 6 hours as needed   EPINEPHrine (EPIPEN) 0.3 mg/0.3 mL Injection Auto-Injector 0.3 mL (0.3 mg total) by Intramuscular route Once, as needed for up to 1 dose   FERROUS FUMARATE/VIT BCOMP,C (SUPER B COMPLEX ORAL) Take by mouth   Fluoxetine (PROZAC) 40  mg Oral Capsule Take 40 mg by mouth Once a day HVL   fluticasone (FLONASE) 50 mcg/actuation Nasal Spray, Suspension 1 Spray by Each Nostril route Twice daily   LACTOBACILLUS ACIDOPHILUS (PROBIOTIC ORAL) Take by mouth   lansoprazole (PREVACID SOLUTAB) 15 mg Oral Tablet,Rapid Dissolve, DR Take 15 mg by mouth Once a day   loratadine (CLARITIN) 10 mg Oral Tablet Take 10 mg by mouth Once a day   metoprolol succinate (TOPROL-XL) 25 mg Oral Tablet Sustained Release 24 hr Take 25 mg by mouth Once a day   multivitamin Oral Tablet Take 1 Tab by mouth Once a day   olmesartan (BENICAR) 40 mg Oral Tablet Take 1 Tab (40 mg total) by mouth Once a day   Olopatadine (PATADAY) 0.2 % Ophthalmic Drops Instill 1 Drop into both eyes Once a day   PROAIR HFA 90 mcg/actuation Inhalation HFA Aerosol Inhaler Take 1-2 Puffs by inhalation Every 6 hours  as needed   Syringe with Needle, Disp, (BD ALLERGY SYRINGE) 1 mL 28 gauge x 1/2" Syringe Use 2 syringes weekly for allergy injections   turmeric root extract 500 mg Oral Capsule Take 500 mg by mouth     No facility-administered medications prior to visit.   See above list of meds for adjunct medication currently taking in conjunction with allergy injections.  SERUM requested:  Vial # FREQUENCY DATE LAST Injection DOSAGE   4   Every 7 days 6/5 0.50                       Allergy Serum Mixed on 06/01/2017 by Danny [name]:   [name]: [place] / Plan: [place] STATE BC/BS PPO / Product Type: PPO /     MAIV 5 UNITS #4 start @ 0.40, 0.50      Allergy Serum Mixed in NSS  Mixing Board Batch #: [id number]    Vials and dosage instructions will be mailed to [place] at:  [address]  [place] [address] or the address of the patients preferred medical facility.    Or [name] will be notified to pick up [phone]      06/01/2017-Danny [name]-[birth year]-[name]: [employer] BLUE [employer] / Plan: [place] BC/BS PPO / Product Type: PPO /

## 2017-06-04 LAB — LIPID PANEL
CHOLESTEROL: 320 mg/dL — ABNORMAL HIGH (ref 0–199)
HDL CHOL: 54 mg/dL (ref >40)
LDL DIRECT: 242 mg/dL — ABNORMAL HIGH (ref 0–99)
TRIGLYCERIDES: 150 mg/dL (ref 0–199)
VLDL CALC: 30 mg/dL (ref 0–50)
VLDL CALC: 30 mg/dL (ref 0–50)

## 2017-06-21 ENCOUNTER — Ambulatory Visit
Admission: RE | Admit: 2017-06-21 | Discharge: 2017-06-21 | Disposition: A | Discharge status: Home / Self Care | Payer: BC Managed Care – PPO | Source: Ambulatory Visit

## 2017-06-21 DIAGNOSIS — R5383 Other fatigue: Secondary | ICD-10-CM

## 2017-06-21 DIAGNOSIS — G4730 Sleep apnea, unspecified: Secondary | ICD-10-CM

## 2017-06-28 ENCOUNTER — Encounter: Payer: Self-pay | Admitting: Specialist

## 2017-07-05 ENCOUNTER — Encounter (HOSPITAL_BASED_OUTPATIENT_CLINIC_OR_DEPARTMENT_OTHER): Payer: Self-pay | Admitting: Internal Medicine

## 2017-07-05 ENCOUNTER — Ambulatory Visit: Payer: BC Managed Care – PPO | Admitting: Specialist

## 2017-07-05 ENCOUNTER — Ambulatory Visit: Payer: BC Managed Care – PPO | Admitting: Internal Medicine

## 2017-07-05 VITALS — BP 125/84 | HR 92 | Temp 98.6°F | Resp 16 | Ht 72.0 in | Wt 233.9 lb

## 2017-07-05 DIAGNOSIS — K219 Gastro-esophageal reflux disease without esophagitis: Secondary | ICD-10-CM

## 2017-07-05 DIAGNOSIS — R0683 Snoring: Secondary | ICD-10-CM

## 2017-07-05 DIAGNOSIS — R5383 Other fatigue: Secondary | ICD-10-CM

## 2017-07-05 DIAGNOSIS — G4730 Sleep apnea, unspecified: Secondary | ICD-10-CM

## 2017-07-05 DIAGNOSIS — Z1211 Encounter for screening for malignant neoplasm of colon: Secondary | ICD-10-CM

## 2017-07-05 DIAGNOSIS — Z6831 Body mass index (BMI) 31.0-31.9, adult: Secondary | ICD-10-CM

## 2017-07-05 DIAGNOSIS — R748 Abnormal levels of other serum enzymes: Secondary | ICD-10-CM

## 2017-07-05 NOTE — Patient Instructions (Signed)
Upper GI Endoscopy     During endoscopy, a long, flexible tube is used to view the inside of your upper GI tract.      Upper GI endoscopy allows your healthcare provider to look directly into the beginning of your gastrointestinal (GI) tract. The esophagus, stomach, and duodenum (the first part of the small intestine) make up the upper GI tract.  Before the exam  Follow these and any other instructions you are given before your endoscopy. If you don't follow the healthcare provider's instructions carefully, the test may need to be canceled or done over:   Don't eat or drink anything after midnight the night before your exam. If your exam is in the afternoon, drink only clear liquids in the morning. Don't eat or drink anything for8 hours before the exam. In some cases, you may be able to take medicines with sips of water until 2 hours before the procedure. Speak with your healthcare provider about this.   Bring your X-rays and any other test results you have.   Because you will be sedated, arrange for an adult to drive you home after the exam.   Tell your healthcare provider before the exam if you are taking any medicines or have any medical problems.  The procedure  Here is what to expect:   You will lie on the endoscopy table. Usually patients lie on the left side.   You will be monitored and given oxygen.   Your throat may be numbed with a spray or gargle. You are given medicine through an intravenous (IV) line that will help you relax and remain comfortable. You may be awake or asleep during the procedure.   The healthcare provider will put the endoscope in your mouth and down your esophagus.Itis thinner than most pieces of food that you swallow. It will not affect your breathing. The medicine helps keep you from gagging.   Air is put into your GI tract to expand it. It can make you burp.   During the procedure, the healthcare provider can take biopsies (tissue samples), remove abnormalities,  such as polyps, or treat abnormalities through a variety of devices placed through the endoscope. You will not feel this.   The endoscope carries images of your upper GI tract to a video screen. If you are awake, you may be able to look at the images.   After the procedure is done, you will rest for a time. An adult must drive you home.  When to call your healthcare provider  Contact your healthcare provider if you have:   Black or tarry stools, or blood in your stool   Fever   Pain in your belly that does not go away   Nausea and vomiting, or vomiting blood   Date Last Reviewed: 06/21/2015   2000-2017 [employer] [address], [place] [address]. All rights reserved. This information is not intended as a substitute for professional medical care. Always follow your healthcare professional's instructions.

## 2017-07-05 NOTE — Progress Notes (Signed)
[facility]                                                      Department of Gastroenterology  [place], [place] [address]    Date:   07/05/2017  Name: Danny Danny [name]  Age: 56 y.o.    Referring Provider:    Wonda Danny [name]., MD  Danny  [address]  Danny [name], [place] [address]    Primary Care Provider:    Wonda Danny [name]., MD    Chief Complaint: New Patient; Esophageal Reflux; and Vomiting    History of Present Illness  Mr.Danny Danny [name] is a pleasent 56 y.o. male, who is a self-employed businessman, with past medical history as mentioned below is consulted for New Patient; Esophageal Reflux; and Vomiting. The history is obtained from the patient and family member.     Mr.Danny Danny [name] states that Danny [name] has chronic symptoms of acid reflux since his 41s.  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  Patient states that Danny [name] had history of tendinitis at that time and was prescribed cortisone and Excedrin for many years.  Patient states that at the age of 51 Danny [name] started to have symptoms of abdominal pain, acid reflux and hoarseness of his voice.  Patient states that Danny [name] was started on Prevacid at that time.  Patient acknowledges multiple endoscopies since then.  Patient states that his last endoscopy was in 2015.  Patient states that Danny [name] had both EGD and colonoscopy at that time.  Patient states that the endoscopy showed inflammation in the stomach otherwise unremarkable.  The colonoscopy prep was poor.  Recommend repeat exam.  Patient states that Danny [name] did not had repeat exam since.  Patient states that since 12/2016 Danny [name] started to have symptoms of vomiting. Patient states that Danny [name] has approximately 5 episodes of vomiting a week.  Patient states that his symptoms of vomiting occurs mainly 1 hr after breakfast.  Patient states that Danny [name] is taking Prevacid 20 mg once a day with suboptimal relief of symptoms.  Patient acknowledges significant alcohol use, approximately 5-6 beers a day.  Danny Danny [name]  denies any  difficulty with swallowing, abdominal pain, abdominal bloating, constipation, diarrhea, rectal bleeding, melena, hematemesis, unintentional weight loss or other complaints.    Mr.Danny Danny [name] acknowledges prior EGD and colonoscopy. Danny [name] denies cholecystectomy, intestinal surgeries.     On chart review, Mr.Danny Danny [name] was noted to have   Labs in 10/2016 which showed unremarkable CBC.  Normal BUN/creatinine.  Mildly elevated AST/ALT otherwise unremarkable liver enzymes.   Ultrasound of the abdomen on 10/23/2016 showed possible tiny gallbladder polyp otherwise unremarkable.   Labs in 04/2017 showed unremarkable CBC.  normal BUN/creatinine.  Mildly elevated AST/ALT otherwise unremarkable liver enzymes.  Normal TSH.    Review of Systems  Constitutional: Denies fevers, chills  Eyes: Denies changes in vision  Respiratory: Denies dyspnea on exertion  Cardiovascular: Denies chest pain  Gastrointestinal: unremarkable except as mentioned above  Genitourinary: Denies dysuria  Hematologic/lymphatic: Denies bleeding   Neurological: Denies seizures  Behavioral/Psych: Denies depression  Skin:  Denies any ulcers    Past Medical History  Past Medical History:   Diagnosis Date    Elevated lipids     GERD (gastroesophageal reflux disease)     Hypertension     Vomiting  Past Surgical History:   Procedure Laterality Date    COLONOSCOPY  2015    [facility]    ESOPHAGOGASTRODUODENOSCOPY  2015    [facility]    HX ELBOW SURGERY Left 2011    HX HAND SURGERY Left 06/2013    incision and drainage    HX HAND SURGERY Left     incision and drainage    HX WISDOM TEETH EXTRACTION           Social History  Social History     Social History    Marital status: Married     Spouse name: N/A    Number of children: N/A    Years of education: N/A     Occupational History    Not on file.     Social History Main Topics    Smoking status: Never Smoker    Smokeless tobacco: Never Used    Alcohol use 18.0 oz/week     30 Cans of beer per week     Drug use: No    Sexual activity: Yes     Partners: Female     Patent examiner protection: None     Other Topics Concern    Right Hand Dominant Yes     Social History Narrative     Allergies   Allergen Reactions    Percocet [Oxycodone] Anaphylaxis     Current Outpatient Prescriptions   Medication Sig    amLODIPine (NORVASC) 5 mg Oral Tablet Take 5 mg by mouth Once a day    ARIPiprazole (ABILIFY) 5 mg Oral Tablet Take 5 mg by mouth Once a day    aspirin 81 mg Oral Tablet, Chewable Take 81 mg by mouth Once a day    atorvastatin (LIPITOR) 40 mg Oral Tablet Take 1 Tab (40 mg total) by mouth Every night    cholecalciferol, vitamin D3, 1,000 unit Oral Tablet Take 1,000 Units by mouth Once a day    Colesevelam [facility]) 3.75 gram Oral Powder in Packet Take 3.75 g by mouth Once a day    cyanocobalamin (VITAMIN B 12) 1,000 mcg Oral Tablet Take 1,000 mcg by mouth Once a day    diphenhydrAMINE (BENADRYL) 25 mg Oral Capsule Take 25 mg by mouth Every 6 hours as needed    EPINEPHrine (EPIPEN) 0.3 mg/0.3 mL Injection Auto-Injector 0.3 mL (0.3 mg total) by Intramuscular route Once, as needed for up to 1 dose    Fluoxetine (PROZAC) 40 mg Oral Capsule Take 40 mg by mouth Once a day HVL    fluticasone (FLONASE) 50 mcg/actuation Nasal Spray, Suspension 1 Spray by Each Nostril route Twice daily    lansoprazole (PREVACID SOLUTAB) 15 mg Oral Tablet,Rapid Dissolve, DR Take 15 mg by mouth Once a day    loratadine (CLARITIN) 10 mg Oral Tablet Take 10 mg by mouth Once a day    metoprolol succinate (TOPROL-XL) 25 mg Oral Tablet Sustained Release 24 hr Take 25 mg by mouth Once a day    olmesartan (BENICAR) 40 mg Oral Tablet Take 1 Tab (40 mg total) by mouth Once a day    Olopatadine (PATADAY) 0.2 % Ophthalmic Drops Instill 1 Drop into both eyes Once a day    PROAIR HFA 90 mcg/actuation Inhalation HFA Aerosol Inhaler Take 1-2 Puffs by inhalation Every 6 hours as needed    Syringe with Needle, Disp, (BD ALLERGY SYRINGE) 1  mL 28 gauge x 1/2" Syringe Use 2 syringes weekly for allergy injections    UNKNOWN  MEDICATION (UNKNOWN MEDICATION) Allergy Injections every 7-10 days     Family History  Family History   Problem Relation Age of Onset    Cancer Other     HTN <20 y.o. Other     Cancer Mother     Hypertension Father     Alzheimer's/Dementia Father     Healthy Sister     Healthy Son     Cancer Sister     Healthy Son        Examination:  BP 125/84   Pulse 92   Temp 37 C (98.6 F) (Oral)    Resp 16   Ht 1.829 m (6')   Wt 106.1 kg (233 lb 14.4 oz)   SpO2 98%   BMI 31.72 kg/m2   Wt Readings from Last 2 Encounters:   07/05/17 106.1 kg (233 lb 14.4 oz)   05/25/17 99.8 kg (220 lb)     General: Well built and nourished, obese  Eyes: Conjunctiva clear, sclera non-icteric  HENT: Atraumatic and normocephalic  Neck: No JVD  Lungs: Clear to auscultation bilaterally  Cardiovascular: regular rate and rhythm, S1, S2 normal  Abdomen: Soft, non-tender, Bowel sounds normal  Extremities: No cyanosis or edema  Skin: Skin warm and dry  Neurologic: Alert, Oriented, Grossly normal  Psychiatric: Appears normal    Data/Chart reviewed:  CBC Results Coags Results   No results for input(s): WBC, HGB, HCT, PLTCNT, BANDS in the last 72 hours.    Invalid input(s): PLATELETCOUNT No results for input(s): INR, PROTHROMTME, APTT in the last 72 hours.    Invalid input(s): PTT, PT, CREACTPROTIE   BMP Results Other Chemistries Results   No results for input(s): SODIUM, POTASSIUM, CHLORIDE, CO2, BUN, CREATININE, GFR, ANIONGAP in the last 72 hours. No results for input(s): CALCIUM, ALBUMIN, MAGNESIUM, PHOSPHORUS in the last 72 hours.   Liver/Pancreas Enzyme Results Blood Gas   No results for input(s): TOTALPROTEIN, ALBUMIN, PREALBUMIN, AST, ALT, ALKPHOS, LDH, AMYLASE, LIPASE in the last 72 hours.    Invalid input(s): GGT No results found for this encounter       Assessment and Plan  Danny Danny [name] is a 56 y.o. male who presents with New Patient; Esophageal Reflux;  and Vomiting.     1.   Episodes of esophageal reflux, vomiting:  - most likely secondary to esophagitis, gastritis, duodenitis, peptic ulcer disease.  Recommendations:  - recommend to continue Prevacid at this time.  - requested records from outside hospital for review.  - plan to perform upper endoscopy for further evaluation.  I will change to Protonix if needed, based on endoscopy findings.  - extensively counseled the patient to stop alcohol.    2. Elevated Liver enzymes:   - The most likely in the differential diagnosis include fatty liver disease from alcohol and obesity.   Others in the differential diagnosis include primary biliary cholangitis, autoimmune hepatitis, hematochromatosis, wilsons disease, alpha 1 anti-trypsin deficiency.  - Patient acknowledges  significant alcohol use (18-20 beers a week). Patient denies any prior blood transfusions or blood transfusions prior to 1992, IV drug use, tattoo's by non professionals.   - Ultrasound of the abdomen in 10/2016 showed tiny gallbladder polyp otherwise unremarkable.  Recommendations:  - Requested for additional lab tests as mentioned below.   - I also extensively counseled the patient about the importance of complete abstainance from alcohol, weight reduction, healthy diet, complications of advanced liver disease. Patient acknowledges it and agrees with the recommendation.   - Vaccination: Will check for  viral serologies and consider hepatitis A and B vaccination if not immune. Requested patient to follow up with his primary care physician regarding vaccinations for influenza, Pneumococcus, tetanus, diptheria, pertussis and zoster as clinically indicated. My clinic is not providing these vaccines due to limited resources.     3. Colon cancer screening:   - Patient is at average risk for colon cancer.   Patient reports poor prep on prior colonoscopy.  Recommendations:  - requested records from outside hospital for review.  Plan to perform follow-up  colonoscopy after his upper GI symptoms were addressed.             Informed Consent: The risks/benefits of upper endoscopy including very rare death from complications were discussed with the patient. Alternative to endoscopy were also discussed.  The benefits include detection and removal of pre-cancerous lesions, control of bleeding, and diagnosis of upper digestive pathology.  The risks include perforation (creating a tear in the wall of the lumen). Sometimes this can be treated conservatively, but, other times this requires surgery to fix.  Other risks can include bleeding, especially with the removal of lesions.  There is a small risk of infection post procedure.  In addition, there is a risk of missing lesions.  There is also a risk of adverse reaction to the anesthetic, including cardiopulmonary compromise. The risks were discussed in plain [place].  Patient verbally acknowledges the risks and benefits and wishes to pursue with the endoscopic procedure.    Return to the clinic in 2-4 weeks after the endoscopy procedure.     Orders Placed This Encounter    UPPER ENDOSCOPY ([facility])    ALPHA-1-ANTITRYPSIN    ANA (ANTINUCLEAR ANTIBODIES), SERUM    LIVER/KIDNEY MICROSOME (LKM) TYPE 1 ANTIBODIES, SERUM    MITOCHONDRIAL ANTIBODIES (M2), SERUM    SMOOTH MUSCLE ANTIBODIES, SERUM    CERULOPLASMIN    FERRITIN    IRON    IRON TRANSFERRIN AND TIBC    ANTI-HEPATITIS A IGG    HEPATITIS A IGM ANTIBODY    HEPATITIS B SURFACE ANTIBODY    HEPATITIS B SURFACE ANTIGEN    HEPATITIS C ANTIBODY       Demari Kropp Gopala Jory Sims, MD   [facility]  [place], [place]  [address]

## 2017-07-05 NOTE — Nursing Note (Signed)
Pt here as a new pt with c/o GERD and vomiting

## 2017-07-05 NOTE — Progress Notes (Signed)
[address]  [place] [address]    Progress Note    Name: Danny [name] MRN:  [id number]   Date: 07/05/2017 Age: 56 y.o.       Chief Complaint: Fatigue  Subjective:  Danny [name] was seen back.  His ferritin level was 191. He was unable to complete the sleep study at the sleep lab.  He had some claustrophobia and panicked.  He continues to snore loudly and has history of witnessed apneas.  I interviewed his wife.      Objective :    He was alert and was oriented.  Neurologic exam was non focal.  Blood pressure 110/74.        Assessment:     ENCOUNTER DIAGNOSES     ICD-10-CM   1. Snoring R06.83   2. Fatigue, unspecified type R53.83   3. Morbid obesity with body mass index of 40.0-49.9 (CMS HCC) E66.01   4. Sleep apnea, unspecified type G47.30       Plan:      1. He thinks that he will be able to undergo a home sleep study.  This will be attempted.      2. He is willing to use CPAP if he has sleep apnea.  The chances of him having sleep apnea is quite high.    3. GI workup  Is in progress.        Imberly Troxler Wendee Beavers, MD       This note was partially generated using M[employer] Fluency Direct System.There maybe some incorrect words, spelling, and punctuation that were not noted in checking the note before saving.

## 2017-07-06 ENCOUNTER — Encounter (HOSPITAL_BASED_OUTPATIENT_CLINIC_OR_DEPARTMENT_OTHER): Payer: Self-pay | Admitting: Internal Medicine

## 2017-07-08 ENCOUNTER — Other Ambulatory Visit (HOSPITAL_BASED_OUTPATIENT_CLINIC_OR_DEPARTMENT_OTHER): Payer: Self-pay

## 2017-07-14 ENCOUNTER — Encounter (HOSPITAL_BASED_OUTPATIENT_CLINIC_OR_DEPARTMENT_OTHER): Payer: Self-pay | Admitting: Internal Medicine

## 2017-07-26 ENCOUNTER — Encounter (HOSPITAL_COMMUNITY): Payer: Self-pay

## 2017-07-26 ENCOUNTER — Ambulatory Visit
Admission: RE | Admit: 2017-07-26 | Discharge: 2017-07-26 | Disposition: A | Discharge status: Home / Self Care | Payer: BC Managed Care – PPO | Source: Ambulatory Visit

## 2017-07-26 DIAGNOSIS — R0683 Snoring: Secondary | ICD-10-CM

## 2017-07-26 DIAGNOSIS — R5383 Other fatigue: Secondary | ICD-10-CM

## 2017-07-26 DIAGNOSIS — G4730 Sleep apnea, unspecified: Secondary | ICD-10-CM

## 2017-07-28 ENCOUNTER — Inpatient Hospital Stay
Admission: RE | Admit: 2017-07-28 | Discharge: 2017-07-28 | Disposition: A | Discharge status: Home / Self Care | Payer: BC Managed Care – PPO | Source: Ambulatory Visit | Attending: Internal Medicine | Admitting: Internal Medicine

## 2017-07-28 ENCOUNTER — Ambulatory Visit (HOSPITAL_BASED_OUTPATIENT_CLINIC_OR_DEPARTMENT_OTHER): Payer: BC Managed Care – PPO | Admitting: Certified Registered"

## 2017-07-28 ENCOUNTER — Other Ambulatory Visit (HOSPITAL_COMMUNITY): Payer: Self-pay | Admitting: Internal Medicine

## 2017-07-28 ENCOUNTER — Ambulatory Visit (HOSPITAL_COMMUNITY): Payer: BC Managed Care – PPO | Admitting: Certified Registered"

## 2017-07-28 ENCOUNTER — Encounter (HOSPITAL_COMMUNITY): Payer: Self-pay

## 2017-07-28 ENCOUNTER — Encounter (HOSPITAL_COMMUNITY)
Admission: RE | Disposition: A | Discharge status: Home / Self Care | Payer: Self-pay | Source: Ambulatory Visit | Attending: Internal Medicine

## 2017-07-28 DIAGNOSIS — K317 Polyp of stomach and duodenum: Secondary | ICD-10-CM

## 2017-07-28 DIAGNOSIS — R12 Heartburn: Secondary | ICD-10-CM | POA: Insufficient documentation

## 2017-07-28 DIAGNOSIS — K2970 Gastritis, unspecified, without bleeding: Secondary | ICD-10-CM

## 2017-07-28 DIAGNOSIS — R1110 Vomiting, unspecified: Secondary | ICD-10-CM

## 2017-07-28 DIAGNOSIS — G4730 Sleep apnea, unspecified: Secondary | ICD-10-CM | POA: Insufficient documentation

## 2017-07-28 DIAGNOSIS — F329 Major depressive disorder, single episode, unspecified: Secondary | ICD-10-CM | POA: Insufficient documentation

## 2017-07-28 DIAGNOSIS — E785 Hyperlipidemia, unspecified: Secondary | ICD-10-CM | POA: Insufficient documentation

## 2017-07-28 DIAGNOSIS — I10 Essential (primary) hypertension: Secondary | ICD-10-CM | POA: Insufficient documentation

## 2017-07-28 DIAGNOSIS — K219 Gastro-esophageal reflux disease without esophagitis: Secondary | ICD-10-CM | POA: Insufficient documentation

## 2017-07-28 HISTORY — PX: GASTROSCOPY: WVUENDOPRO49

## 2017-07-28 HISTORY — DX: Sleep apnea, unspecified: G47.30

## 2017-07-28 SURGERY — GASTROSCOPY
Anesthesia: Monitor Anesthesia Care | Wound class: Clean Contaminated Wounds-The respiratory, GI, Genital, or urinary

## 2017-07-28 MED ORDER — PROPOFOL 10 MG/ML IV - CHI
INTRAVENOUS | Status: DC | PRN
Start: 2017-07-28 — End: 2017-07-28
  Administered 2017-07-28: 0 ug/kg/min via INTRAVENOUS
  Administered 2017-07-28 (×2): 120 ug/kg/min via INTRAVENOUS

## 2017-07-28 MED ORDER — SIMETHICONE 40 MG/0.6 ML ORAL DROPS,SUSPENSION
Freq: Once | ORAL | Status: DC | PRN
Start: 2017-07-28 — End: 2017-07-28
  Administered 2017-07-28: 40 mg via ORAL

## 2017-07-28 MED ORDER — SUCRALFATE 100 MG/ML ORAL SUSPENSION: 1 g | mL | Freq: Two times a day (BID) | ORAL | 1 refills | 0 days | Status: AC

## 2017-07-28 MED ORDER — SODIUM CHLORIDE 0.9 % (FLUSH) INJECTION SYRINGE
3.0000 mL | INJECTION | INTRAMUSCULAR | Status: DC | PRN
Start: 2017-07-28 — End: 2017-07-28
  Administered 2017-07-28: 3 mL

## 2017-07-28 MED ORDER — PROPOFOL 10 MG/ML IV BOLUS
INJECTION | Freq: Once | INTRAVENOUS | Status: DC | PRN
Start: 2017-07-28 — End: 2017-07-28
  Administered 2017-07-28: 70 mg via INTRAVENOUS
  Administered 2017-07-28: 80 mg via INTRAVENOUS

## 2017-07-28 MED ORDER — LIDOCAINE (PF) 100 MG/5 ML (2 %) INTRAVENOUS SYRINGE
INJECTION | Freq: Once | INTRAVENOUS | Status: DC | PRN
Start: 2017-07-28 — End: 2017-07-28
  Administered 2017-07-28: 40 mg via INTRAVENOUS
  Administered 2017-07-28: 20 mg via INTRAVENOUS

## 2017-07-28 MED ORDER — LACTATED RINGERS INTRAVENOUS SOLUTION
INTRAVENOUS | Status: DC
Start: 2017-07-28 — End: 2017-07-28

## 2017-07-28 MED ADMIN — sodium chloride 0.9 % (flush) injection syringe: @ 12:00:00

## 2017-07-28 MED ADMIN — sodium chloride 0.9 % intravenous solution: INTRAVENOUS | @ 13:00:00 | NDC 00338004904

## 2017-07-28 MED ADMIN — nystatin 100,000 unit/gram topical ointment: INTRAVENOUS | @ 13:00:00 | NDC 45802004835

## 2017-07-28 SURGICAL SUPPLY — 42 items
BITE BLOCK 60FR MAJ1632_50EA/BX (AIR) ×2
BLOCK BITE 60FR ADULT STRAP FLXB SH LF  DISP (AIR) ×1 IMPLANT
BRUSH CYTO 1.5MM 140CM BLT TIP ERG HNDL ATO STP SHEATH CLBR STRL DISP PTFE BRONCHSCP (CANNULA) IMPLANT
BRUSH CYTOLOGY BRONCHOSCOPY_ERG HNDL ATO STP SHEATH CLBR (CANNULA)
CATH ELHMST INJ GLD PRB 7FR 25_GA .24MM 210CM BIPO RND DIST (BALLOON)
CATH ELHMST INJ GLD PROBE 7FR 25GA .24MM 210CM BIPOLAR RND DIST TIP STD CONN INTGR DISP 2.8MM MN WRK (BALLOON) IMPLANT
CLIP HMST MR CONDITIONAL BRD CATH ROT CONTROL KNOB NO SHEATH RSL 360 235CM 2.8MM 11MM OPN (SURGICAL INSTRUMENTS) IMPLANT
DEVICE HEMOSTASIS CLIP 360_235CM RESOLUTION (INSTRUMENTS)
DISCONTINUED USE ITEM 328361 - JELLY LUB EZ BCTRST H2O SOL NG_RS FLPTP TUBE STRL 2OZ LF (MISCELLANEOUS PT CARE ITEMS) IMPLANT
FORCEPS BIOPSY HOT 240CM 2.2MM RJ 4 +2.8MM DISP (INSTRUMENTS)
FORCEPS BIOPSY HOT 240CM 2.2MM RJ 4 +2.8MM DISPO (SURGICAL INSTRUMENTS) IMPLANT
FORCEPS BIOPSY HOT 240CM 2.2MM_RJ 4 +2.8MM DISP (INSTRUMENTS)
FORCEPS BIOPSY LRG CPC NEEDLE 240CM 2.2MM RJ 3 DISP ORNG (ENDOSCOPIC SUPPLIES) ×1 IMPLANT
FORCEPS BIOPSY NEEDLE 160CM 1.8MM RJ 4 DISP YW 2MM WRK CHNL GSPED (SURGICAL INSTRUMENTS) IMPLANT
FORCEPS BIOPSY NEEDLE 160CM 1._8MM RJ 4 PED 2+ MM DISP (INSTRUMENTS)
FORCEPS BIOPSY NEEDLE 240CM RJ 4 JMB (SURGICAL INSTRUMENTS) IMPLANT
FORCEPS BIOPSY NEEDLE 240CM RJ_4 JMB DISP (INSTRUMENTS)
FORCEPS BIOPSY NEEDLE 240CM RJ_4 LRG CPC DISP (INSTRUMENTS ENDOMECHANICAL) ×2
GW ENDOSCOPIC .035IN 260CM DREAMWIRE ANG RX EGLD NITINOL BIL STRL DISP (ENDOSCOPIC SUPPLIES) IMPLANT
JELLY LUB EZ BCTRST H2O SOL NG_RS FLPTP TUBE STRL 2OZ LF (MISCELLANEOUS PT CARE ITEMS)
KIT CAN MDVC ASCP SPECI CNVRT_NONST LF (TEST)
LIGATOR 2.8MM 8.6-11.5MM SSS7 ESOPH 1 STNG MLT BAND HNDL STRL DISP ENDOS HMSTS LF (GI LAB SUPPLIES) IMPLANT
LIGATOR 2.8MM 8.6-11.5MM SSS7_ESOPH 1 STNG MLT BAND ERG HNDL (GI LAB SUPPLIES)
MARKER ENDOS PMT ENDOMARK DIL INDIA INK 10ML STRL (GI LAB SUPPLIES) IMPLANT
NEEDLE SCLRTX 25GA 2.3MM BVL STRL DISP STAR CATH INTJCT 4MM 240CM (NEEDLES & SYRINGE SUPPLIES) IMPLANT
NEEDLE SCLRTX 25GA 2.3MM BVL S_TRL DISP STAR CATH INTJCT 4MM (NEEDLES & SYRINGE SUPPLIES)
NET SPEC RETR 230CM 2.5MM RTHNT STD SHEATH 6X3CM NONST LF  DISP (Dilators) IMPLANT
NET SPEC RETR 230CM 2.5MM RTHN_T STD SHTH 6X3CM NONST LF (Dilators)
PEN SURG MRKNG INDIA INK (GI LAB SUPPLIES)
PROBE ESURG 220CM 2.3MM FIAPC FLXB STR FIRE STRL DISP (CAUTERY SUPPLIES) IMPLANT
PROBE ESURG 220CM 2.3MM FIAPC_FLXB STR FIRE ARGON PLAS COAG (CAUTERY SUPPLIES)
SNARE 230CM 2.5MM 3CM PLPK PLP_CTM OVL ENDOS 6CM STRL DISP (INT)
SNARE 230CM 2.5MM 3CM PLPK ROTR RTHNT ENDOS NONST LF  DISP (INT) IMPLANT
SNARE LRG OVL MED STF ENDOS OL_MPS DISP (INSTRUMENTS ENDOMECHANICAL)
SNARE MED OVAL 240CM 2.4MM CAPTIVATR STF ENDOS PLYP 27MM DISP (UROLOGICAL SUPPLIES) IMPLANT
SNARE MED OVL 240CM 2.4MM CAPT_IVATR STF ENDOS PLYP 27MM DISP (UROLOGICAL SUPPLIES)
SNARE RND 240MM 25MM 2.4MM CAPTIVATR II STF BRD WRE LOOP ENDOS PLPCTM 2.8MM DISP (ENDOSCOPIC SUPPLIES) ×1 IMPLANT
SNARE RND 240MM 25MM 2.4MM CAP_TIVATR II STF BRD WRE LOOP (INSTRUMENTS ENDOMECHANICAL) ×2
SOCK CAN MEDIVAC ASCP SPECI CNVRT NONST LF (TEST) IMPLANT
TRAY GASTRIC LAV 36IN 34FR ARGYLE EDLICH MONOJECT LRG PVC 4 EYE CLS END GRAD SYRG TRNSPR 140CC NONST (TRAY) IMPLANT
TRAY GASTRIC LAV 36IN 34FR ARG_YLE EDLICH MONOJECT LRG PVC 4 (TRAY)
USE ITEM 154679 SNARE RND 240MM 25MM 2.4MM CAPTIVATR II STF BRD WRE LOOP ENDOS PLPCTM 2.8MM DISP (ENDOSCOPIC SUPPLIES) ×1 IMPLANT

## 2017-07-28 NOTE — H&P (Signed)
[facility]  Operating Room H&P/Update    Date:   07/05/2017  Name: Danny [name]  Age: 56 y.o.    Primary Care Provider:  Wonda Horner., MD    HPI:    Danny [name] is a pleasent 56 y.o. male with past medical history as mentioned below is in the endoscopic unit for further evaluation of  Episodes of esophageal reflux, vomiting    Patient is planned for EGD today as previously recommended. There is no change in interval history from prior visit.     Review of Systems:  Constitutional: Denies fevers, chills  Cardiovascular: Denies chest pain  Neurological: Denies seizures    Past Medical History  Past Medical History:   Diagnosis Date    Elevated lipids     GERD (gastroesophageal reflux disease)     Hypertension     Sleep apnea     Vomiting          Past Surgical History:   Procedure Laterality Date    COLONOSCOPY  2015    [facility]    ESOPHAGOGASTRODUODENOSCOPY  2015    [facility]    HX ELBOW SURGERY Left 2011    HX HAND SURGERY Left 06/2013    incision and drainage    HX HAND SURGERY Left     incision and drainage    HX WISDOM TEETH EXTRACTION           Family Medical History     Problem Relation (Age of Onset)    Alzheimer's/Dementia Father    Cancer Other, Mother, Sister    HTN <20 y.o. Other    Healthy Sister, Son, Son    Hypertension Father            Social History     Social History    Marital status: Married     Spouse name: N/A    Number of children: N/A    Years of education: N/A     Social History Main Topics    Smoking status: Never Smoker    Smokeless tobacco: Never Used    Alcohol use 18.0 oz/week     30 Cans of beer per week    Drug use: No    Sexual activity: Yes     Partners: Female     Patent examiner protection: None     Other Topics Concern    Right Hand Dominant Yes    Ability To Walk 2 Flight Of Steps Without Sob/Cp Yes    Ability To Do Own Adl's Yes     Social History Narrative     @MEDSTAKING @  Allergies   Allergen Reactions    Percocet  [Oxycodone] Anaphylaxis       Examination:  BP 123/80   Pulse 68   Temp 36.6 C (97.9 F)   Resp 16   Ht 1.829 m (6')   Wt 99.8 kg (220 lb)   SpO2 97%   BMI 29.84 kg/m2   Wt Readings from Last 2 Encounters:   07/28/17 99.8 kg (220 lb)   07/05/17 106.1 kg (233 lb 14.4 oz)     Head: Atraumatic and normocephalic  Eyes: Conjunctiva clear, sclera non-icteric  Neck: No JVD  Lungs: Normal reparatory effort bilaterally  Cardiovascular: Regular rate  Abdomen: Soft, non-tender, non-distended  Neurologic: Alert, Oriented, Grossly normal  Psychiatric: Appears normal    CBC Results Coags Results   No results for input(s): WBC, HGB, HCT, PLTCNT, BANDS in the last 72  hours.    Invalid input(s): PLATELETCOUNT No results for input(s): INR, PROTHROMTME, APTT in the last 72 hours.    Invalid input(s): PTT, PT, CREACTPROTIE   BMP Results Other Chemistries Results   No results for input(s): SODIUM, POTASSIUM, CHLORIDE, CO2, BUN, CREATININE, GFR, ANIONGAP in the last 72 hours. No results for input(s): CALCIUM, ALBUMIN, MAGNESIUM, PHOSPHORUS in the last 72 hours.   Liver/Pancreas Enzyme Results Blood Gas   No results for input(s): TOTALPROTEIN, ALBUMIN, PREALBUMIN, AST, ALT, ALKPHOS, LDH, AMYLASE, LIPASE in the last 72 hours.    Invalid input(s): GGT No results found for this encounter     Assessment  Danny [name] is a 56 y.o. male is seen in the endoscopy unit    Plan   Informed consent was obtained.   Proceed to endoscopy procedure as previously planned.     Danny [name] [place], [place] - Gastroenterology  [place], [place]  [address]

## 2017-07-28 NOTE — Anesthesia Postprocedure Evaluation (Signed)
Anesthesia Post Op Evaluation    Patient: Danny [name]  Procedure(s) with comments:  GASTROSCOPY w/ Bx - 30 min    Last Vitals:Temperature: 36.6 C (97.9 F) (07/28/17 1150)  Heart Rate: 84 (07/28/17 1304)  BP (Non-Invasive): 126/82 (07/28/17 1304)  Respiratory Rate: 18 (07/28/17 1304)  SpO2-1: 96 % (07/28/17 1304)  Patient is sufficiently recovered from the effects of anesthesia to participate in the evaluation and has returned to their pre-procedure level.   Post-procedure handoff checklist completed    Patient participation: complete - patient participated  Level of consciousness: awake and alert  Pain score: 0  Pain management: adequate  Airway patency: patent  Anesthetic complications: no  Cardiovascular status: acceptable  Respiratory status: acceptable  Hydration status: acceptable  Patient post-procedure temperature: Pt Normothermic   PONV Status: Absent

## 2017-07-28 NOTE — Discharge Instructions (Signed)
EGD discharge instructions and handout reviewed with patient and spouse

## 2017-07-28 NOTE — Anesthesia Preprocedure Evaluation (Addendum)
ANESTHESIA PRE-OP EVALUATION  Planned Procedure: GASTROSCOPY (N/A )  Review of Systems     anesthesia history negative     patient summary reviewed  nursing notes reviewed        Pulmonary   sleep apnea,   Cardiovascular    Hypertension and well controlled   ,beta blocker therapy  ,beta blockers in last 24 hours (this am )     GI/Hepatic/Renal    GERD and poorly controlled     Endo/Other          Neuro/Psych/MS   depression, Substance abuse (occasional beer ) and alcohol     Cancer  negative hematology/oncology ROS,              Physical Assessment      Patient summary reviewed and Nursing notes reviewed   Airway       Mallampati: II    TM distance: >3 FB    Neck ROM: full  Mouth Opening: good.            Dental                    Pulmonary    Breath sounds clear to auscultation       Cardiovascular    Rhythm: regular  Rate: Normal       Other findings            Plan  Planned anesthesia type: TIVA    ASA 2     Intravenous induction     Anesthetic plan and risks discussed with patient.     Anesthesia issues/risks discussed are: PONV, Dental Injuries, Cardiac Events/MI, Aspiration, Intraoperative Awareness/ Recall and Sore Throat.        Patient's NPO status is appropriate for Anesthesia.           Plan discussed with CRNA.

## 2017-08-02 ENCOUNTER — Encounter (HOSPITAL_BASED_OUTPATIENT_CLINIC_OR_DEPARTMENT_OTHER): Payer: Self-pay

## 2017-08-04 ENCOUNTER — Encounter: Payer: Self-pay | Admitting: Specialist

## 2017-08-24 ENCOUNTER — Ambulatory Visit: Payer: BC Managed Care – PPO | Admitting: Internal Medicine

## 2017-08-24 ENCOUNTER — Encounter (HOSPITAL_BASED_OUTPATIENT_CLINIC_OR_DEPARTMENT_OTHER): Payer: Self-pay

## 2017-08-24 VITALS — BP 125/85 | HR 84 | Temp 97.6°F | Resp 12 | Ht 72.0 in | Wt 240.8 lb

## 2017-08-24 DIAGNOSIS — K219 Gastro-esophageal reflux disease without esophagitis: Secondary | ICD-10-CM

## 2017-08-24 DIAGNOSIS — Z789 Other specified health status: Secondary | ICD-10-CM

## 2017-08-24 DIAGNOSIS — Z6832 Body mass index (BMI) 32.0-32.9, adult: Secondary | ICD-10-CM

## 2017-08-24 DIAGNOSIS — Z1211 Encounter for screening for malignant neoplasm of colon: Secondary | ICD-10-CM

## 2017-08-24 DIAGNOSIS — Z7289 Other problems related to lifestyle: Secondary | ICD-10-CM

## 2017-08-24 DIAGNOSIS — K317 Polyp of stomach and duodenum: Secondary | ICD-10-CM

## 2017-08-24 DIAGNOSIS — R1111 Vomiting without nausea: Secondary | ICD-10-CM

## 2017-08-24 DIAGNOSIS — R748 Abnormal levels of other serum enzymes: Principal | ICD-10-CM | POA: Insufficient documentation

## 2017-08-24 MED ORDER — PEG 3350-ELECTROLYTES 236 GRAM-22.74 GRAM-6.74 GRAM-5.86 GRAM SOLUTION
4.0000 L | Freq: Once | ORAL | 0 refills | Status: AC
Start: 2017-08-24 — End: 2017-08-24

## 2017-08-24 MED ORDER — BISACODYL 5 MG TABLET
4.0000 | ORAL_TABLET | Freq: Once | ORAL | 0 refills | Status: AC
Start: 2017-08-24 — End: 2017-08-24

## 2017-08-24 NOTE — Progress Notes (Signed)
[facility]                                                      Department of Gastroenterology  [place], [place] [address]    Date:   08/24/2017  Name: Danny [name]  Age: 56 y.o.    Referring Provider:    Wonda [name]., MD  [place]  [address]  [place], [place] [address]    Primary Care Provider:    Wonda [name]., MD    Chief Complaint: After Procedure Follow Up (EGD 07/28/17) and No Complaints    History of Present Illness  [name] is a pleasent 56 y.o. male, who is a self-employed businessman, with past medical history as mentioned below is consulted for After Procedure Follow Up (EGD 07/28/17) and No Complaints. The history is obtained from the patient.     Patient states that his symptoms have significantly improved on Carafate.  Patient denies any GI complaints.  Patient states that he has significantly decreased his alcohol intake with the goal of stopping its soon.       Review of Systems  Constitutional: Denies fevers, chills  Eyes: Denies changes in vision  Respiratory: Denies dyspnea on exertion  Cardiovascular: Denies chest pain  Gastrointestinal: unremarkable except as mentioned above  Genitourinary: Denies dysuria  Hematologic/lymphatic: Denies bleeding   Neurological: Denies seizures  Behavioral/Psych: Denies depression  Skin:  Denies any ulcers    Past Medical History  Past Medical History:   Diagnosis Date   . Elevated lipids    . GERD (gastroesophageal reflux disease)    . Hypertension    . Sleep apnea    . Vomiting          Past Surgical History:   Procedure Laterality Date   . [facility]OSCOPY  2015    [facility]   . ESOPHAGOGASTRODUODENOSCOPY  2015    [place]  07/28/2017    Dr. Stacie Glaze    . HX ELBOW SURGERY Left 2011   . HX HAND SURGERY Left 06/2013    incision and drainage   . HX HAND SURGERY Left     incision and drainage   . HX WISDOM TEETH EXTRACTION           Social History  Social History     Social History   . Marital  status: Married     Spouse name: N/A   . Number of children: N/A   . Years of education: N/A     Occupational History   . Not on file.     Social History Main Topics   . Smoking status: Never Smoker   . Smokeless tobacco: Never Used   . Alcohol use 18.0 oz/week     30 Cans of beer per week   . Drug use: No   . Sexual activity: Yes     Partners: Female     Birth control/ protection: None     Other Topics Concern   . Right Hand Dominant Yes   . Ability To Walk 2 Flight Of Steps Without Sob/Cp Yes   . Ability To Do Own Adl's Yes     Social History Narrative     Allergies   Allergen Reactions   .  Percocet [Oxycodone] Anaphylaxis     Current Outpatient Prescriptions   Medication Sig   . amLODIPine (NORVASC) 5 mg Oral Tablet Take 5 mg by mouth Once a day   . ARIPiprazole (ABILIFY) 5 mg Oral Tablet Take 5 mg by mouth Once a day   . aspirin 81 mg Oral Tablet, Chewable Take 81 mg by mouth Once a day   . atorvastatin (LIPITOR) 40 mg Oral Tablet Take 1 Tab (40 mg total) by mouth Every night   . Bisacodyl 5 mg Oral Tablet Take 4 Tabs (20 mg total) by mouth One time for 1 dose For colonoscopy as instructed   . cholecalciferol, vitamin D3, 1,000 unit Oral Tablet Take 1,000 Units by mouth Once a day   . Colesevelam [facility]) 3.75 gram Oral Powder in Packet Take 3.75 g by mouth Once a day   . cyanocobalamin (VITAMIN B 12) 1,000 mcg Oral Tablet Take 1,000 mcg by mouth Once a day   . diphenhydrAMINE (BENADRYL) 25 mg Oral Capsule Take 25 mg by mouth Every 6 hours as needed   . EPINEPHrine (EPIPEN) 0.3 mg/0.3 mL Injection Auto-Injector 0.3 mL (0.3 mg total) by Intramuscular route Once, as needed for up to 1 dose   . Fluoxetine (PROZAC) 40 mg Oral Capsule Take 40 mg by mouth Once a day HVL   . fluticasone (FLONASE) 50 mcg/actuation Nasal Spray, Suspension 1 Spray by Each Nostril route Twice daily   . lansoprazole (PREVACID SOLUTAB) 15 mg Oral Tablet,Rapid Dissolve, DR Take 15 mg by mouth Once a day   . loratadine (CLARITIN) 10 mg Oral  Tablet Take 10 mg by mouth Once a day   . metoprolol succinate (TOPROL-XL) 25 mg Oral Tablet Sustained Release 24 hr Take 25 mg by mouth Once a day   . olmesartan (BENICAR) 40 mg Oral Tablet Take 1 Tab (40 mg total) by mouth Once a day   . Olopatadine (PATADAY) 0.2 % Ophthalmic Drops Instill 1 Drop into both eyes Once a day   . PEG 3350-Electrolytes 236-22.74-6.74 -5.86 gram Oral Recon Soln Take 4,000 mL by mouth One time for 1 dose For Colonoscopy as instructed.   Marland Kitchen PROAIR HFA 90 mcg/actuation Inhalation HFA Aerosol Inhaler Take 1-2 Puffs by inhalation Every 6 hours as needed   . sucralfate (CARAFATE) 100 mg/mL Oral Suspension Take 10 mL (1 g total) by mouth Twice a day before meals for 30 days   . Syringe with Needle, Disp, (BD ALLERGY SYRINGE) 1 mL 28 gauge x 1/2" Syringe Use 2 syringes weekly for allergy injections   . UNKNOWN MEDICATION (UNKNOWN MEDICATION) Allergy Injections every 7-10 days     Family History  Family History   Problem Relation Age of Onset   . Cancer Other    . HTN <20 y.o. Other    . Cancer Mother    . Hypertension Father    . Alzheimer's/Dementia Father    . Healthy Sister    . Healthy Son    . Cancer Sister    . Healthy Son        Examination:  BP 125/85  Pulse 84  Temp 36.4 C (97.6 F) (Oral)   Resp 12  Ht 1.829 m (6')  Wt 109.2 kg (240 lb 12.8 oz)  SpO2 97%  BMI 32.66 kg/m2   Wt Readings from Last 2 Encounters:   08/24/17 109.2 kg (240 lb 12.8 oz)   07/28/17 99.8 kg (220 lb)     General: Well built and nourished,  obese  Eyes: Conjunctiva clear, sclera non-icteric  HENT: Atraumatic and normocephalic  Neck: No JVD  Lungs: Clear to auscultation bilaterally  Cardiovascular: regular rate and rhythm, S1, S2 normal  Abdomen: Soft, non-tender, Bowel sounds normal  Extremities: No cyanosis or edema  Skin: Skin warm and dry  Neurologic: Alert, Oriented, Grossly normal  Psychiatric: Appears normal    Data/Chart reviewed:  CBC Results Coags Results   No results for input(s): WBC, HGB, HCT,  PLTCNT, BANDS in the last 72 hours.    Invalid input(s): PLATELETCOUNT No results for input(s): INR, PROTHROMTME, APTT in the last 72 hours.    Invalid input(s): PTT, PT, CREACTPROTIE   BMP Results Other Chemistries Results   No results for input(s): SODIUM, POTASSIUM, CHLORIDE, CO2, BUN, CREATININE, GFR, ANIONGAP in the last 72 hours. No results for input(s): CALCIUM, ALBUMIN, MAGNESIUM, PHOSPHORUS in the last 72 hours.   Liver/Pancreas Enzyme Results Blood Gas   No results for input(s): TOTALPROTEIN, ALBUMIN, PREALBUMIN, AST, ALT, ALKPHOS, LDH, AMYLASE, LIPASE in the last 72 hours.    Invalid input(s): GGT No results found for this encounter                                           Brief history    Initial visit: 06/2017: [name] states that he has chronic symptoms of acid reflux since his 89s.  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  Patient states that he had history of tendinitis at that time and was prescribed cortisone and Excedrin for many years.  Patient states that at the age of 52 he started to have symptoms of abdominal pain, acid reflux and hoarseness of his voice.  Patient states that he was started on Prevacid at that time.  Patient acknowledges multiple endoscopies since then.  Patient states that his last endoscopy was in 2015.  Patient states that he had both EGD and colonoscopy at that time.  Patient states that the endoscopy showed inflammation in the stomach otherwise unremarkable.  The colonoscopy prep was poor.  Recommend repeat exam.  Patient states that he did not had repeat exam since.  Patient states that since 12/2016 he started to have symptoms of vomiting. Patient states that he has approximately 5 episodes of vomiting a week.  Patient states that his symptoms of vomiting occurs mainly 1 hr after breakfast.  Patient states that he is taking Prevacid 20 mg once a day with suboptimal relief of symptoms.  Patient acknowledges significant alcohol use, approximately 5-6 beers a day.   Marland KitchenHe  denies any difficulty with swallowing, abdominal pain, abdominal bloating, constipation, diarrhea, rectal bleeding, melena, hematemesis, unintentional weight loss or other complaints. Danny [name] acknowledges prior EGD and colonoscopy. He denies cholecystectomy, intestinal surgeries.     On chart review, Danny [name] was noted to have   EGD on 05/09/2014 showed findings concerning for gastroesophageal reflux disease. Irritation of esophageal mucosa, concerning for Candida esophagitis.  Otherwise unremarkable exam.   Colonoscopy on 05/09/2014 showed poor prep  Hence small lesions could have been missed.  Recommend repeat colonoscopy in 1 year at that time.   Labs in 10/2016 which showed unremarkable CBC.  Normal BUN/creatinine.  Mildly elevated AST/ALT otherwise unremarkable liver enzymes.   Ultrasound of the abdomen on 10/23/2016 showed possible tiny gallbladder polyp otherwise unremarkable.   Labs in 04/2017 showed unremarkable CBC.  normal BUN/creatinine.  Mildly elevated AST/ALT  otherwise unremarkable liver enzymes.  Normal TSH.    Pertinent workup since initial visit:   Labs in 06/2017 showed unremarkable liver kidney microsomal antibody, ANA, ceruloplasmin, alpha-1 antitrypsin, ferritin, transferrin.  Hepatitis-B surface antigen, hepatitis a IgM, total antibody, hepatitis-B surface antibody, hepatitis-C antibody were negative.  Anti smooth muscle antibody, antimitochondrial antibody   EGD on 07/28/2017 showed Normal esophagus. Biopsied. Z-line, 36 cm from the incisors. Bilious fluid in the gastric body. Gastritis. Biopsied. A single gastric polyp. Resected and retrieved. Normal first part of the duodenum and 2nd part of the duodenum. Path report:  Distal esophageal biopsies were unremarkable.  Gastric polyp biopsy showed findings consistent with hyperplastic and fundic gland polyp.  Indefinite for low-grade dysplasia.  No evidence of high-grade dysplasia or malignancy.  Gastric body biopsy showed  mild chronic inflammation.  Negative for H pylori. Note  Regarding gastric polyp: Due to significant background inflammation, the presence of low-grade dysplasia cannot be confirmed or excluded. Appropriate clinical follow-up recommended. Started Carafate 1 g 2 times a day.    Assessment and Plan  Danny [name] is a 56 y.o. male who presents with After Procedure Follow Up (EGD 07/28/17) and No Complaints.     1.   Episodes of esophageal reflux, vomiting:  - most likely secondary to bile reflux.  Recommendations:  - patient's symptoms significantly improved on Carafate.  Patient states that he is taking Carafate as needed, approximately 4 times a week.  Recommend to continue it    2. Elevated Liver enzymes:   - The most likely in the differential diagnosis include fatty liver disease from alcohol and obesity.    - hepatitis panel was otherwise unremarkable.  - Patient acknowledges  significant alcohol use (18-20 beers a week).   - Ultrasound of the abdomen in 10/2016 showed tiny gallbladder polyp otherwise unremarkable.  Recommendations:  - Requested for additional lab tests as mentioned below.   If patient has persistent elevation in liver enzymes in spite completely stopping alcohol I will consider liver biopsy.  - I also extensively counseled the patient about the importance of complete abstainance from alcohol, weight reduction, healthy diet, complications of advanced liver disease. Patient acknowledges it and agrees with the recommendation.   - Vaccination:   Requested for hepatitis a and B vaccination. Requested patient to follow up with his primary care physician regarding vaccinations for influenza, Pneumococcus, tetanus, diptheria, pertussis and zoster as clinically indicated. My clinic is not providing these vaccines due to limited resources.     3.   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  Recommendations:  - recommend repeat EGD in 3  months for re-evaluation.    4. Colon cancer screening:   - colonoscopy in 2015 was poor prep.  Recommendations:  - recommend repeat colonoscopy now for re-evaluation.           Return to the clinic in 2-4 weeks after the endoscopy procedure.     Orders Placed This Encounter   . UPPER ENDOSCOPY [facility])   . LOWER ENDOSCOPY [facility])   . HELP PROCEDURE (AMB ONLY)   . HELP PROCEDURE (AMB ONLY)   . CBC   . COMPREHENSIVE METABOLIC PANEL, NON-FASTING   . PT/INR   . Bisacodyl 5 mg Oral Tablet   . PEG 3350-Electrolytes 236-22.74-6.74 -5.86 gram Oral Recon Soln       Venu Gopala Jory Sims, MD   [facility]  [place], [place]  [address]

## 2017-08-24 NOTE — Nursing Note (Signed)
Patient here to follow up from EGD--07/28/17. No complaints at this time.

## 2017-08-30 ENCOUNTER — Other Ambulatory Visit (HOSPITAL_BASED_OUTPATIENT_CLINIC_OR_DEPARTMENT_OTHER): Payer: Self-pay | Admitting: Internal Medicine

## 2017-08-31 ENCOUNTER — Encounter (HOSPITAL_BASED_OUTPATIENT_CLINIC_OR_DEPARTMENT_OTHER): Payer: Self-pay | Admitting: Internal Medicine

## 2017-09-09 ENCOUNTER — Other Ambulatory Visit (INDEPENDENT_AMBULATORY_CARE_PROVIDER_SITE_OTHER): Payer: BC Managed Care – PPO

## 2017-09-09 DIAGNOSIS — J3089 Other allergic rhinitis: Principal | ICD-10-CM

## 2017-09-09 NOTE — Nursing Note (Signed)
[facility]              Phone: [phone]   [address] [place], [place] [address]          Fax:     [phone]  Allergy Department Phone [phone]      ALLERGY SERUM  - ORDER FORM       Date Order was Received :   09/06/2017     Patient Name:    Danny [name]                            DOB: [birth year]  Pickup: yes    Call - Phone # [phone]    INSURANCE:(Copy of card required if coverage has changed.) Home MAILING ADDRESS FOR VIALS   Primary:   Payor: [place] / Plan: [place] BC/BS PPO / Product Type: PPO /   Danny [name]   [phone]  [place] [address]     Where do you receive your allergy injections? home    Any systemic or major local reactions with previous vial?  No    Are you currently taking oral allergy medications or nose sprays in addition to your allergy injections?  Yes  Noticed any improvement with previous vial?  Yes  Are you currently on a beta-blocker? No   List of Mico's medications:    Outpatient Medications Prior to Visit:  amLODIPine (NORVASC) 5 mg Oral Tablet Take 5 mg by mouth Once a day   ARIPiprazole (ABILIFY) 5 mg Oral Tablet Take 5 mg by mouth Once a day   aspirin 81 mg Oral Tablet, Chewable Take 81 mg by mouth Once a day   atorvastatin (LIPITOR) 40 mg Oral Tablet Take 1 Tab (40 mg total) by mouth Every night   cholecalciferol, vitamin D3, 1,000 unit Oral Tablet Take 1,000 Units by mouth Once a day   Colesevelam [facility]) 3.75 gram Oral Powder in Packet Take 3.75 g by mouth Once a day   cyanocobalamin (VITAMIN B 12) 1,000 mcg Oral Tablet Take 1,000 mcg by mouth Once a day   diphenhydrAMINE (BENADRYL) 25 mg Oral Capsule Take 25 mg by mouth Every 6 hours as needed   EPINEPHrine (EPIPEN) 0.3 mg/0.3 mL Injection Auto-Injector 0.3 mL (0.3 mg total) by Intramuscular route Once, as needed for up to 1 dose   Fluoxetine (PROZAC) 40 mg Oral Capsule Take 40 mg by mouth Once a day HVL   fluticasone (FLONASE) 50 mcg/actuation  Nasal Spray, Suspension 1 Spray by Each Nostril route Twice daily   lansoprazole (PREVACID SOLUTAB) 15 mg Oral Tablet,Rapid Dissolve, DR Take 15 mg by mouth Once a day   loratadine (CLARITIN) 10 mg Oral Tablet Take 10 mg by mouth Once a day   metoprolol succinate (TOPROL-XL) 25 mg Oral Tablet Sustained Release 24 hr Take 25 mg by mouth Once a day   olmesartan (BENICAR) 40 mg Oral Tablet Take 1 Tab (40 mg total) by mouth Once a day   Olopatadine (PATADAY) 0.2 % Ophthalmic Drops Instill 1 Drop into both eyes Once a day   PROAIR HFA 90 mcg/actuation Inhalation HFA Aerosol Inhaler Take 1-2 Puffs by inhalation Every 6 hours as needed   Syringe with Needle, Disp, (BD ALLERGY SYRINGE) 1 mL 28 gauge x 1/2" Syringe Use 2 syringes weekly for allergy injections   UNKNOWN MEDICATION (UNKNOWN MEDICATION) Allergy Injections every 7-10 days  No facility-administered medications prior to visit.   See above list of meds for adjunct medication currently taking in conjunction with allergy injections.  SERUM requested:  Vial # FREQUENCY DATE LAST Injection DOSAGE   1   Every 7-10 days 9/14 0.5   2     0.5   3  4    0.1                            0.5       Allergy Serum Mixed on 09/09/2017 by Danny [name]:   Payor: [place] / Plan: [place] BC/BS PPO / Product Type: PPO /     MAIV 5 UNITS #1 start @ 0.4, 0.5  MAIV 5 UNITS #2 start @ 0.4, 0.5  MAIV 5 UNITS #4 start @ 0.4, 0.5  SAIV 1 UNIT #3 start @ 0.10  Allergy Serum Mixed in NSS  Mixing Board Batch #: [id number]    Vials and dosage instructions will be mailed to Danny [name] at:  [address]  [place] [name] [phone] or the address of the patients preferred medical facility.    Or Danny [name] will be notified to pick up [phone]      09/09/2017-Danny [name]-1961/03/01-Payor: BLUE CROSS BLUE SHIELD / Plan: [place] BC/BS PPO / Product Type: PPO /

## 2017-09-16 ENCOUNTER — Encounter (HOSPITAL_BASED_OUTPATIENT_CLINIC_OR_DEPARTMENT_OTHER): Payer: Self-pay | Admitting: Otolaryngology

## 2017-09-16 ENCOUNTER — Ambulatory Visit: Payer: BC Managed Care – PPO | Admitting: Otolaryngology

## 2017-09-16 VITALS — Resp 18 | Ht 72.0 in | Wt 244.7 lb

## 2017-09-16 DIAGNOSIS — Z6833 Body mass index (BMI) 33.0-33.9, adult: Secondary | ICD-10-CM

## 2017-09-16 DIAGNOSIS — J3089 Other allergic rhinitis: Secondary | ICD-10-CM

## 2017-09-16 DIAGNOSIS — Z9289 Personal history of other medical treatment: Secondary | ICD-10-CM

## 2017-09-16 NOTE — Progress Notes (Signed)
[facility]  [address]  [place] [address]  [phone]      Date: 09/16/2017  Name: Danny [name]  Age: 56 y.o.  DOB:  [birth year]    Chief Complaint: Allergic Rhinitis    History of Present Illness:     Danny [name] is a 56 y.o. male presents today to follow up on allergy immunotherapy care. Danny [name] is currently on immunotherapy with [facility] ENT Allergy department. He reports that his wife gives him his injection at home. Allergy Injections are received every 1 week(s). Danny [name] has not had signs of local reactions at the the injection site and denies systemic reactions. He is noticing relief when allergy shots are received on a regular basis. The relief of symptoms typically last 5-7 days. Patient is currently on adjunctive medications. These medications include Benadryl and Claritin as needed. Symptoms have included sinus and nasal congestion, sore throat, nasal blockage and post nasal drip. Control of symptoms is good at this time. Danny [name] voices overall improvement since beginning immunotherapy.     DATE OF ALLERGY TESTING:  04/26/2015    DATE OF FIRST INJECTION:  05/01/2015    Current allergy dosage is   Vial   # DOSE WEAKEST  DILUTION   1 0.5 2   2  0.5 1   3  0.10 4   4  0.5 1       Past Medical History:     Past Medical History:   Diagnosis Date   . Elevated lipids    . GERD (gastroesophageal reflux disease)    . Hypertension    . Sleep apnea    . Vomiting              Allergies   Allergen Reactions   . Percocet [Oxycodone] Anaphylaxis     Social History   Substance Use Topics   . Smoking status: Never Smoker   . Smokeless tobacco: Never Used   . Alcohol use 18.0 oz/week     30 Cans of beer per week        Review of Systems:     CONSTITUTIONAL: negative for fevers, chills and fatigue  RESPIRATORY: negative for cough or wheezing  SKIN:  negative for rash and dryness  ENT:  Negative for the remainder of the ENT review of systems except as documented in the HPI.    Physical  Examination:     Resp 18  Ht 1.829 m (6')  Wt 111 kg (244 lb 11.2 oz)  BMI 33.19 kg/m2    GENERAL:  Patient is in no acute distress.  HEAD:  Head is normocephalic, atraumatic. No palpable salivary gland masses.  FACE: Facies symmetric, no obvious lesions.  EYES:  PERRL and Sclera non-icteric  EXTERNAL EARS:  Normal pinnae shape and position and No signs of inflammation  EXTERNAL AUDITORY CANAL:  LEFT - Patent, no evidence of inflammation  TYMPANIC MEMBRANE:  LEFT - Intact, healthy appearing and no evidence of middle ear effusion  EXTERNAL AUDITORY CANAL:  RIGHT - Patent, no evidence of inflammation  TYMPANIC MEMBRANE:  RIGHT - Intact, healthy appearing and no evidence of middle ear effusion  NOSE:  Externally the nose is straight and Internally the mucosa is healthy, no pus, polyps or bleeding spots noted  ORAL CAVITY:  Healthy appearing lips, tongue and gums and No visible masses or lesions  OROPHARYNX:  Clear, no masses seen  HYPOPHARYNX:  Deferred  NECK:  Trachea is midline and No masses are palpable  THYROID:  no significant thyroid abnormality by palpation  LYMPH:  No cervical lymphadenopathy is palpable  NEURO:  Tremors - absent   SKIN:  Skin is warm and dry to touch.  RESPIRATORY:  No stridor.  CARDIOVASCULAR:  No peripheral cyanosis is noted.  MUSCULOSKELETAL:  Extremities move equally well.  PSYCHIATRIC:  Patient is pleasant, cooperative and alert.     Procedure:     None    Data Reviewed:       Assessment and Plan:     Danny [name] was seen today for allergic rhinitis.    Diagnoses and all orders for this visit:    Chronic allergic rhinitis due to other allergic trigger, unspecified seasonality    History of immunotherapy       At this time we will proceed with the following: The patient is currently doing well with the prescribed treatment.  We will continue treatment as is at this time.    Plan for a return to clinic for evaluation in 1 year, sooner should there be problems.     I am scribing for, and in the  presence of, K. Madelaine Bhat, MD for services provided on 09/16/2017.    Octavio Graves, LPN  1/93/7902, 40:97      I have reviewed and confirmed the ROS, PFSH, and all other elements documented by the SCRIBE. The scribed portion of the progress note was scribed on my behalf and at my direction. I have reviewed and attest to the accuracy of the note.    Benita Stabile, MD 09/16/2017, 13:58

## 2017-10-04 ENCOUNTER — Other Ambulatory Visit (INDEPENDENT_AMBULATORY_CARE_PROVIDER_SITE_OTHER): Payer: Self-pay | Admitting: Family Medicine

## 2017-10-06 ENCOUNTER — Other Ambulatory Visit (INDEPENDENT_AMBULATORY_CARE_PROVIDER_SITE_OTHER): Payer: Self-pay | Admitting: Family Medicine

## 2017-10-06 NOTE — Telephone Encounter (Signed)
Dr Mel Almond , it looks like you already sent these in on 10/04/2017 Lincoln Brigham, LPN  45/80/[id number], 38:25

## 2017-10-08 ENCOUNTER — Ambulatory Visit (INDEPENDENT_AMBULATORY_CARE_PROVIDER_SITE_OTHER): Payer: BC Managed Care – PPO | Admitting: Family Medicine

## 2017-10-08 ENCOUNTER — Encounter (INDEPENDENT_AMBULATORY_CARE_PROVIDER_SITE_OTHER): Payer: Self-pay | Admitting: Family Medicine

## 2017-10-08 VITALS — BP 120/76 | HR 75 | Resp 18 | Ht 72.0 in | Wt 247.8 lb

## 2017-10-08 DIAGNOSIS — K219 Gastro-esophageal reflux disease without esophagitis: Secondary | ICD-10-CM

## 2017-10-08 DIAGNOSIS — F32A Depression, unspecified: Secondary | ICD-10-CM

## 2017-10-08 DIAGNOSIS — E782 Mixed hyperlipidemia: Secondary | ICD-10-CM

## 2017-10-08 DIAGNOSIS — F329 Major depressive disorder, single episode, unspecified: Secondary | ICD-10-CM

## 2017-10-08 DIAGNOSIS — Z6833 Body mass index (BMI) 33.0-33.9, adult: Secondary | ICD-10-CM

## 2017-10-08 DIAGNOSIS — I10 Essential (primary) hypertension: Secondary | ICD-10-CM

## 2017-10-08 MED ORDER — VARICELLA-ZOSTER GLYCOE VACC-AS01B ADJ(PF) 50 MCG/0.5 ML IM SUSP, KIT
0.5000 mL | INHALATION_SUSPENSION | Freq: Once | INTRAMUSCULAR | 0 refills | Status: AC
Start: 2017-10-08 — End: 2017-10-08

## 2017-10-08 NOTE — Progress Notes (Signed)
[facility]  [address] [place] [address]  Dept Phone: [phone]  Dept Fax: [phone]    Danny [name]  1961/07/21  [id number]    Date of Service: 10/08/2017   Chief complaint:   Chief Complaint   Patient presents with   . Follow Up 6 Months     No Compliants - Non Fasting       Subjective:   Patient presents today for routine 6 month followup to discuss his chronic medical problems which include his hypertension, hyperlipidemia, and depression.  Patient states he has been doing pretty well recently.  His depression is certainly better managed.  He continues to follow with Psychiatry for this.  As far as his blood pressure he states he does check it occasionally and it is also doing well.  He denies any side effects from his medication.  We did go over his cholesterol let levels all in detail and I discussed our goals.  I certainly feel that diet exercise her important but we will recheck his lab work and see where we are.    Current Outpatient Prescriptions   Medication Sig   . amLODIPine (NORVASC) 5 mg Oral Tablet take 1 tablet by mouth once daily   . ARIPiprazole (ABILIFY) 5 mg Oral Tablet Take 5 mg by mouth Once a day   . aspirin 81 mg Oral Tablet, Chewable Take 81 mg by mouth Once a day   . atorvastatin (LIPITOR) 40 mg Oral Tablet Take 1 Tab (40 mg total) by mouth Every night   . cholecalciferol, vitamin D3, 1,000 unit Oral Tablet Take 1,000 Units by mouth Once a day   . Colesevelam [facility]) 3.75 gram Oral Powder in Packet Take 3.75 g by mouth Once a day   . cyanocobalamin (VITAMIN B 12) 1,000 mcg Oral Tablet Take 1,000 mcg by mouth Once a day   . diphenhydrAMINE (BENADRYL) 25 mg Oral Capsule Take 25 mg by mouth Every 6 hours as needed   . EPINEPHrine (EPIPEN) 0.3 mg/0.3 mL Injection Auto-Injector 0.3 mL (0.3 mg total) by Intramuscular route Once, as needed for up to 1 dose   . Fluoxetine (PROZAC) 40 mg Oral Capsule Take 40 mg by mouth Once a day HVL   . fluticasone  (FLONASE) 50 mcg/actuation Nasal Spray, Suspension 1 Spray by Each Nostril route Twice daily   . lansoprazole (PREVACID SOLUTAB) 15 mg Oral Tablet,Rapid Dissolve, DR Take 15 mg by mouth Once a day   . loratadine (CLARITIN) 10 mg Oral Tablet Take 10 mg by mouth Once a day   . metoprolol succinate (TOPROL-XL) 25 mg Oral Tablet Sustained Release 24 hr take 1 tablet by mouth once daily   . naphazoline-pheniramine (OPCON-A) 0.02675-0.315 % Ophthalmic Drops Instill 1 Drop into both eyes Once per day as needed   . olmesartan (BENICAR) 40 mg Oral Tablet Take 1 Tab (40 mg total) by mouth Once a day   . PROAIR HFA 90 mcg/actuation Inhalation HFA Aerosol Inhaler Take 1-2 Puffs by inhalation Every 6 hours as needed   . Syringe with Needle, Disp, (BD ALLERGY SYRINGE) 1 mL 28 gauge x 1/2" Syringe Use 2 syringes weekly for allergy injections   . UNKNOWN MEDICATION (UNKNOWN MEDICATION) Allergy Injections every 7-10 days   . varicella-zoster, PF, (SHINGRIX, PF,) 50 mcg/0.5 mL Intramuscular Suspension for Reconstitution 0.5 mL by Intramuscular route One time for 1 dose       Objective:   BP 120/76  Pulse 75  Resp 18  Ht 1.829 m (6')  Wt 112.4 kg (247 lb 12.8 oz)  SpO2 97%  BMI 33.61 kg/m2  General appearance: alert, oriented x 3, in his normal state, cooperative, not in apparent distress, appearing stated age   42:  Eyes:  Pupils equal round react like accommodation extraocular muscles intact. Ears within normal limits throat clear, tonsils normal, no cervical lymphadenopathy thyroid normal  Lungs: clear to auscultation bilaterally, respirations non-labored  Heart: regular rate and rhythm, S1, S2 normal, no murmur, PMI non-diffuse  Abdomen: soft, non-tender. Bowel sounds normal.  Extremities: extremities normal, atraumatic, no cyanosis or edema, pulses intact in upper and lower extremities    Assessment     ENCOUNTER DIAGNOSES     ICD-10-CM   1. Depression F32.9   2. BMI 33.0-33.9,adult Z68.33   3. Gastroesophageal reflux  disease, esophagitis presence not specified K21.9   4. Mixed hyperlipidemia E78.2   5. Essential hypertension I10       Plan     Orders Placed This Encounter   . CBC   . COMPREHENSIVE METABOLIC PNL, FASTING   . LIPID PANEL   . VITAMIN D   . VITAMIN B12   . varicella-zoster, PF, (SHINGRIX, PF,) 50 mcg/0.5 mL Intramuscular Suspension for Reconstitution               Patient is to continue on all current medications other than those changed in the orders.     1.  Hypertension:  Currently well managed  2.  Depression:  Defer to Psychiatry but patient appears to be doing better  3.  Hyperlipidemia:  Will check his labs and see where we are.  Certainly will not be surprised if we need to increase his dosage of medication to try to get his LDL cholesterol to a reasonable level.    Nelda Severe Mel Almond, M.D.

## 2017-10-19 ENCOUNTER — Other Ambulatory Visit (HOSPITAL_BASED_OUTPATIENT_CLINIC_OR_DEPARTMENT_OTHER): Payer: Self-pay | Admitting: Otolaryngology

## 2017-11-04 LAB — POTASSIUM: POTASSIUM: 4.3

## 2017-11-04 LAB — CREATININE WITH EGFR: CREATININE: 1.02

## 2017-11-04 LAB — CREATININE: ESTIMATED GLOMERULAR FILTRATION RATE: 82

## 2017-11-08 ENCOUNTER — Encounter (INDEPENDENT_AMBULATORY_CARE_PROVIDER_SITE_OTHER): Payer: Self-pay | Admitting: Family Medicine

## 2017-11-08 DIAGNOSIS — E782 Mixed hyperlipidemia: Secondary | ICD-10-CM

## 2017-11-08 DIAGNOSIS — F32A Depression, unspecified: Secondary | ICD-10-CM

## 2017-11-08 DIAGNOSIS — F329 Major depressive disorder, single episode, unspecified: Secondary | ICD-10-CM

## 2017-11-09 NOTE — Progress Notes (Signed)
The patient is agreeable to go on Lipitor 80 mg. It can be called into [facility] in [place]. [place], [place]  11/09/2017, 10:25

## 2017-11-10 ENCOUNTER — Other Ambulatory Visit (INDEPENDENT_AMBULATORY_CARE_PROVIDER_SITE_OTHER): Payer: Self-pay | Admitting: Family Medicine

## 2017-11-10 MED ORDER — ATORVASTATIN 80 MG TABLET
80.0000 mg | ORAL_TABLET | Freq: Every evening | ORAL | 3 refills | Status: DC
Start: 2017-11-10 — End: 2018-06-27

## 2017-11-16 ENCOUNTER — Telehealth (HOSPITAL_BASED_OUTPATIENT_CLINIC_OR_DEPARTMENT_OTHER): Payer: Self-pay | Admitting: Internal Medicine

## 2017-11-16 NOTE — Telephone Encounter (Signed)
Pt's procedure is cancelled at this time.    Spoke to pt:  patient spoke to surgery    Procedure:  Colonoscopy  Original date/time:  11/17/18 @ 11:00  Provider:  Dr. Stacie Glaze  Reason:  Patient forgot the date and is out of town working. Will call back in a couple of weeks to reschedule    Sx removed from original date/time:  yes  Sx sched aware: yes  Packet moved to cancelled folder:  Yes    Follow up appointment cancelled:  Yes    Comments:

## 2017-11-16 NOTE — Progress Notes (Signed)
Patient is agreeable to go on lipitor 80 mg. It goes to [facility] in [place]. [place], [place]  11/16/2017, 09:23

## 2017-11-17 ENCOUNTER — Ambulatory Visit (HOSPITAL_COMMUNITY)
Admission: RE | Admit: 2017-11-17 | Payer: BC Managed Care – PPO | Source: Ambulatory Visit | Admitting: Internal Medicine

## 2017-12-01 ENCOUNTER — Telehealth (HOSPITAL_BASED_OUTPATIENT_CLINIC_OR_DEPARTMENT_OTHER): Payer: Self-pay | Admitting: Internal Medicine

## 2017-12-01 NOTE — Progress Notes (Signed)
Pt's procedure is cancelled at this time.    Spoke to pt:  patient spoke to surgery    Procedure:  EGD  Original date/time:  12/02/17  Provider:  Dr. Stacie Glaze  Reason:  Patient had a death in the family and is out of town. Will call back at a later date to reschedule.    Sx removed from original date/time:  yes  Sx sched aware:  Yes  Packet moved to cancelled folder:  Yes    Follow up appointment cancelled:  Yes    Comments:

## 2018-01-03 ENCOUNTER — Encounter (HOSPITAL_BASED_OUTPATIENT_CLINIC_OR_DEPARTMENT_OTHER): Payer: Self-pay | Admitting: Internal Medicine

## 2018-03-03 ENCOUNTER — Ambulatory Visit (INDEPENDENT_AMBULATORY_CARE_PROVIDER_SITE_OTHER): Payer: Self-pay | Admitting: Family Medicine

## 2018-03-03 NOTE — Telephone Encounter (Signed)
Pt is on Benicar and it is no longer available , can we switch him to Losartan ? Please send to [facility] .  Danny Brigham, LPN  4/74/2595, 63:87

## 2018-03-04 ENCOUNTER — Other Ambulatory Visit (INDEPENDENT_AMBULATORY_CARE_PROVIDER_SITE_OTHER): Payer: Self-pay | Admitting: Family Medicine

## 2018-03-04 MED ORDER — LOSARTAN 50 MG TABLET
50.0000 mg | ORAL_TABLET | Freq: Every day | ORAL | 3 refills | Status: DC
Start: 2018-03-04 — End: 2019-04-05

## 2018-04-11 ENCOUNTER — Encounter (INDEPENDENT_AMBULATORY_CARE_PROVIDER_SITE_OTHER): Payer: Self-pay | Admitting: Family Medicine

## 2018-06-01 ENCOUNTER — Other Ambulatory Visit (INDEPENDENT_AMBULATORY_CARE_PROVIDER_SITE_OTHER): Payer: Self-pay | Admitting: Family Medicine

## 2018-06-01 MED ORDER — AMLODIPINE 5 MG TABLET
5.0000 mg | ORAL_TABLET | Freq: Every day | ORAL | 1 refills | Status: DC
Start: 2018-06-01 — End: 2018-12-09

## 2018-06-01 MED ORDER — METOPROLOL SUCCINATE ER 25 MG TABLET,EXTENDED RELEASE 24 HR
25.0000 mg | ORAL_TABLET | Freq: Every day | ORAL | 1 refills | Status: DC
Start: 2018-06-01 — End: 2018-12-09

## 2018-06-27 ENCOUNTER — Other Ambulatory Visit (INDEPENDENT_AMBULATORY_CARE_PROVIDER_SITE_OTHER): Payer: Self-pay | Admitting: Family Medicine

## 2018-06-27 MED ORDER — FLUOXETINE 40 MG CAPSULE
40.0000 mg | ORAL_CAPSULE | Freq: Every day | ORAL | 5 refills | Status: DC
Start: 2018-06-27 — End: 2018-06-29

## 2018-06-27 MED ORDER — ATORVASTATIN 80 MG TABLET
80.00 mg | ORAL_TABLET | Freq: Every evening | ORAL | 3 refills | Status: DC
Start: 2018-06-27 — End: 2019-07-17

## 2018-06-29 ENCOUNTER — Other Ambulatory Visit: Payer: BC Managed Care – PPO | Attending: Family Medicine | Admitting: Family Medicine

## 2018-06-29 ENCOUNTER — Encounter (INDEPENDENT_AMBULATORY_CARE_PROVIDER_SITE_OTHER): Payer: Self-pay | Admitting: Family Medicine

## 2018-06-29 ENCOUNTER — Ambulatory Visit (INDEPENDENT_AMBULATORY_CARE_PROVIDER_SITE_OTHER): Payer: BC Managed Care – PPO | Admitting: Family Medicine

## 2018-06-29 ENCOUNTER — Other Ambulatory Visit (INDEPENDENT_AMBULATORY_CARE_PROVIDER_SITE_OTHER): Payer: Self-pay | Admitting: Family Medicine

## 2018-06-29 VITALS — BP 124/72 | HR 85 | Resp 18 | Ht 72.0 in | Wt 238.0 lb

## 2018-06-29 DIAGNOSIS — F329 Major depressive disorder, single episode, unspecified: Secondary | ICD-10-CM

## 2018-06-29 DIAGNOSIS — Z6832 Body mass index (BMI) 32.0-32.9, adult: Secondary | ICD-10-CM

## 2018-06-29 DIAGNOSIS — R7301 Impaired fasting glucose: Secondary | ICD-10-CM | POA: Insufficient documentation

## 2018-06-29 DIAGNOSIS — E782 Mixed hyperlipidemia: Secondary | ICD-10-CM

## 2018-06-29 DIAGNOSIS — Z125 Encounter for screening for malignant neoplasm of prostate: Secondary | ICD-10-CM | POA: Insufficient documentation

## 2018-06-29 DIAGNOSIS — Z1211 Encounter for screening for malignant neoplasm of colon: Secondary | ICD-10-CM

## 2018-06-29 DIAGNOSIS — I10 Essential (primary) hypertension: Secondary | ICD-10-CM

## 2018-06-29 DIAGNOSIS — F32A Depression, unspecified: Secondary | ICD-10-CM

## 2018-06-29 LAB — COMPREHENSIVE METABOLIC PNL, FASTING
ALBUMIN: 4.5 g/dL (ref 3.2–4.6)
ALT (SGPT): 96 U/L — ABNORMAL HIGH (ref <=52)
ANION GAP: 13 mmol/L
BUN/CREA RATIO: 7
BUN: 6 mg/dL — ABNORMAL LOW (ref 10–25)
BUN: 6 mg/dL — ABNORMAL LOW (ref 10–25)
CALCIUM: 9.4 mg/dL (ref 8.8–10.3)
CO2 TOTAL: 23 mmol/L (ref 21–35)
CREATININE: 0.91 mg/dL (ref <=1.30)
GLUCOSE: 119 mg/dL — ABNORMAL HIGH (ref 70–110)
POTASSIUM: 3.9 mmol/L (ref 3.5–5.0)
PROTEIN TOTAL: 7.4 g/dL (ref 6.0–8.3)
PROTEIN TOTAL: 7.4 g/dL (ref 6.0–8.3)
SODIUM: 136 mmol/L (ref 135–145)

## 2018-06-29 LAB — LIPID PANEL
CHOLESTEROL: 243 mg/dL — ABNORMAL HIGH (ref 0–199)
HDL CHOL: 48 mg/dL (ref >40)
LDL DIRECT: 173 mg/dL — ABNORMAL HIGH (ref 0–99)
TRIGLYCERIDES: 169 mg/dL (ref 0–199)
VLDL CALC: 34 mg/dL (ref 0–50)

## 2018-06-29 LAB — CBC
HCT: 47.1 % (ref 38.9–50.5)
HGB: 15.9 g/dL (ref 13.4–17.3)
MCH: 32.1 pg (ref 27.9–33.1)
MCHC: 33.8 g/dL (ref 32.8–36.0)
MCV: 94.8 fL (ref 82.4–95.0)
MPV: 8.1 fL (ref 6.0–10.2)
PLATELETS: 229 x10ˆ3/uL (ref 140–440)
RBC: 4.97 10*6/uL (ref 4.40–5.68)
RDW: 13.6 % (ref 10.9–15.1)
WBC: 4.8 x10ˆ3/uL (ref 3.3–9.3)

## 2018-06-29 LAB — PSA SCREENING: PSA: 0.2 ng/mL (ref 0.0–4.0)

## 2018-06-29 LAB — HGA1C (HEMOGLOBIN A1C WITH EST AVG GLUCOSE)
ESTIMATED AVERAGE GLUCOSE: 151 mg/dL
HEMOGLOBIN A1C: 6.9 % — ABNORMAL HIGH (ref 4.1–5.7)

## 2018-06-29 MED ORDER — FLUOXETINE 40 MG CAPSULE
40.0000 mg | ORAL_CAPSULE | Freq: Every day | ORAL | 3 refills | Status: DC
Start: 2018-06-29 — End: 2018-12-30

## 2018-06-29 MED ORDER — LANCETS
1.00 | Freq: Two times a day (BID) | 3 refills | Status: DC
Start: 2018-06-29 — End: 2018-06-30

## 2018-06-29 MED ORDER — METFORMIN 500 MG TABLET
500.00 mg | ORAL_TABLET | Freq: Every morning | ORAL | 4 refills | Status: DC
Start: 2018-06-29 — End: 2019-01-10

## 2018-06-29 MED ORDER — BLOOD-GLUCOSE METER
1.0000 | Freq: Once | 0 refills | Status: DC
Start: 2018-06-29 — End: 2018-06-30

## 2018-06-29 MED ORDER — BLOOD SUGAR DIAGNOSTIC STRIPS
1.00 | ORAL_STRIP | Freq: Two times a day (BID) | 3 refills | Status: DC
Start: 2018-06-29 — End: 2018-06-30

## 2018-06-29 NOTE — Addendum Note (Signed)
Addended by: Beverlee Nims D on: 06/29/2018 12:53 PM     Modules accepted: Orders

## 2018-06-29 NOTE — Progress Notes (Signed)
Danny [name]  [address] [place] [address]  Dept Phone: [phone]  Dept Fax: [phone]    Danny [name]  [birth year]  [id number]    Date of Service: 06/29/2018   Chief complaint:   Chief Complaint   Patient presents with   . Follow Up 6 Months     HTN , Hyperlipidemia , depression    . Blood Work     pt is fasting        Subjective:   Patient presents today for routine 6 month followup to discuss his chronic medical problems which include his hypertension, hyperlipidemia, and depression.  Patient states he is doing well and does not really have any issues or concerns.  His depression is currently well managed.  As far as his blood pressure he does check it occasionally at home and states it is doing very well.  He denies any chest pain or side effects from his cholesterol medication.  No issues or concerns today.  We did discuss his colonoscopy.  Apparently at his last colonoscopy was not well prepped so it is time for him to get another screening colonoscopy.  Will schedule this for the patient.    Current Outpatient Medications   Medication Sig   . amLODIPine (NORVASC) 5 mg Oral Tablet Take 1 Tab (5 mg total) by mouth Once a day   . ARIPiprazole (ABILIFY) 5 mg Oral Tablet Take 5 mg by mouth Once a day   . aspirin 81 mg Oral Tablet, Chewable Take 81 mg by mouth Once a day   . atorvastatin (LIPITOR) 80 mg Oral Tablet Take 1 Tab (80 mg total) by mouth Every night   . BD ALLERGY SYRINGE 1 mL 28 gauge x 1/2" Syringe use 2 SYRINGES every week for ALLERGY INJECTIONS   . cholecalciferol, vitamin D3, 1,000 unit Oral Tablet Take 1,000 Units by mouth Once a day   . Colesevelam [facility]) 3.75 gram Oral Powder in Packet Take 3.75 g by mouth Once a day   . cyanocobalamin (VITAMIN B 12) 1,000 mcg Oral Tablet Take 1,000 mcg by mouth Once a day   . diphenhydrAMINE (BENADRYL) 25 mg Oral Capsule Take 25 mg by mouth Every 6 hours as needed   . EPINEPHrine (EPIPEN) 0.3 mg/0.3 mL Injection  Auto-Injector 0.3 mL (0.3 mg total) by Intramuscular route Once, as needed for up to 1 dose   . FLUoxetine (PROZAC) 40 mg Oral Capsule Take 1 Cap (40 mg total) by mouth Once a day HVL   . fluticasone (FLONASE) 50 mcg/actuation Nasal Spray, Suspension 1 Spray by Each Nostril route Twice daily   . lansoprazole (PREVACID SOLUTAB) 15 mg Oral Tablet,Rapid Dissolve, DR Take 15 mg by mouth Once a day   . loratadine (CLARITIN) 10 mg Oral Tablet Take 10 mg by mouth Once a day   . losartan (COZAAR) 50 mg Oral Tablet Take 1 Tab (50 mg total) by mouth Once a day   . metoprolol succinate (TOPROL-XL) 25 mg Oral Tablet Sustained Release 24 hr Take 1 Tab (25 mg total) by mouth Once a day   . naphazoline-pheniramine (OPCON-A) 0.02675-0.315 % Ophthalmic Drops Instill 1 Drop into both eyes Once per day as needed   . olmesartan (BENICAR) 40 mg Oral Tablet Take 1 Tab (40 mg total) by mouth Once a day   . PROAIR HFA 90 mcg/actuation Inhalation HFA Aerosol Inhaler Take 1-2 Puffs by inhalation Every 6 hours as needed  Objective:   BP 124/72   Pulse 85   Resp 18   Ht 1.829 m (6')   Wt 108 kg (238 lb)   SpO2 98%   BMI 32.28 kg/m       General appearance: alert, oriented x 3, in his normal state, cooperative, not in apparent distress, appearing stated age   HEENT:  Eyes:  Pupils equal round react like accommodation extraocular muscles intact. Ears within normal limits throat clear, tonsils normal, no cervical lymphadenopathy thyroid normal  Lungs: clear to auscultation bilaterally, respirations non-labored  Heart: regular rate and rhythm, S1, S2 normal, no murmur, PMI non-diffuse  Abdomen: soft, non-tender. Bowel sounds normal.  Extremities: extremities normal, atraumatic, no cyanosis or edema, pulses intact in upper and lower extremities    Assessment     ENCOUNTER DIAGNOSES     ICD-10-CM   1. Mixed hyperlipidemia E78.2   2. Essential hypertension I10   3. Depression, unspecified depression type F32.9   4. Screening for colon  cancer Z12.11   5. Elevated fasting blood sugar R73.01   6. Screening for prostate cancer Z12.5       Plan     Orders Placed This Encounter   . CBC   . COMPREHENSIVE METABOLIC PNL, FASTING   . LIPID PANEL   . HGA1C (HEMOGLOBIN A1C WITH EST AVG GLUCOSE)   . PSA SCREENING   . Refer to [facility]   . FLUoxetine (PROZAC) 40 mg Oral Capsule             Patient is to continue on all current medications other than those changed in the orders.     1. Hypertension:  Currently well managed  2. Hyperlipidemia:  Will check labs and call patient with results  3. Depression:  Currently well managed.    Nelda Severe Mel Almond, M.D.

## 2018-06-30 ENCOUNTER — Other Ambulatory Visit (INDEPENDENT_AMBULATORY_CARE_PROVIDER_SITE_OTHER): Payer: Self-pay | Admitting: Family Medicine

## 2018-06-30 DIAGNOSIS — R7301 Impaired fasting glucose: Secondary | ICD-10-CM

## 2018-06-30 MED ORDER — BLOOD-GLUCOSE METER
1.0000 | Freq: Once | 0 refills | Status: AC
Start: 2018-06-30 — End: 2018-06-30

## 2018-06-30 MED ORDER — BLOOD SUGAR DIAGNOSTIC STRIPS
1.00 | ORAL_STRIP | Freq: Two times a day (BID) | 3 refills | Status: AC
Start: 2018-06-30 — End: ?

## 2018-06-30 MED ORDER — LANCETS
1.00 | Freq: Two times a day (BID) | 3 refills | Status: DC
Start: 2018-06-30 — End: 2021-07-07

## 2018-08-24 ENCOUNTER — Ambulatory Visit (INDEPENDENT_AMBULATORY_CARE_PROVIDER_SITE_OTHER): Payer: BC Managed Care – PPO | Admitting: Family Medicine

## 2018-08-24 ENCOUNTER — Encounter (INDEPENDENT_AMBULATORY_CARE_PROVIDER_SITE_OTHER): Payer: Self-pay | Admitting: Family Medicine

## 2018-08-24 ENCOUNTER — Other Ambulatory Visit: Payer: BC Managed Care – PPO | Attending: Family Medicine | Admitting: Family Medicine

## 2018-08-24 VITALS — BP 126/82 | HR 93 | Resp 18 | Ht 72.0 in | Wt 237.0 lb

## 2018-08-24 DIAGNOSIS — K219 Gastro-esophageal reflux disease without esophagitis: Secondary | ICD-10-CM | POA: Insufficient documentation

## 2018-08-24 DIAGNOSIS — F419 Anxiety disorder, unspecified: Secondary | ICD-10-CM

## 2018-08-24 DIAGNOSIS — Z6832 Body mass index (BMI) 32.0-32.9, adult: Secondary | ICD-10-CM

## 2018-08-24 LAB — CBC
HCT: 46.9 % (ref 38.9–50.5)
HGB: 15.9 g/dL (ref 13.4–17.3)
MCH: 31.7 pg (ref 27.9–33.1)
MCHC: 33.8 g/dL (ref 32.8–36.0)
MCV: 93.7 fL (ref 82.4–95.0)
MPV: 8.1 fL (ref 6.0–10.2)
PLATELETS: 237 10*3/uL (ref 140–440)
RBC: 5.01 x10ˆ6/uL (ref 4.40–5.68)
RDW: 14.1 % (ref 10.9–15.1)
WBC: 7.5 x10ˆ3/uL (ref 3.3–9.3)

## 2018-08-24 MED ORDER — ONDANSETRON HCL 4 MG TABLET
4.0000 mg | ORAL_TABLET | Freq: Three times a day (TID) | ORAL | 0 refills | Status: DC | PRN
Start: 2018-08-24 — End: 2022-08-19

## 2018-08-24 MED ORDER — LANSOPRAZOLE 30 MG CAPSULE,DELAYED RELEASE
30.0000 mg | DELAYED_RELEASE_CAPSULE | Freq: Every day | ORAL | 5 refills | Status: DC
Start: 2018-08-24 — End: 2019-03-08

## 2018-08-24 MED ORDER — DIAZEPAM 5 MG TABLET
5.00 mg | ORAL_TABLET | Freq: Two times a day (BID) | ORAL | 1 refills | Status: DC | PRN
Start: 2018-08-24 — End: 2019-08-14

## 2018-08-24 NOTE — Progress Notes (Signed)
[facility]  [address] [place] [address]  Dept Phone: [phone]  Dept Fax: [phone]    Danny [name]  July 22, 1961  [id number]    Date of Service: 08/24/2018   Chief complaint:   Chief Complaint   Patient presents with   . Abdominal Pain     x 10 days   . Vomiting     x 10 days   . Stool Color Change     pt reports stool is black x 2 days       Subjective:   Patient presents today for an acute visit due to abdominal discomfort nausea and loose stool.  Patient states he noticed some black discoloration to his stool.  He has been taking Pepto-Bismol over-the-counter.    Current Outpatient Medications   Medication Sig   . amLODIPine (NORVASC) 5 mg Oral Tablet Take 1 Tab (5 mg total) by mouth Once a day   . ARIPiprazole (ABILIFY) 5 mg Oral Tablet Take 5 mg by mouth Once a day   . aspirin 81 mg Oral Tablet, Chewable Take 81 mg by mouth Once a day   . atorvastatin (LIPITOR) 80 mg Oral Tablet Take 1 Tab (80 mg total) by mouth Every night   . Blood Sugar Diagnostic (ONETOUCH VERIO) Strip 1 Strip by Does not apply route Twice daily   . cholecalciferol, vitamin D3, 1,000 unit Oral Tablet Take 1,000 Units by mouth Once a day   . Colesevelam [facility]) 3.75 gram Oral Powder in Packet Take 3.75 g by mouth Once a day   . cyanocobalamin (VITAMIN B 12) 1,000 mcg Oral Tablet Take 1,000 mcg by mouth Once a day   . diazePAM (VALIUM) 5 mg Oral Tablet Take 1 Tab (5 mg total) by mouth Every 12 hours as needed for Anxiety   . diphenhydrAMINE (BENADRYL) 25 mg Oral Capsule Take 25 mg by mouth Every 6 hours as needed   . EPINEPHrine (EPIPEN) 0.3 mg/0.3 mL Injection Auto-Injector 0.3 mL (0.3 mg total) by Intramuscular route Once, as needed for up to 1 dose   . FLUoxetine (PROZAC) 40 mg Oral Capsule Take 1 Cap (40 mg total) by mouth Once a day HVL   . fluticasone (FLONASE) 50 mcg/actuation Nasal Spray, Suspension 1 Spray by Each Nostril route Twice daily   . Lancets Misc 1 Stick by Does not apply route  Twice daily   . lansoprazole (PREVACID SOLUTAB) 15 mg Oral Tablet,Rapid Dissolve, DR Take 15 mg by mouth Once a day   . lansoprazole (PREVACID) 30 mg Oral Capsule, Delayed Release(E.C.) Take 1 Cap (30 mg total) by mouth Once a day   . loratadine (CLARITIN) 10 mg Oral Tablet Take 10 mg by mouth Once a day   . losartan (COZAAR) 50 mg Oral Tablet Take 1 Tab (50 mg total) by mouth Once a day   . metFORMIN (GLUCOPHAGE) 500 mg Oral Tablet Take 1 Tab (500 mg total) by mouth Every morning with breakfast (Patient not taking: Reported on 08/24/2018)   . metoprolol succinate (TOPROL-XL) 25 mg Oral Tablet Sustained Release 24 hr Take 1 Tab (25 mg total) by mouth Once a day   . naphazoline-pheniramine (OPCON-A) 0.02675-0.315 % Ophthalmic Drops Instill 1 Drop into both eyes Once per day as needed   . olmesartan (BENICAR) 40 mg Oral Tablet Take 1 Tab (40 mg total) by mouth Once a day   . ondansetron (ZOFRAN) 4 mg Oral Tablet Take 1 Tab (4 mg total)  by mouth Every 8 hours as needed for nausea/vomiting   . PROAIR HFA 90 mcg/actuation Inhalation HFA Aerosol Inhaler Take 1-2 Puffs by inhalation Every 6 hours as needed       Objective:   BP 126/82   Pulse 93   Resp 18   Ht 1.829 m (6')   Wt 107.5 kg (237 lb)   SpO2 96%   BMI 32.14 kg/m       General appearance: alert, oriented x 3, in his normal state, cooperative, not in apparent distress, appearing stated age   HEENT:  Eyes:  Pupils equal round react like accommodation extraocular muscles intact. Ears within normal limits throat clear, tonsils normal, no cervical lymphadenopathy thyroid normal  Lungs: clear to auscultation bilaterally, respirations non-labored  Heart: regular rate and rhythm, S1, S2 normal, no murmur, PMI non-diffuse  Abdomen: soft, diffuse minimal tenderness without rebound or guarding, bowel sounds normal.  Extremities: extremities normal, atraumatic, no cyanosis or edema, pulses intact in upper and lower extremities    Assessment     ENCOUNTER DIAGNOSES      ICD-10-CM   1. Gastroesophageal reflux disease, esophagitis presence not specified K21.9   2. Anxiety F41.9       Plan     Orders Placed This Encounter   . CBC   . diazePAM (VALIUM) 5 mg Oral Tablet   . ondansetron (ZOFRAN) 4 mg Oral Tablet   . lansoprazole (PREVACID) 30 mg Oral Capsule, Delayed Release(E.C.)             Patient is to continue on all current medications other than those changed in the orders.     His abdominal be exam is fairly benign.  He does have a little bit subjective diffuse tenderness.  His stool was heme negative.  I am going to go ahead and do a CBC.  Given some med is occasion for nausea and GERD.  Will have patient call for problems.  If his abdominal pain worsens he should go to the Emergency Department for evaluation.  Patient also needed a prescription for Valium which he takes before he flies.    Nelda Severe Mel Almond, M.D.

## 2018-09-15 ENCOUNTER — Encounter (HOSPITAL_BASED_OUTPATIENT_CLINIC_OR_DEPARTMENT_OTHER): Payer: Self-pay | Admitting: Otolaryngology

## 2018-11-09 ENCOUNTER — Encounter (HOSPITAL_BASED_OUTPATIENT_CLINIC_OR_DEPARTMENT_OTHER): Payer: Self-pay | Admitting: Surgery

## 2018-11-09 ENCOUNTER — Ambulatory Visit: Payer: BC Managed Care – PPO | Attending: Family Medicine | Admitting: Surgery

## 2018-11-09 VITALS — BP 138/70 | HR 98 | Temp 97.4°F | Ht 72.0 in | Wt 244.4 lb

## 2018-11-09 DIAGNOSIS — K219 Gastro-esophageal reflux disease without esophagitis: Secondary | ICD-10-CM

## 2018-11-09 DIAGNOSIS — Z6833 Body mass index (BMI) 33.0-33.9, adult: Secondary | ICD-10-CM

## 2018-11-09 DIAGNOSIS — Z01818 Encounter for other preprocedural examination: Secondary | ICD-10-CM

## 2018-11-09 DIAGNOSIS — Z1211 Encounter for screening for malignant neoplasm of colon: Principal | ICD-10-CM

## 2018-11-09 DIAGNOSIS — K317 Polyp of stomach and duodenum: Secondary | ICD-10-CM

## 2018-11-09 MED ORDER — SODIUM,POTASSIUM,MAG SULFATES 17.5 GRAM-3.13 GRAM-1.6 GRAM ORAL SOLN
1.00 | Freq: Two times a day (BID) | ORAL | 0 refills | Status: DC
Start: 2018-11-09 — End: 2019-05-29

## 2018-11-09 NOTE — Patient Instructions (Signed)
Pre-operative Education for [facility] and Bowel Surgery  I reviewed the following with the patient/family:   Discussion of Diet, handout given to patient to follow prior to procedure.   Medication reviewed with patient, verbal instructions and handout was given to   patient indicating Medication to continue and Medication that must be stopped.   Bowel Prep Instructions reviewed and a paper handout was included in the patient  packet.   Date discussed with patient and included in  Packet   Pre-op testing and time frame to have test completed were discussed and written on order form in patient packet.

## 2018-11-09 NOTE — H&P (Signed)
[facility] History & Physical    Patient: Danny [name]  D.O.B.: 09-29-1961  MRN# [id number]  Date of Service: 11/09/2018  REFERRING PROVIDER: Wonda Horner.    HPI  Danny [name] is a 57 y.o. male who was seen today for Colonoscopy. He has a screening colonoscopy in 2015 at [facility]. He had poor prep with stool throughout the entire colon. Recommended a repeat colonoscopy in one year. He denies change in bowel habits, rectal bleeding, weight loss or abdominal pain. Negative family history of colon or rectal cancer.     He has an EGD in 2018 with another provider due to reflux. There was a hyperplastic polyp which was indefinite for dysplasia. Recommended a repeat EGD in three months and he has not had that done. He is currently taking Prevacid daily. Denies epigastric pain.     Past Medical History:   Diagnosis Date   . Elevated lipids    . GERD (gastroesophageal reflux disease)    . Hypertension    . Sleep apnea    . Vomiting           Past Surgical History:   Procedure Laterality Date   . COLONOSCOPY  2015    [facility]   . ESOPHAGOGASTRODUODENOSCOPY  2015    [place]  07/28/2017    Dr. Stacie Glaze    . HX ELBOW SURGERY Left 2011   . HX HAND SURGERY Left 06/2013    incision and drainage   . HX HAND SURGERY Left     incision and drainage   . HX WISDOM TEETH EXTRACTION            Current Outpatient Medications   Medication Sig   . amLODIPine (NORVASC) 5 mg Oral Tablet Take 1 Tab (5 mg total) by mouth Once a day   . ARIPiprazole (ABILIFY) 5 mg Oral Tablet Take 5 mg by mouth Once a day   . aspirin 81 mg Oral Tablet, Chewable Take 81 mg by mouth Once a day   . atorvastatin (LIPITOR) 80 mg Oral Tablet Take 1 Tab (80 mg total) by mouth Every night   . Blood Sugar Diagnostic (ONETOUCH VERIO) Strip 1 Strip by Does not apply route Twice daily   . cholecalciferol, vitamin D3, 1,000 unit Oral Tablet Take 1,000 Units by mouth Once a day   . Colesevelam [facility]) 3.75 gram  Oral Powder in Packet Take 3.75 g by mouth Once a day   . cyanocobalamin (VITAMIN B 12) 1,000 mcg Oral Tablet Take 1,000 mcg by mouth Once a day   . diazePAM (VALIUM) 5 mg Oral Tablet Take 1 Tab (5 mg total) by mouth Every 12 hours as needed for Anxiety   . diphenhydrAMINE (BENADRYL) 25 mg Oral Capsule Take 25 mg by mouth Every 6 hours as needed   . EPINEPHrine (EPIPEN) 0.3 mg/0.3 mL Injection Auto-Injector 0.3 mL (0.3 mg total) by Intramuscular route Once, as needed for up to 1 dose   . FLUoxetine (PROZAC) 40 mg Oral Capsule Take 1 Cap (40 mg total) by mouth Once a day HVL   . fluticasone (FLONASE) 50 mcg/actuation Nasal Spray, Suspension 1 Spray by Each Nostril route Twice daily   . Lancets Misc 1 Stick by Does not apply route Twice daily   . lansoprazole (PREVACID SOLUTAB) 15 mg Oral Tablet,Rapid Dissolve, DR Take 15 mg by mouth Once a day   . lansoprazole (PREVACID) 30 mg Oral Capsule,  Delayed Release(E.C.) Take 1 Cap (30 mg total) by mouth Once a day   . loratadine (CLARITIN) 10 mg Oral Tablet Take 10 mg by mouth Once a day   . losartan (COZAAR) 50 mg Oral Tablet Take 1 Tab (50 mg total) by mouth Once a day   . metFORMIN (GLUCOPHAGE) 500 mg Oral Tablet Take 1 Tab (500 mg total) by mouth Every morning with breakfast (Patient not taking: Reported on 08/24/2018)   . metoprolol succinate (TOPROL-XL) 25 mg Oral Tablet Sustained Release 24 hr Take 1 Tab (25 mg total) by mouth Once a day   . naphazoline-pheniramine (OPCON-A) 0.02675-0.315 % Ophthalmic Drops Instill 1 Drop into both eyes Once per day as needed   . olmesartan (BENICAR) 40 mg Oral Tablet Take 1 Tab (40 mg total) by mouth Once a day   . ondansetron (ZOFRAN) 4 mg Oral Tablet Take 1 Tab (4 mg total) by mouth Every 8 hours as needed for nausea/vomiting   . PROAIR HFA 90 mcg/actuation Inhalation HFA Aerosol Inhaler Take 1-2 Puffs by inhalation Every 6 hours as needed   . Sodium,Potassium,&Mag Sulfates (SUPREP BOWEL PREP KIT) 17.5-3.13-1.6 gram Oral Recon Soln  Take 1 Bottle by mouth Every 12 hours     Allergies   Allergen Reactions   . Percocet [Oxycodone] Anaphylaxis      Social History     Tobacco Use   . Smoking status: Never Smoker   . Smokeless tobacco: Never Used   Substance Use Topics   . Alcohol use: Yes     Alcohol/week: 30.0 standard drinks     Types: 30 Cans of beer per week     Social History     Substance and Sexual Activity   Drug Use No      Family Medical History:     Problem Relation (Age of Onset)    Alzheimer's/Dementia Father    Cancer Other, Mother, Sister    HTN <20 y.o. Other    Healthy Sister, Son, Son    Hypertension (High Blood Pressure) Father                ROS:  General: Denies fever, chills, or any changes in weight.  EENT: Denies blurry vision, hearing loss.  Cardiovascular: Denies chest pain or palpitations.  Respiratory: Denies SOB, cough.  Gastrointestinal: Denies abdominal pain, N/V, constipation, or diarrhea. Denies any change in bowel movements or rectal bleeding.  GU: Denies dysuria, change in urinary habits, or hematuria.  Musculoskeletal: Denies weakness or joint pain. Denies trouble moving extremities.  Neurological: Denies HA, dizziness.  Hematologic: Denies hx of bleeding disorders or blood clots.  Psychiatric: Denies changes in mood, anxiety, or depression.    Objective:  BP 138/70   Pulse 98   Temp 36.3 C (97.4 F) (Thermal Scan)   Ht 1.829 m (6')   Wt 110.9 kg (244 lb 6.4 oz)   SpO2 98%   BMI 33.15 kg/m         Physical Exam:  Constitutional:  Alert, healthy, well nourished, no acute distress and not in pain.  HEENT: Normal  Neck: Trachea midline, supple.  Pulmonary: Normal effort and rate observed.  Cardiovascular: Regular rate and rhythm.  Abdomen/GI: Abdomen soft and supple; No tenderness. Non-distended; No masses present. Bowel sounds normal and active in all 4 quadrants.  Extremities: No peripheral edema; No cyanosis or clubbing of nails.  Musculoskeletal: Normal muscle strength and tone of all four  extremities.  Skin: No rashes or lesions present;  Warm and dry. No jaundice.  Psychiatric: Normal mood and affect; Judgement and thought content normal.      Assessment:  ENCOUNTER DIAGNOSES     ICD-10-CM   1. Screening for colon cancer Z12.11   2. Gastric polyp K31.7   3. Preoperative testing Z01.818        Plan:   Orders Placed This Encounter   Procedures   . LOWER ENDOSCOPY ([facility])     Standing Status:   Future     Standing Expiration Date:   11/10/2019     Order Specific Question:   Type of process requested     Answer:   COLONOSCOPY     Order Specific Question:   Serv. Provider     Answer:   Rowe Clack     Order Specific Question:   Stop anticoagulation therapy     Answer:   Other     Order Specific Question:   Medical Clearance     Answer:   N/A     Order Specific Question:   Type of anesthesia     Answer:   OTHER   . UPPER ENDOSCOPY ([facility])     Standing Status:   Future     Standing Expiration Date:   11/10/2019     Order Specific Question:   Indication     Answer:   Screening     Order Specific Question:   Type of process requested     Answer:   EGD     Order Specific Question:   Serv. Provider     Answer:   Rowe Clack     Order Specific Question:   Stop anticoagulation therapy     Answer:   No     Order Specific Question:   Medical Clearance     Answer:   N/A     Order Specific Question:   Type of anesthesia     Answer:   OTHER   . CBC/DIFF     Standing Status:   Future     Standing Expiration Date:   01/10/2020   . COMPREHENSIVE METABOLIC PANEL, NON-FASTING     Standing Status:   Future     Standing Expiration Date:   01/10/2020     Medication Orders   Medications   . Sodium,Potassium,&Mag Sulfates (SUPREP BOWEL PREP KIT) 17.5-3.13-1.6 gram Oral Recon Soln     Sig: Take 1 Bottle by mouth Every 12 hours     Dispense:  1 Bottle     Refill:  0     Patient for EGD    #1 The patient understands the risks of an EGD to include bleeding, infection, tear of the esophagus, stomach or bowel, need for emergency  surgical intervention, missed or lack of findings etc.     Patient for colonoscopy    #1 I explained the risks of colonoscopy to the patient including bleeding, infection, missed polyps, inability to attain the cecum and need for barium enema, possibility of perforation and need for emergency surgery etc. The patient agrees to proceed.     #2 The patient will have preoperative bowel prep and laboratory studies.           Return if symptoms worsen or fail to improve.    He was given the opportunity to ask questions and those questions were appropriately answered. He agreed with the treatment plan and is encouraged to call with any additional questions or concerns.  Donaciano Eva, LPN   I am scribing for, and in the presence of Dr. Marguerita Beards for services provided on 11/09/2018.  Donaciano Eva, LPN     I personally performed the services described in this documentation, as scribed  in my presence, and it is both accurate  and complete.    Marguerita Beards, MD

## 2018-12-09 ENCOUNTER — Other Ambulatory Visit (INDEPENDENT_AMBULATORY_CARE_PROVIDER_SITE_OTHER): Payer: Self-pay | Admitting: Family Medicine

## 2018-12-30 ENCOUNTER — Other Ambulatory Visit (INDEPENDENT_AMBULATORY_CARE_PROVIDER_SITE_OTHER): Payer: Self-pay | Admitting: Family Medicine

## 2018-12-30 MED ORDER — FLUOXETINE 40 MG CAPSULE
40.0000 mg | ORAL_CAPSULE | Freq: Every day | ORAL | 3 refills | Status: DC
Start: 2018-12-30 — End: 2020-01-19

## 2018-12-30 NOTE — Telephone Encounter (Signed)
This prescription has been called into the pharmacy.[place], [place]  12/30/2018, 11:35

## 2019-01-03 ENCOUNTER — Encounter (INDEPENDENT_AMBULATORY_CARE_PROVIDER_SITE_OTHER): Payer: Self-pay | Admitting: Family Medicine

## 2019-01-10 ENCOUNTER — Ambulatory Visit (INDEPENDENT_AMBULATORY_CARE_PROVIDER_SITE_OTHER): Payer: BC Managed Care – PPO | Admitting: Family Medicine

## 2019-01-10 ENCOUNTER — Encounter (INDEPENDENT_AMBULATORY_CARE_PROVIDER_SITE_OTHER): Payer: Self-pay | Admitting: Family Medicine

## 2019-01-10 VITALS — BP 126/84 | HR 93 | Resp 18 | Ht 72.0 in | Wt 241.0 lb

## 2019-01-10 DIAGNOSIS — E119 Type 2 diabetes mellitus without complications: Secondary | ICD-10-CM

## 2019-01-10 DIAGNOSIS — Z6832 Body mass index (BMI) 32.0-32.9, adult: Secondary | ICD-10-CM

## 2019-01-10 DIAGNOSIS — I10 Essential (primary) hypertension: Principal | ICD-10-CM

## 2019-01-10 DIAGNOSIS — F32A Depression, unspecified: Secondary | ICD-10-CM

## 2019-01-10 DIAGNOSIS — E782 Mixed hyperlipidemia: Secondary | ICD-10-CM

## 2019-01-10 DIAGNOSIS — F329 Major depressive disorder, single episode, unspecified: Secondary | ICD-10-CM

## 2019-01-10 MED ORDER — CLOBETASOL 0.05 % TOPICAL CREAM: g | Freq: Two times a day (BID) | CUTANEOUS | 3 refills | 0 days | Status: DC

## 2019-01-10 MED ORDER — GLIPIZIDE 10 MG TABLET
10.0000 mg | ORAL_TABLET | Freq: Every day | ORAL | 4 refills | Status: DC
Start: 2019-01-10 — End: 2019-05-26

## 2019-01-10 MED ORDER — COLESEVELAM 625 MG TABLET: 1875 mg | Tab | Freq: Two times a day (BID) | ORAL | 3 refills | 0 days | Status: AC

## 2019-01-10 NOTE — Progress Notes (Signed)
[facility]  [address] [place] [address]  Dept Phone: [phone]  Dept Fax: [phone]    Danny [name]  08-Dec-1961  [id number]    Date of Service: 01/10/2019   Chief complaint:   Chief Complaint   Patient presents with   . Follow Up 6 Months     HTN, Hyperlipidemia , Depression , Pre- diabetic    . Blood Work     pt isnt fasting        Subjective:   Patient presents today for his routine followup to discuss his chronic medical problems which include his new onset of type 2 diabetes, hypertension, hyperlipidemia, and depression.  We spent the majority of the time discussing his diabetes.  The patient was still under the impression that he was a "prediabetic" however at this point I certainly consider him a diabetic.  His last hemoglobin A1c was 6.9.  I stressed the patient the importance of following a diabetic diet, checking his blood sugar, exercise and weight loss.  He was unable to tolerate the metformin due to GI side effects.  He is discontinue that and has not checked his blood sugar since his last visit here.  As far as his hyperlipidemia he stop taking his Welchol because he did not like the powder but is willing to take this and has tablet form.    Current Outpatient Medications   Medication Sig   . amLODIPine (NORVASC) 5 mg Oral Tablet TAKE 1 TAB (5 MG TOTAL) BY MOUTH ONCE A DAY   . ARIPiprazole (ABILIFY) 5 mg Oral Tablet Take 5 mg by mouth Once a day   . aspirin 81 mg Oral Tablet, Chewable Take 81 mg by mouth Once a day   . atorvastatin (LIPITOR) 80 mg Oral Tablet Take 1 Tab (80 mg total) by mouth Every night   . Blood Sugar Diagnostic (ONETOUCH VERIO) Strip 1 Strip by Does not apply route Twice daily   . cholecalciferol, vitamin D3, 1,000 unit Oral Tablet Take 1,000 Units by mouth Once a day   . colesevelam [facility]) 625 mg Oral Tablet Take 3 Tabs (1,875 mg total) by mouth Twice daily with food   . cyanocobalamin (VITAMIN B 12) 1,000 mcg Oral Tablet Take 1,000 mcg  by mouth Once a day   . diazePAM (VALIUM) 5 mg Oral Tablet Take 1 Tab (5 mg total) by mouth Every 12 hours as needed for Anxiety   . diphenhydrAMINE (BENADRYL) 25 mg Oral Capsule Take 25 mg by mouth Every 6 hours as needed   . EPINEPHrine (EPIPEN) 0.3 mg/0.3 mL Injection Auto-Injector 0.3 mL (0.3 mg total) by Intramuscular route Once, as needed for up to 1 dose   . FLUoxetine (PROZAC) 40 mg Oral Capsule Take 1 Cap (40 mg total) by mouth Once a day HVL   . fluticasone (FLONASE) 50 mcg/actuation Nasal Spray, Suspension 1 Spray by Each Nostril route Twice daily   . glipiZIDE (GLUCOTROL) 10 mg Oral Tablet Take 1 Tab (10 mg total) by mouth Once a day Take 30 minutes before meals   . Lancets Misc 1 Stick by Does not apply route Twice daily   . lansoprazole (PREVACID) 30 mg Oral Capsule, Delayed Release(E.C.) Take 1 Cap (30 mg total) by mouth Once a day   . loratadine (CLARITIN) 10 mg Oral Tablet Take 10 mg by mouth Once a day   . losartan (COZAAR) 50 mg Oral Tablet Take 1 Tab (50 mg total) by mouth  Once a day   . metoprolol succinate (TOPROL-XL) 25 mg Oral Tablet Sustained Release 24 hr TAKE 1 TABLET BY MOUTH EVERY DAY   . naphazoline-pheniramine (OPCON-A) 0.02675-0.315 % Ophthalmic Drops Instill 1 Drop into both eyes Once per day as needed   . olmesartan (BENICAR) 40 mg Oral Tablet Take 1 Tab (40 mg total) by mouth Once a day   . ondansetron (ZOFRAN) 4 mg Oral Tablet Take 1 Tab (4 mg total) by mouth Every 8 hours as needed for nausea/vomiting   . PROAIR HFA 90 mcg/actuation Inhalation HFA Aerosol Inhaler Take 1-2 Puffs by inhalation Every 6 hours as needed   . Sodium,Potassium,&Mag Sulfates (SUPREP BOWEL PREP KIT) 17.5-3.13-1.6 gram Oral Recon Soln Take 1 Bottle by mouth Every 12 hours       Objective:   BP 126/84   Pulse 93   Resp 18   Ht 1.829 m (6')   Wt 109.3 kg (241 lb)   SpO2 96%   BMI 32.69 kg/m       General appearance: alert, oriented x 3, in his normal state, cooperative, not in apparent distress,  appearing stated age   52:  Eyes:  Pupils equal round react like accommodation extraocular muscles intact. Ears within normal limits throat clear, tonsils normal, no cervical lymphadenopathy thyroid normal  Lungs: clear to auscultation bilaterally, respirations non-labored  Heart: regular rate and rhythm, S1, S2 normal, no murmur, PMI non-diffuse  Abdomen: soft, non-tender. Bowel sounds normal.  Extremities: extremities normal, atraumatic, no cyanosis or edema, pulses intact in upper and lower extremities    Assessment     ENCOUNTER DIAGNOSES     ICD-10-CM   1. Essential hypertension I10   2. Mixed hyperlipidemia E78.2   3. Type 2 diabetes mellitus without complication, without long-term current use of insulin (CMS HCC) E11.9   4. Depression, unspecified depression type F32.9       Plan     Orders Placed This Encounter   . CBC   . COMPREHENSIVE METABOLIC PNL, FASTING   . HGA1C (HEMOGLOBIN A1C WITH EST AVG GLUCOSE)   . LIPID PANEL   . colesevelam (WELCHOL) 625 mg Oral Tablet   . glipiZIDE (GLUCOTROL) 10 mg Oral Tablet             Patient is to continue on all current medications other than those changed in the orders.     1. Type 2 diabetes:  Patient is nonfasting today will come in tomorrow get lab work and will call with results.  I went ahead and send in a prescription for glipizide 10 mg. I have asked the patient to start checking his blood sugar daily and write those numbers down.  Recommend a low carb low sugar diet, exercise, and weight loss.  2. Hyperlipidemia:  Patient is on atorvastatin 80 mg so is maxed out on his statin and has not been taking his Welchol but is willing to take this in a tablet form so prescription was sent in  3. Hypertension:  Currently appears to be well managed  4. Depression:  Patient states he is doing well.      Nelda Severe Mel Almond, M.D.

## 2019-01-10 NOTE — Addendum Note (Signed)
Addended by: Beverlee Nims D on: 01/10/2019 08:44 AM     Modules accepted: Orders

## 2019-02-09 ENCOUNTER — Other Ambulatory Visit (INDEPENDENT_AMBULATORY_CARE_PROVIDER_SITE_OTHER): Payer: Self-pay | Admitting: Family Medicine

## 2019-02-09 ENCOUNTER — Telehealth (INDEPENDENT_AMBULATORY_CARE_PROVIDER_SITE_OTHER): Payer: Self-pay | Admitting: Family Medicine

## 2019-02-09 MED ORDER — TRIAMCINOLONE ACETONIDE 0.1 % TOPICAL CREAM: g | Freq: Two times a day (BID) | CUTANEOUS | 0 refills | 0 days | Status: AC

## 2019-02-09 NOTE — Telephone Encounter (Signed)
Pt's wife called and said his rash is worse and didn't know if you wanted to see pt today or could call hi in something? Please Advise. Lincoln Brigham, LPN  3/89/3734, 28:76

## 2019-03-08 ENCOUNTER — Other Ambulatory Visit (INDEPENDENT_AMBULATORY_CARE_PROVIDER_SITE_OTHER): Payer: Self-pay | Admitting: Family Medicine

## 2019-03-31 ENCOUNTER — Encounter (INDEPENDENT_AMBULATORY_CARE_PROVIDER_SITE_OTHER): Payer: Self-pay | Admitting: Family Medicine

## 2019-03-31 ENCOUNTER — Other Ambulatory Visit: Payer: BC Managed Care – PPO | Attending: Family Medicine | Admitting: Family Medicine

## 2019-03-31 ENCOUNTER — Ambulatory Visit (INDEPENDENT_AMBULATORY_CARE_PROVIDER_SITE_OTHER): Payer: BC Managed Care – PPO | Admitting: Family Medicine

## 2019-03-31 ENCOUNTER — Other Ambulatory Visit: Payer: Self-pay

## 2019-03-31 VITALS — BP 142/90 | HR 93 | Temp 98.3°F | Resp 18 | Ht 72.0 in | Wt 250.0 lb

## 2019-03-31 DIAGNOSIS — E119 Type 2 diabetes mellitus without complications: Secondary | ICD-10-CM

## 2019-03-31 DIAGNOSIS — I10 Essential (primary) hypertension: Secondary | ICD-10-CM

## 2019-03-31 DIAGNOSIS — R233 Spontaneous ecchymoses: Principal | ICD-10-CM

## 2019-03-31 DIAGNOSIS — E782 Mixed hyperlipidemia: Secondary | ICD-10-CM

## 2019-03-31 DIAGNOSIS — Z6833 Body mass index (BMI) 33.0-33.9, adult: Secondary | ICD-10-CM

## 2019-03-31 LAB — CBC
HCT: 47.8 % (ref 38.9–50.5)
HGB: 16.3 g/dL (ref 13.4–17.3)
MCH: 32.8 pg (ref 27.9–33.1)
MCHC: 34.1 g/dL (ref 32.8–36.0)
MCV: 96.2 fL — ABNORMAL HIGH (ref 82.4–95.0)
MPV: 7.9 fL (ref 6.0–10.2)
PLATELETS: 229 x10ˆ3/uL (ref 140–440)
RBC: 4.97 x10ˆ6/uL (ref 4.40–5.68)
RDW: 13.9 % (ref 10.9–15.1)
WBC: 7.2 x10ˆ3/uL (ref 3.3–9.3)

## 2019-03-31 LAB — COMPREHENSIVE METABOLIC PNL, FASTING
ALBUMIN: 4.2 g/dL (ref 3.2–4.6)
ALKALINE PHOSPHATASE: 77 U/L (ref 20–130)
ALT (SGPT): 52 U/L (ref <=52)
ANION GAP: 10 mmol/L
AST (SGOT): 43 U/L — ABNORMAL HIGH (ref <=35)
BILIRUBIN TOTAL: 0.6 mg/dL (ref 0.3–1.2)
BUN/CREA RATIO: 6
BUN: 6 mg/dL — ABNORMAL LOW (ref 10–25)
CALCIUM: 9.1 mg/dL (ref 8.8–10.3)
CHLORIDE: 95 mmol/L — ABNORMAL LOW (ref 98–111)
CREATININE: 0.96 mg/dL (ref <=1.30)
ESTIMATED GFR: >60 mL/min/1.73mˆ2
GLUCOSE: 166 mg/dL — ABNORMAL HIGH (ref 70–110)
PROTEIN TOTAL: 7 g/dL (ref 6.0–8.3)
SODIUM: 131 mmol/L — ABNORMAL LOW (ref 135–145)

## 2019-03-31 LAB — HGA1C (HEMOGLOBIN A1C WITH EST AVG GLUCOSE)
ESTIMATED AVERAGE GLUCOSE: 126 mg/dL
HEMOGLOBIN A1C: 6 % — ABNORMAL HIGH (ref 4.1–5.7)

## 2019-03-31 NOTE — Progress Notes (Signed)
[facility]  [address] [place] [address]  Dept Phone: [phone]  Dept Fax: [phone]    Zedrick Springsteen  1961/07/11  [id number]    Date of Service: 03/31/2019   Chief complaint:   Chief Complaint   Patient presents with   . Rash     rash on bilateral legs x 5-7 days    . Vomiting     pt has threw up 2 times in last  5 days but states he has a on going stomach issue since collage        Subjective:   Patient presented today for an acute visit due to rash on his legs.  Patient had sent a pursue a picture to my phone with a rash which appear to be petechiae about 3 days ago.  Patient was concerned that it was not resolving so he wanted to come in to be seen.  He denies any other issues other than he has episodic vomiting which she states has been going on since he was in college.  There is no real change.  He has vomited twice in the last 5 days but states that is not unusual.  As far as his anxiety and other issues he denies any issues or concerns today.    Current Outpatient Medications   Medication Sig   . amLODIPine (NORVASC) 5 mg Oral Tablet TAKE 1 TAB (5 MG TOTAL) BY MOUTH ONCE A DAY   . aspirin 81 mg Oral Tablet, Chewable Take 81 mg by mouth Once a day   . atorvastatin (LIPITOR) 80 mg Oral Tablet Take 1 Tab (80 mg total) by mouth Every night   . Blood Sugar Diagnostic (ONETOUCH VERIO) Strip 1 Strip by Does not apply route Twice daily   . cholecalciferol, vitamin D3, 1,000 unit Oral Tablet Take 1,000 Units by mouth Once a day   . colesevelam [facility]) 625 mg Oral Tablet Take 3 Tabs (1,875 mg total) by mouth Twice daily with food   . cyanocobalamin (VITAMIN B 12) 1,000 mcg Oral Tablet Take 1,000 mcg by mouth Once a day   . diazePAM (VALIUM) 5 mg Oral Tablet Take 1 Tab (5 mg total) by mouth Every 12 hours as needed for Anxiety   . diphenhydrAMINE (BENADRYL) 25 mg Oral Capsule Take 25 mg by mouth Every 6 hours as needed   . EPINEPHrine (EPIPEN) 0.3 mg/0.3 mL Injection  Auto-Injector 0.3 mL (0.3 mg total) by Intramuscular route Once, as needed for up to 1 dose   . FLUoxetine (PROZAC) 40 mg Oral Capsule Take 1 Cap (40 mg total) by mouth Once a day HVL   . fluticasone (FLONASE) 50 mcg/actuation Nasal Spray, Suspension 1 Spray by Each Nostril route Twice daily   . glipiZIDE (GLUCOTROL) 10 mg Oral Tablet Take 1 Tab (10 mg total) by mouth Once a day Take 30 minutes before meals   . Lancets Misc 1 Stick by Does not apply route Twice daily   . lansoprazole (PREVACID) 30 mg Oral Capsule, Delayed Release(E.C.) TAKE 1 CAPSULE BY MOUTH EVERY DAY   . loratadine (CLARITIN) 10 mg Oral Tablet Take 10 mg by mouth Once a day   . losartan (COZAAR) 50 mg Oral Tablet Take 1 Tab (50 mg total) by mouth Once a day   . metoprolol succinate (TOPROL-XL) 25 mg Oral Tablet Sustained Release 24 hr TAKE 1 TABLET BY MOUTH EVERY DAY   . naphazoline-pheniramine (OPCON-A) 0.02675-0.315 % Ophthalmic Drops Instill 1  Drop into both eyes Once per day as needed   . olmesartan (BENICAR) 40 mg Oral Tablet Take 1 Tab (40 mg total) by mouth Once a day   . ondansetron (ZOFRAN) 4 mg Oral Tablet Take 1 Tab (4 mg total) by mouth Every 8 hours as needed for nausea/vomiting   . PROAIR HFA 90 mcg/actuation Inhalation HFA Aerosol Inhaler Take 1-2 Puffs by inhalation Every 6 hours as needed   . Sodium,Potassium,&Mag Sulfates (SUPREP BOWEL PREP KIT) 17.5-3.13-1.6 gram Oral Recon Soln Take 1 Bottle by mouth Every 12 hours   . triamcinolone acetonide (ARISTOCORT A) 0.1 % Cream Apply topically Twice daily       Objective:   BP (!) 142/90   Pulse 93   Temp 36.8 C (98.3 F)   Resp 18   Ht 1.829 m (6')   Wt 113 kg (250 lb)   SpO2 97%   BMI 33.91 kg/m       General appearance: alert, oriented x 3, in his normal state, cooperative, not in apparent distress, appearing stated age   HEENT:  Eyes:  Pupils equal round react like accommodation extraocular muscles intact. Ears within normal limits throat clear, tonsils normal, no cervical  lymphadenopathy thyroid normal  Lungs: clear to auscultation bilaterally, respirations non-labored  Heart: regular rate and rhythm, S1, S2 normal, no murmur, PMI non-diffuse  Abdomen: soft, non-tender. Bowel sounds normal.  Extremities: extremities normal, atraumatic, no cyanosis or edema, pulses intact in upper and lower extremities.  Patient does have petechiae on his lower extremities from the knees down.  They appear to be fading.    Assessment     ENCOUNTER DIAGNOSES     ICD-10-CM   1. Essential hypertension I10   2. Mixed hyperlipidemia E78.2   3. Type 2 diabetes mellitus without complication, without long-term current use of insulin (CMS HCC) E11.9       Plan   No orders of the defined types were placed in this encounter.            Patient is to continue on all current medications other than those changed in the orders.     As far as his petechiae going to go ahead and do some lab work.  I do not see any medications in his medication list that appear to be a problem.  Will have patient monitor and let me know if they are worsening will call with results of the labs.  Far as his diabetes and blood pressure he is seems to be doing well blood pressure is up slightly today he states he does check it occasionally at home and it normally is good.    Nelda Severe Mel Almond, M.D.

## 2019-04-03 ENCOUNTER — Encounter (INDEPENDENT_AMBULATORY_CARE_PROVIDER_SITE_OTHER): Payer: Self-pay | Admitting: Family Medicine

## 2019-04-05 ENCOUNTER — Other Ambulatory Visit (INDEPENDENT_AMBULATORY_CARE_PROVIDER_SITE_OTHER): Payer: Self-pay | Admitting: Family Medicine

## 2019-04-11 ENCOUNTER — Telehealth (INDEPENDENT_AMBULATORY_CARE_PROVIDER_SITE_OTHER): Payer: Self-pay | Admitting: Family Medicine

## 2019-04-11 NOTE — Telephone Encounter (Signed)
I personally offered the service to the patient, and obtained verbal consent to provide this service.    Lincoln Brigham, LPN  3 month follow up  Lincoln Brigham, LPN  5/50/0164, 29:03

## 2019-04-18 ENCOUNTER — Encounter (INDEPENDENT_AMBULATORY_CARE_PROVIDER_SITE_OTHER): Payer: Self-pay | Admitting: Family Medicine

## 2019-04-18 ENCOUNTER — Other Ambulatory Visit: Payer: Self-pay

## 2019-04-18 ENCOUNTER — Telehealth: Payer: BC Managed Care – PPO | Admitting: Family Medicine

## 2019-04-18 VITALS — BP 124/72 | Ht 72.0 in | Wt 250.0 lb

## 2019-04-18 DIAGNOSIS — E782 Mixed hyperlipidemia: Secondary | ICD-10-CM

## 2019-04-18 DIAGNOSIS — I10 Essential (primary) hypertension: Secondary | ICD-10-CM

## 2019-04-18 DIAGNOSIS — E119 Type 2 diabetes mellitus without complications: Secondary | ICD-10-CM

## 2019-04-18 NOTE — Nursing Note (Signed)
I personally offered the service to the patient, and obtained verbal consent to provide this service.    Florence Antonelli, LPN

## 2019-04-18 NOTE — Progress Notes (Signed)
[facility]  [address] [place] [address]  Dept Phone: [phone]  Dept Fax: [phone]    Danny [name]  05-07-1961  [id number]    Date of Service: 04/18/2019   Chief complaint:   Chief Complaint   Patient presents with   . Follow Up 3 Months     Pre- Diabetic , HTN , Hyperlipidemia , Depression        Subjective:   Patient was evaluated today via telephone visit.  I was located here in my office in [facility] and he was located at his home or 2nd home and [place].  Patient requested the telephone visit understands and accepts the limitations and gave verbal permission for the telephone visit.  We primarily discussed his chronic medical problems which include his hypertension, hyperlipidemia, and type 2 diabetes.  Patient was also seen recently for rash which she states it is resolving as we had discussed earlier.  As far as his blood sugars he states his blood sugars have been good.  As far as his blood pressure he does not check it regularly but when he does he states it is excellent at home.  The last time he checked it was 124/72.  Far as his hyperlipidemia he states he denies chest pain or shortness of breath.    Current Outpatient Medications   Medication Sig   . amLODIPine (NORVASC) 5 mg Oral Tablet TAKE 1 TAB (5 MG TOTAL) BY MOUTH ONCE A DAY   . aspirin 81 mg Oral Tablet, Chewable Take 81 mg by mouth Once a day   . atorvastatin (LIPITOR) 80 mg Oral Tablet Take 1 Tab (80 mg total) by mouth Every night   . Blood Sugar Diagnostic (ONETOUCH VERIO) Strip 1 Strip by Does not apply route Twice daily   . cholecalciferol, vitamin D3, 1,000 unit Oral Tablet Take 1,000 Units by mouth Once a day   . colesevelam [facility]) 625 mg Oral Tablet Take 3 Tabs (1,875 mg total) by mouth Twice daily with food   . cyanocobalamin (VITAMIN B 12) 1,000 mcg Oral Tablet Take 1,000 mcg by mouth Once a day   . diazePAM (VALIUM) 5 mg Oral Tablet Take 1 Tab (5 mg  total) by mouth Every 12 hours as needed for Anxiety   . diphenhydrAMINE (BENADRYL) 25 mg Oral Capsule Take 25 mg by mouth Every 6 hours as needed   . EPINEPHrine (EPIPEN) 0.3 mg/0.3 mL Injection Auto-Injector 0.3 mL (0.3 mg total) by Intramuscular route Once, as needed for up to 1 dose   . FLUoxetine (PROZAC) 40 mg Oral Capsule Take 1 Cap (40 mg total) by mouth Once a day HVL   . fluticasone (FLONASE) 50 mcg/actuation Nasal Spray, Suspension 1 Spray by Each Nostril route Twice daily   . glipiZIDE (GLUCOTROL) 10 mg Oral Tablet Take 1 Tab (10 mg total) by mouth Once a day Take 30 minutes before meals   . Lancets Misc 1 Stick by Does not apply route Twice daily   . lansoprazole (PREVACID) 30 mg Oral Capsule, Delayed Release(E.C.) TAKE 1 CAPSULE BY MOUTH EVERY DAY   . loratadine (CLARITIN) 10 mg Oral Tablet Take 10 mg by mouth Once a day   . losartan (COZAAR) 50 mg Oral Tablet TAKE 1 TAB (50 MG TOTAL) BY MOUTH ONCE A DAY   . metoprolol succinate (TOPROL-XL) 25 mg Oral Tablet Sustained Release 24 hr TAKE 1 TABLET BY MOUTH EVERY DAY   .  naphazoline-pheniramine (OPCON-A) 0.02675-0.315 % Ophthalmic Drops Instill 1 Drop into both eyes Once per day as needed   . olmesartan (BENICAR) 40 mg Oral Tablet Take 1 Tab (40 mg total) by mouth Once a day   . ondansetron (ZOFRAN) 4 mg Oral Tablet Take 1 Tab (4 mg total) by mouth Every 8 hours as needed for nausea/vomiting   . PROAIR HFA 90 mcg/actuation Inhalation HFA Aerosol Inhaler Take 1-2 Puffs by inhalation Every 6 hours as needed   . Sodium,Potassium,&Mag Sulfates (SUPREP BOWEL PREP KIT) 17.5-3.13-1.6 gram Oral Recon Soln Take 1 Bottle by mouth Every 12 hours   . triamcinolone acetonide (ARISTOCORT A) 0.1 % Cream Apply topically Twice daily       Objective:   Ht 1.829 m (6')   Wt 113 kg (250 lb)   BMI 33.91 kg/m           Assessment     ENCOUNTER DIAGNOSES     ICD-10-CM   1. Essential hypertension I10   2. Mixed hyperlipidemia E78.2   3. Type 2 diabetes mellitus without  complication, without long-term current use of insulin (CMS HCC) E11.9       Plan   No orders of the defined types were placed in this encounter.            Patient is to continue on all current medications other than those changed in the orders.     1. Hypertension:  Currently appears to be well managed  2. Hyperlipidemia:  Time to check lab work and will do that at his next appointment  3. Type 2 diabetes:  Appears to be currently well managed last hemoglobin A1c was 6.0    This telephone visit took 15 minutes    Nelda Severe. Mel Almond, M.D.

## 2019-05-18 ENCOUNTER — Ambulatory Visit (HOSPITAL_BASED_OUTPATIENT_CLINIC_OR_DEPARTMENT_OTHER): Payer: Self-pay | Admitting: Surgery

## 2019-05-18 ENCOUNTER — Other Ambulatory Visit (HOSPITAL_BASED_OUTPATIENT_CLINIC_OR_DEPARTMENT_OTHER): Payer: Self-pay | Admitting: Surgery

## 2019-05-18 DIAGNOSIS — Z01818 Encounter for other preprocedural examination: Secondary | ICD-10-CM

## 2019-05-18 NOTE — Telephone Encounter (Signed)
Left message on voicemail informing patient that he will need to get covid test done 2-3 days prior to procedure. Informed him to call to let me know that he received my message.

## 2019-05-19 ENCOUNTER — Ambulatory Visit: Payer: BC Managed Care – PPO | Attending: Surgery

## 2019-05-19 DIAGNOSIS — Z01812 Encounter for preprocedural laboratory examination: Secondary | ICD-10-CM | POA: Insufficient documentation

## 2019-05-19 DIAGNOSIS — Z01818 Encounter for other preprocedural examination: Secondary | ICD-10-CM

## 2019-05-19 DIAGNOSIS — Z1159 Encounter for screening for other viral diseases: Secondary | ICD-10-CM | POA: Insufficient documentation

## 2019-05-19 LAB — COVID-19 ~~LOC~~ MOLECULAR LAB TESTING: 2019-nCoV/SARS-CoV-2: NOT DETECTED

## 2019-05-24 ENCOUNTER — Telehealth (HOSPITAL_COMMUNITY): Payer: Self-pay

## 2019-05-24 NOTE — Telephone Encounter (Signed)
Office was notified regarding outdated covid test. Dr. Rowe Clack is ok to proceed with this procedure.

## 2019-05-26 ENCOUNTER — Other Ambulatory Visit: Payer: Self-pay

## 2019-05-26 ENCOUNTER — Encounter (HOSPITAL_COMMUNITY): Payer: Self-pay

## 2019-05-26 ENCOUNTER — Telehealth (HOSPITAL_COMMUNITY): Payer: Self-pay

## 2019-05-26 NOTE — Telephone Encounter (Signed)
COVID-19 Admission Screen    Low Risk:   Able to Provide asymptomatic History  No recent Travel/ Following Stay at Home  No Sick Contacts  No New Household Conatcts    Moderate/High Risk: {None    Test Result:Negative: Date: 05/19/2019   It is documented in Epic notes that Dr. Rowe Clack is aware of the outdated preop covid test.

## 2019-05-29 ENCOUNTER — Other Ambulatory Visit (HOSPITAL_COMMUNITY): Payer: Self-pay | Admitting: Surgery

## 2019-05-29 ENCOUNTER — Encounter (HOSPITAL_COMMUNITY): Payer: Self-pay

## 2019-05-29 ENCOUNTER — Ambulatory Visit (HOSPITAL_BASED_OUTPATIENT_CLINIC_OR_DEPARTMENT_OTHER): Payer: BC Managed Care – PPO | Admitting: Certified Registered"

## 2019-05-29 ENCOUNTER — Inpatient Hospital Stay
Admission: RE | Admit: 2019-05-29 | Discharge: 2019-05-29 | Disposition: A | Discharge status: Home / Self Care | Payer: BC Managed Care – PPO | Source: Ambulatory Visit | Attending: Surgery | Admitting: Surgery

## 2019-05-29 ENCOUNTER — Other Ambulatory Visit: Payer: Self-pay

## 2019-05-29 ENCOUNTER — Encounter (HOSPITAL_COMMUNITY)
Admission: RE | Disposition: A | Discharge status: Home / Self Care | Payer: Self-pay | Source: Ambulatory Visit | Attending: Surgery

## 2019-05-29 ENCOUNTER — Ambulatory Visit (HOSPITAL_COMMUNITY): Payer: BC Managed Care – PPO | Admitting: Certified Registered"

## 2019-05-29 DIAGNOSIS — D120 Benign neoplasm of cecum: Secondary | ICD-10-CM

## 2019-05-29 DIAGNOSIS — E785 Hyperlipidemia, unspecified: Secondary | ICD-10-CM | POA: Insufficient documentation

## 2019-05-29 DIAGNOSIS — I10 Essential (primary) hypertension: Secondary | ICD-10-CM | POA: Insufficient documentation

## 2019-05-29 DIAGNOSIS — E119 Type 2 diabetes mellitus without complications: Secondary | ICD-10-CM | POA: Insufficient documentation

## 2019-05-29 DIAGNOSIS — K621 Rectal polyp: Secondary | ICD-10-CM | POA: Insufficient documentation

## 2019-05-29 DIAGNOSIS — K210 Gastro-esophageal reflux disease with esophagitis: Secondary | ICD-10-CM

## 2019-05-29 DIAGNOSIS — F410 Panic disorder [episodic paroxysmal anxiety] without agoraphobia: Secondary | ICD-10-CM | POA: Insufficient documentation

## 2019-05-29 DIAGNOSIS — Z79899 Other long term (current) drug therapy: Secondary | ICD-10-CM | POA: Insufficient documentation

## 2019-05-29 DIAGNOSIS — Z7952 Long term (current) use of systemic steroids: Secondary | ICD-10-CM | POA: Insufficient documentation

## 2019-05-29 DIAGNOSIS — Z7951 Long term (current) use of inhaled steroids: Secondary | ICD-10-CM | POA: Insufficient documentation

## 2019-05-29 DIAGNOSIS — Z7982 Long term (current) use of aspirin: Secondary | ICD-10-CM | POA: Insufficient documentation

## 2019-05-29 DIAGNOSIS — Z1211 Encounter for screening for malignant neoplasm of colon: Principal | ICD-10-CM | POA: Insufficient documentation

## 2019-05-29 DIAGNOSIS — Z885 Allergy status to narcotic agent status: Secondary | ICD-10-CM | POA: Insufficient documentation

## 2019-05-29 DIAGNOSIS — J309 Allergic rhinitis, unspecified: Secondary | ICD-10-CM | POA: Insufficient documentation

## 2019-05-29 DIAGNOSIS — Z6832 Body mass index (BMI) 32.0-32.9, adult: Secondary | ICD-10-CM | POA: Insufficient documentation

## 2019-05-29 DIAGNOSIS — G4730 Sleep apnea, unspecified: Secondary | ICD-10-CM | POA: Insufficient documentation

## 2019-05-29 DIAGNOSIS — Z7984 Long term (current) use of oral hypoglycemic drugs: Secondary | ICD-10-CM | POA: Insufficient documentation

## 2019-05-29 DIAGNOSIS — E669 Obesity, unspecified: Secondary | ICD-10-CM | POA: Insufficient documentation

## 2019-05-29 HISTORY — DX: Presence of spectacles and contact lenses: Z97.3

## 2019-05-29 HISTORY — DX: Panic disorder (episodic paroxysmal anxiety): F41.0

## 2019-05-29 HISTORY — DX: Anxiety disorder, unspecified: F41.9

## 2019-05-29 SURGERY — COLONOSCOPY
Anesthesia: IV General | Wound class: Clean Contaminated Wounds-The respiratory, GI, Genital, or urinary

## 2019-05-29 MED ORDER — SODIUM CHLORIDE 0.9 % (FLUSH) INJECTION SYRINGE
3.0000 mL | INJECTION | INTRAMUSCULAR | Status: DC | PRN
Start: 2019-05-29 — End: 2019-05-29
  Administered 2019-05-29: 3 mL

## 2019-05-29 MED ORDER — SIMETHICONE 40 MG/0.6 ML ORAL DROPS,SUSPENSION
Freq: Once | ORAL | Status: DC | PRN
Start: 2019-05-29 — End: 2019-05-29
  Administered 2019-05-29: 11:00:00 40 mg via ORAL

## 2019-05-29 MED ORDER — ASPIRIN 81 MG CHEWABLE TABLET
81.0000 mg | CHEWABLE_TABLET | Freq: Every day | ORAL | Status: AC
Start: 2019-06-01 — End: ?

## 2019-05-29 MED ORDER — LACTATED RINGERS INTRAVENOUS SOLUTION
INTRAVENOUS | Status: DC | PRN
Start: 2019-05-29 — End: 2019-05-29
  Administered 2019-05-29: 11:00:00 via INTRAVENOUS

## 2019-05-29 MED ORDER — LACTATED RINGERS INTRAVENOUS SOLUTION
INTRAVENOUS | Status: DC
Start: 2019-05-29 — End: 2019-05-29
  Administered 2019-05-29: 09:00:00 via INTRAVENOUS

## 2019-05-29 MED ORDER — LIDOCAINE (PF) 100 MG/5 ML (2 %) INTRAVENOUS SYRINGE
INJECTION | Freq: Once | INTRAVENOUS | Status: DC | PRN
Start: 2019-05-29 — End: 2019-05-29
  Administered 2019-05-29: 50 mg via INTRAVENOUS

## 2019-05-29 MED ORDER — PROPOFOL 10 MG/ML IV BOLUS
INJECTION | Freq: Once | INTRAVENOUS | Status: DC | PRN
Start: 2019-05-29 — End: 2019-05-29
  Administered 2019-05-29: 11:00:00 50 mg via INTRAVENOUS

## 2019-05-29 MED ORDER — PROPOFOL 10 MG/ML IV - CHI
INTRAVENOUS | Status: DC | PRN
Start: 2019-05-29 — End: 2019-05-29
  Administered 2019-05-29: 150 ug/kg/min via INTRAVENOUS

## 2019-05-29 MED ADMIN — sodium chloride 0.9 % (flush) injection syringe: @ 09:00:00

## 2019-05-29 MED ADMIN — lactated Ringers intravenous solution: INTRAVENOUS | @ 09:00:00

## 2019-05-29 MED ADMIN — propofol 10 mg/mL intravenous emulsion: INTRAVENOUS | @ 11:00:00

## 2019-05-29 MED ADMIN — lactated Ringers intravenous solution: INTRAVENOUS | @ 11:00:00

## 2019-05-29 MED ADMIN — simethicone 40 mg/0.6 mL oral drops,suspension: ORAL | @ 11:00:00

## 2019-05-29 MED ADMIN — lidocaine (PF) 100 mg/5 mL (2 %) intravenous syringe: INTRAVENOUS | @ 11:00:00

## 2019-05-29 SURGICAL SUPPLY — 45 items
BITE BLOCK 60FR MAJ1632_50EA/BX (AIR)
BLOCK BITE 60FR ADULT STRAP FLXB SH LF  DISP (AIR) IMPLANT
BRUSH CYTO 1.5MM 140CM BLT TIP ERG HNDL ATO STP SHEATH CLBR STRL DISP PTFE BRONCHSCP (CANNULA) IMPLANT
BRUSH CYTOLOGY BRONCHOSCOPY ERG HNDL ATO STP SHEATH CLBR (CANNULA)
BRUSH ENDOSCP CLN 90IN 5MM 2 END NONST LF (ENDOSCOPIC SUPPLIES) IMPLANT
CATH ELHMST INJ GLD PRB 7FR 25 GA .24MM 210CM BIPO RND DIST (BALLOON)
CATH ELHMST INJ GLD PROBE 7FR 25GA .24MM 210CM BIPOLAR RND DIST TIP STD CONN INTGR DISP 2.8MM MN WRK (BALLOON) IMPLANT
CLIP HMST MR CONDITIONAL BRD CATH ROT CONTROL KNOB NO SHEATH RSL 360 235CM 2.8MM 11MM OPN (SURGICAL INSTRUMENTS) IMPLANT
DEVICE HEMOSTASIS CLIP 360_235CM RESOLUTION (INSTRUMENTS)
DISCONTINUED USE ITEM 328361 - JELLY LUB EZ BCTRST H2O SOL NG_RS FLPTP TUBE STRL 2OZ LF (MISCELLANEOUS PT CARE ITEMS) IMPLANT
ELECTRODE PATIENT RTN 9FT VLAB C30- LB RM PHSV ACRL FOAM CORD NONIRRITATE NONSENSITIZE ADH STRP (CAUTERY SUPPLIES) IMPLANT
FORCEPS BIOPSY HOT 240CM 2.2MM RJ 4 +2.8MM DISP (INSTRUMENTS)
FORCEPS BIOPSY HOT 240CM 2.2MM RJ 4 +2.8MM DISPO (SURGICAL INSTRUMENTS) IMPLANT
FORCEPS BIOPSY HOT 240CM 2.2MM_RJ 4 +2.8MM DISP (INSTRUMENTS)
FORCEPS BIOPSY LRG CPC NEEDLE 240CM 2.2MM RJ 3 DISP ORNG (ENDOSCOPIC SUPPLIES) ×1 IMPLANT
FORCEPS BIOPSY NEEDLE 160CM 1. 8MM RJ 4 PED 2+ MM DISP (INSTRUMENTS)
FORCEPS BIOPSY NEEDLE 160CM 1.8MM RJ 4 DISP YW 2MM WRK CHNL GSPED (SURGICAL INSTRUMENTS) IMPLANT
FORCEPS BIOPSY NEEDLE 240CM RJ 4 JMB (SURGICAL INSTRUMENTS) ×1 IMPLANT
FORCEPS BIOPSY NEEDLE 240CM RJ 4 JMB DISP (INSTRUMENTS) ×1
GW ENDOSCOPIC .035IN 260CM DREAMWIRE ANG RX EGLD NITINOL BIL STRL DISP (ENDOSCOPIC SUPPLIES) IMPLANT
JELLY LUB EZ BCTRST H2O SOL NG RS FLPTP TUBE STRL 2OZ LF (MISCELLANEOUS PT CARE ITEMS)
JELLY LUB EZ BCTRST H2O SOL NG RS FLPTP TUBE STRL 4OZ LF (MISCELLANEOUS PT CARE ITEMS)
JELLY LUB EZ BCTRST H2O SOL NG_RS FLPTP TUBE STRL 2OZ LF (MISCELLANEOUS PT CARE ITEMS)
JELLY LUB EZ BCTRST WATER SOL NGRS FLIPTOP TUBE STRL 4OZ LF (MISCELLANEOUS PT CARE ITEMS) IMPLANT
KIT CAN MDVC ASCP SPECI CNVRT_NONST LF (TEST)
LIGATOR 2.8MM 8.6-11.5MM SSS7 ESOPH 1 STNG MLT BAND HNDL STRL DISP ENDOS HMSTS LF (GI LAB SUPPLIES) IMPLANT
LIGATOR 2.8MM 8.6-11.5MM SSS7_ESOPH 1 STNG MLT BAND ERG HNDL (GI LAB SUPPLIES)
MARKER ENDOS PMT ENDOMARK DIL INDIA INK 10ML STRL (GI LAB SUPPLIES) IMPLANT
MARKER ENDOS SPOT EX PERM IND DRK SYRG 5ML (GENE) IMPLANT
MARKER ENDOS SPOT EX PREFL PRE ASSEMBLE SYRG PERM (GENE)
NEEDLE SCLRTX 25GA 2.3MM BVL STRL DISP STAR CATH INTJCT 4MM 240CM (NEEDLES & SYRINGE SUPPLIES) IMPLANT
NEEDLE SCLRTX 25GA 2.3MM BVL S_TRL DISP STAR CATH INTJCT 4MM (NEEDLES & SYRINGE SUPPLIES)
NET SPEC RETR 230CM 2.5MM RTHN T STD SHTH 6X3CM NONST LF (Dilators)
NET SPEC RETR 230CM 2.5MM RTHNT STD SHEATH 6X3CM NONST LF  DISP (Dilators) IMPLANT
PEN SURG MRKNG INDIA INK (GI LAB SUPPLIES)
PROBE ESURG 220CM 2.3MM FIAPC FLXB STR FIRE STRL DISP (CAUTERY SUPPLIES) IMPLANT
PROBE ESURG 220CM 2.3MM FIAPC_FLXB STR FIRE ARGON PLAS COAG (CAUTERY SUPPLIES)
SNARE 230CM 2.5MM 3CM PLPK ROTR RTHNT ENDOS NONST LF  DISP (INT) IMPLANT
SNARE LRG OVL MED STF ENDOS OL MPS DISP (INSTRUMENTS ENDOMECHANICAL)
SNARE MED OVAL 240CM 2.4MM CAPTIVATR STF ENDOS PLYP 27MM DISP (UROLOGICAL SUPPLIES) IMPLANT
SNARE MED OVL 240CM 2.4MM CAPT IVATR STF ENDOS PLYP 27MM DISP (UROLOGICAL SUPPLIES)
SOCK CAN MEDIVAC ASCP SPECI CNVRT NONST LF (TEST) IMPLANT
TRAP LL CONN REM SCRN INSTREAM SPECI POLYPROP DISP (INSTRUMENTS)
TRAP LL CONN REM SCRN INSTREAM SPECI POLYPROP DISP (SURGICAL INSTRUMENTS) IMPLANT
TRAY GASTRIC LAV 36IN 34FR ARGYLE EDLICH MONOJECT LRG PVC 4 EYE CLS END GRAD SYRG TRNSPR 140CC NONST (TRAY) IMPLANT

## 2019-05-29 NOTE — Anesthesia Preprocedure Evaluation (Signed)
ANESTHESIA PRE-OP EVALUATION  Planned Procedure: COLONOSCOPY (N/A )  GASTROSCOPY (N/A )  Review of Systems         patient summary reviewed  nursing notes reviewed        Pulmonary   sleep apnea (does not wear cpap),   Cardiovascular    Hypertension, well controlled and ECG reviewed ,       GI/Hepatic/Renal    GERD and well controlled     Endo/Other    obesity,   type 2 diabetes/ controlled with diet     Neuro/Psych/MS    anxiety     Cancer                     Physical Assessment      Patient summary reviewed and Nursing notes reviewed   Airway       Mallampati: II    TM distance: >3 FB    Neck ROM: full  Mouth Opening: fair.            Dental       Dentition intact             Pulmonary           Cardiovascular             Other findings            Plan  ASA 2     Planned anesthesia type: TIVA              Intravenous induction     Anesthesia issues/risks discussed are: Dental Injuries and Aspiration.  Anesthetic plan and risks discussed with patient.          Patient's NPO status is appropriate for Anesthesia.           Plan discussed with physician.

## 2019-05-29 NOTE — H&P (Signed)
[facility]  Clinic History & Physical    Patient: Jermani Eberlein  D.O.B.: 08-Apr-1961  MRN# [id number]  Date of Service: 05/29/2019   REFERRING PROVIDER: Wonda Horner.    HPI  Izzac Rockett is a 58 y.o. male who was seen today for Colonoscopy. He has a screening colonoscopy in 2015 at [facility]. He had poor prep with stool throughout the entire colon. Recommended a repeat colonoscopy in one year. He denies change in bowel habits, rectal bleeding, weight loss or abdominal pain. Negative family history of colon or rectal cancer.     He has an EGD in 2018 with another provider due to reflux. There was a hyperplastic polyp which was indefinite for dysplasia. Recommended a repeat EGD in three months and he has not had that done. He is currently taking Prevacid daily. Denies epigastric pain.          Past Medical History:   Diagnosis Date   . Elevated lipids    . GERD (gastroesophageal reflux disease)    . Hypertension    . Sleep apnea    . Vomiting                 Past Surgical History:   Procedure Laterality Date   . COLONOSCOPY  2015    [facility]   . ESOPHAGOGASTRODUODENOSCOPY  2015    [place]  07/28/2017    Dr. Stacie Glaze    . HX ELBOW SURGERY Left 2011   . HX HAND SURGERY Left 06/2013    incision and drainage   . HX HAND SURGERY Left     incision and drainage   . HX WISDOM TEETH EXTRACTION                 Current Outpatient Medications   Medication Sig   . amLODIPine (NORVASC) 5 mg Oral Tablet Take 1 Tab (5 mg total) by mouth Once a day   . ARIPiprazole (ABILIFY) 5 mg Oral Tablet Take 5 mg by mouth Once a day   . aspirin 81 mg Oral Tablet, Chewable Take 81 mg by mouth Once a day   . atorvastatin (LIPITOR) 80 mg Oral Tablet Take 1 Tab (80 mg total) by mouth Every night   . Blood Sugar Diagnostic (ONETOUCH VERIO) Strip 1 Strip by Does not apply route Twice daily   . cholecalciferol, vitamin D3, 1,000 unit Oral Tablet Take 1,000 Units by mouth Once a day      . Colesevelam [facility]) 3.75 gram Oral Powder in Packet Take 3.75 g by mouth Once a day   . cyanocobalamin (VITAMIN B 12) 1,000 mcg Oral Tablet Take 1,000 mcg by mouth Once a day   . diazePAM (VALIUM) 5 mg Oral Tablet Take 1 Tab (5 mg total) by mouth Every 12 hours as needed for Anxiety   . diphenhydrAMINE (BENADRYL) 25 mg Oral Capsule Take 25 mg by mouth Every 6 hours as needed   . EPINEPHrine (EPIPEN) 0.3 mg/0.3 mL Injection Auto-Injector 0.3 mL (0.3 mg total) by Intramuscular route Once, as needed for up to 1 dose   . FLUoxetine (PROZAC) 40 mg Oral Capsule Take 1 Cap (40 mg total) by mouth Once a day HVL   . fluticasone (FLONASE) 50 mcg/actuation Nasal Spray, Suspension 1 Spray by Each Nostril route Twice daily   . Lancets Misc 1 Stick by Does not apply route Twice daily   . lansoprazole (PREVACID SOLUTAB) 15 mg Oral Tablet,Rapid  Dissolve, DR Take 15 mg by mouth Once a day   . lansoprazole (PREVACID) 30 mg Oral Capsule, Delayed Release(E.C.) Take 1 Cap (30 mg total) by mouth Once a day   . loratadine (CLARITIN) 10 mg Oral Tablet Take 10 mg by mouth Once a day   . losartan (COZAAR) 50 mg Oral Tablet Take 1 Tab (50 mg total) by mouth Once a day   . metFORMIN (GLUCOPHAGE) 500 mg Oral Tablet Take 1 Tab (500 mg total) by mouth Every morning with breakfast (Patient not taking: Reported on 08/24/2018)   . metoprolol succinate (TOPROL-XL) 25 mg Oral Tablet Sustained Release 24 hr Take 1 Tab (25 mg total) by mouth Once a day   . naphazoline-pheniramine (OPCON-A) 0.02675-0.315 % Ophthalmic Drops Instill 1 Drop into both eyes Once per day as needed   . olmesartan (BENICAR) 40 mg Oral Tablet Take 1 Tab (40 mg total) by mouth Once a day   . ondansetron (ZOFRAN) 4 mg Oral Tablet Take 1 Tab (4 mg total) by mouth Every 8 hours as needed for nausea/vomiting   . PROAIR HFA 90 mcg/actuation Inhalation HFA Aerosol Inhaler Take 1-2 Puffs by inhalation Every 6 hours as needed   . Sodium,Potassium,&Mag Sulfates (SUPREP BOWEL PREP KIT)  17.5-3.13-1.6 gram Oral Recon Soln Take 1 Bottle by mouth Every 12 hours          Allergies   Allergen Reactions   . Percocet [Oxycodone] Anaphylaxis      Social History           Tobacco Use   . Smoking status: Never Smoker   . Smokeless tobacco: Never Used   Substance Use Topics   . Alcohol use: Yes     Alcohol/week: 30.0 standard drinks     Types: 30 Cans of beer per week     Social History         Substance and Sexual Activity   Drug Use No           Family Medical History:        Problem Relation (Age of Onset)    Alzheimer's/Dementia Father    Cancer Other, Mother, Sister    HTN <20 y.o. Other    Healthy Sister, Son, Son    Hypertension (High Blood Pressure) Father                   ROS:  General: Denies fever, chills, or any changes in weight.  EENT: Denies blurry vision, hearing loss.  Cardiovascular: Denies chest pain or palpitations.  Respiratory: Denies SOB, cough.  Gastrointestinal: Denies abdominal pain, N/V, constipation, or diarrhea. Denies any change in bowel movements or rectal bleeding.  GU: Denies dysuria, change in urinary habits, or hematuria.  Musculoskeletal: Denies weakness or joint pain. Denies trouble moving extremities.  Neurological: Denies HA, dizziness.  Hematologic: Denies hx of bleeding disorders or blood clots.  Psychiatric: Denies changes in mood, anxiety, or depression.    Objective:  BP (!) 153/102   Pulse (!) 120   Temp 36.3 C (97.3 F)   Resp 18   Ht 1.829 m (6')   Wt 109 kg (240 lb)   SpO2 97%   BMI 32.55 kg/m          Physical Exam:  Constitutional:  Alert, healthy, well nourished, no acute distress and not in pain.  HEENT: Normal  Neck: Trachea midline, supple.  Pulmonary: Normal effort and rate observed.  Cardiovascular: Regular rate and rhythm.  Abdomen/GI: Abdomen  soft and supple; No tenderness. Non-distended; No masses present. Bowel sounds normal and active in all 4 quadrants.  Extremities: No peripheral edema; No cyanosis or clubbing of  nails.  Musculoskeletal: Normal muscle strength and tone of all four extremities.  Skin: No rashes or lesions present; Warm and dry. No jaundice.  Psychiatric: Normal mood and affect; Judgement and thought content normal.      Assessment:       ENCOUNTER DIAGNOSES     ICD-10-CM   1. Screening for colon cancer Z12.11   2. Gastric polyp K31.7   3. Preoperative testing Z01.818        Plan:         Orders Placed This Encounter   Procedures   . LOWER ENDOSCOPY ([facility])     Standing Status:   Future     Standing Expiration Date:   11/10/2019     Order Specific Question:   Type of process requested     Answer:   COLONOSCOPY     Order Specific Question:   Serv. Provider     Answer:   Rowe Clack     Order Specific Question:   Stop anticoagulation therapy     Answer:   Other     Order Specific Question:   Medical Clearance     Answer:   N/A     Order Specific Question:   Type of anesthesia     Answer:   OTHER   . UPPER ENDOSCOPY ([facility])     Standing Status:   Future     Standing Expiration Date:   11/10/2019     Order Specific Question:   Indication     Answer:   Screening     Order Specific Question:   Type of process requested     Answer:   EGD     Order Specific Question:   Serv. Provider     Answer:   Rowe Clack     Order Specific Question:   Stop anticoagulation therapy     Answer:   No     Order Specific Question:   Medical Clearance     Answer:   N/A     Order Specific Question:   Type of anesthesia     Answer:   OTHER   . CBC/DIFF     Standing Status:   Future     Standing Expiration Date:   01/10/2020   . COMPREHENSIVE METABOLIC PANEL, NON-FASTING     Standing Status:   Future     Standing Expiration Date:   01/10/2020     Medication Orders   Medications   . Sodium,Potassium,&Mag Sulfates (SUPREP BOWEL PREP KIT) 17.5-3.13-1.6 gram Oral Recon Soln     Sig: Take 1 Bottle by mouth Every 12 hours     Dispense:  1 Bottle     Refill:  0     Patient  for EGD    #1 The patient understands the risks of an EGD to include bleeding, infection, tear of the esophagus, stomach or bowel, need for emergency surgical intervention, missed or lack of findings etc.     Patient for colonoscopy    #1 I explained the risks of colonoscopy to the patient including bleeding, infection, missed polyps, inability to attain the cecum and need for barium enema, possibility of perforation and need for emergency surgery etc. The patient agrees to proceed.     #2 The patient will have preoperative bowel prep and  laboratory studies.           Return if symptoms worsen or fail to improve.    He was given the opportunity to ask questions and those questions were appropriately answered. He agreed with the treatment plan and is encouraged to call with any additional questions or concerns.

## 2019-05-29 NOTE — Anesthesia Postprocedure Evaluation (Signed)
Anesthesia Post Op Evaluation    Patient: Danny [name]  Procedure(s):  COLONOSCOPY with bx  GASTROSCOPY with  bx    Last Vitals:Temperature: 36.8 C (98.2 F) (05/29/19 1113)  Heart Rate: 78 (05/29/19 1113)  BP (Non-Invasive): 128/85 (05/29/19 1113)  Respiratory Rate: 18 (05/29/19 1113)  SpO2: 98 % (05/29/19 1113)    Patient is sufficiently recovered from the effects of anesthesia to participate in the evaluation and has returned to their pre-procedure level.  Patient location during evaluation: bedside       Patient participation: complete - patient participated  Level of consciousness: awake and alert and responsive to verbal stimuli    Pain management: adequate  Airway patency: patent    Anesthetic complications: no  Cardiovascular status: acceptable  Respiratory status: acceptable  Hydration status: acceptable  Patient post-procedure temperature: Pt Normothermic   PONV Status: Absent

## 2019-05-29 NOTE — Discharge Instructions (Signed)
EGD and colonoscopy instructions given and reviewed. Maisha Bogen, RN

## 2019-06-30 ENCOUNTER — Other Ambulatory Visit (INDEPENDENT_AMBULATORY_CARE_PROVIDER_SITE_OTHER): Payer: Self-pay | Admitting: Family Medicine

## 2019-07-02 LAB — HISTORICAL SURGICAL PATHOLOGY SPECIMEN

## 2019-07-03 ENCOUNTER — Other Ambulatory Visit (INDEPENDENT_AMBULATORY_CARE_PROVIDER_SITE_OTHER): Payer: Self-pay | Admitting: Family Medicine

## 2019-07-11 LAB — HISTORICAL SURGICAL PATHOLOGY SPECIMEN

## 2019-07-15 ENCOUNTER — Other Ambulatory Visit (INDEPENDENT_AMBULATORY_CARE_PROVIDER_SITE_OTHER): Payer: Self-pay | Admitting: Family Medicine

## 2019-07-17 ENCOUNTER — Other Ambulatory Visit: Payer: BC Managed Care – PPO | Attending: Family Medicine | Admitting: Family Medicine

## 2019-07-17 ENCOUNTER — Encounter (INDEPENDENT_AMBULATORY_CARE_PROVIDER_SITE_OTHER): Payer: Self-pay | Admitting: Family Medicine

## 2019-07-17 ENCOUNTER — Ambulatory Visit (INDEPENDENT_AMBULATORY_CARE_PROVIDER_SITE_OTHER): Payer: BC Managed Care – PPO

## 2019-07-17 ENCOUNTER — Other Ambulatory Visit: Payer: Self-pay

## 2019-07-17 ENCOUNTER — Other Ambulatory Visit (INDEPENDENT_AMBULATORY_CARE_PROVIDER_SITE_OTHER): Payer: Self-pay | Admitting: Family Medicine

## 2019-07-17 ENCOUNTER — Ambulatory Visit (INDEPENDENT_AMBULATORY_CARE_PROVIDER_SITE_OTHER): Payer: BC Managed Care – PPO | Admitting: Family Medicine

## 2019-07-17 VITALS — BP 130/82 | HR 47 | Temp 97.8°F | Resp 18 | Ht 71.0 in | Wt 244.2 lb

## 2019-07-17 DIAGNOSIS — I10 Essential (primary) hypertension: Secondary | ICD-10-CM

## 2019-07-17 DIAGNOSIS — E782 Mixed hyperlipidemia: Secondary | ICD-10-CM

## 2019-07-17 DIAGNOSIS — E119 Type 2 diabetes mellitus without complications: Secondary | ICD-10-CM

## 2019-07-17 DIAGNOSIS — K219 Gastro-esophageal reflux disease without esophagitis: Secondary | ICD-10-CM

## 2019-07-17 DIAGNOSIS — Z23 Encounter for immunization: Secondary | ICD-10-CM

## 2019-07-17 DIAGNOSIS — Z6834 Body mass index (BMI) 34.0-34.9, adult: Secondary | ICD-10-CM

## 2019-07-17 LAB — CBC
HCT: 48.2 % (ref 38.9–50.5)
HGB: 16.4 g/dL (ref 13.4–17.3)
MCH: 32.8 pg (ref 27.9–33.1)
MCHC: 34 g/dL (ref 32.8–36.0)
MCV: 96.7 fL — ABNORMAL HIGH (ref 82.4–95.0)
MPV: 8.2 fL (ref 6.0–10.2)
PLATELETS: 201 10*3/uL (ref 140–440)
RBC: 4.98 10*6/uL (ref 4.40–5.68)
RDW: 13.4 % (ref 10.9–15.1)
WBC: 7 10*3/uL (ref 3.3–9.3)

## 2019-07-17 LAB — COMPREHENSIVE METABOLIC PNL, FASTING
ALBUMIN: 4.4 g/dL (ref 3.2–4.6)
ALKALINE PHOSPHATASE: 72 U/L (ref 20–130)
ALT (SGPT): 87 U/L — ABNORMAL HIGH (ref <=52)
ANION GAP: 10 mmol/L
AST (SGOT): 106 U/L — ABNORMAL HIGH (ref <=35)
BILIRUBIN TOTAL: 1.1 mg/dL (ref 0.3–1.2)
BUN/CREA RATIO: 8
BUN: 8 mg/dL — ABNORMAL LOW (ref 10–25)
CALCIUM: 9.8 mg/dL (ref 8.8–10.3)
CHLORIDE: 97 mmol/L — ABNORMAL LOW (ref 98–111)
CO2 TOTAL: 27 mmol/L (ref 21–35)
CREATININE: 1.01 mg/dL (ref <=1.30)
ESTIMATED GFR: >60 mL/min/{1.73_m2}
GLUCOSE: 155 mg/dL — ABNORMAL HIGH (ref 70–110)
POTASSIUM: 4.6 mmol/L (ref 3.5–5.0)
PROTEIN TOTAL: 7.3 g/dL (ref 6.0–8.3)
SODIUM: 134 mmol/L — ABNORMAL LOW (ref 135–145)

## 2019-07-17 LAB — LIPID PANEL
CHOLESTEROL: 251 mg/dL — ABNORMAL HIGH (ref 0–199)
HDL CHOL: 53 mg/dL (ref >40)
LDL DIRECT: 197 mg/dL — ABNORMAL HIGH (ref 0–99)
TRIGLYCERIDES: 94 mg/dL (ref 0–199)
VLDL CALC: 19 mg/dL (ref 0–50)

## 2019-07-17 LAB — HGA1C (HEMOGLOBIN A1C WITH EST AVG GLUCOSE)
ESTIMATED AVERAGE GLUCOSE: 123 mg/dL
HEMOGLOBIN A1C: 5.9 % — ABNORMAL HIGH (ref 4.1–5.7)

## 2019-07-17 MED ORDER — FAMOTIDINE 40 MG TABLET
40.00 mg | ORAL_TABLET | Freq: Two times a day (BID) | ORAL | 3 refills | Status: DC
Start: 2019-07-17 — End: 2019-10-18

## 2019-07-17 NOTE — Progress Notes (Signed)
[facility]  [address] [place] [address]  Dept Phone: [phone]  Dept Fax: [phone]    Danny [name]  05-31-1961  [id number]    Date of Service: 07/17/2019   Chief complaint:   Chief Complaint   Patient presents with   . Follow Up 3 Months     HTN, Hyperlipidemia, DMII, GERD   . Blood Work     pt is fasting.    . Esophageal Reflux     pt states his stomach has been burning really bad for 2-3 weeks. takes prevacid daily.        Subjective:   Patient presents today for routine 3 month followup to discuss his chronic medical problems which include his hypertension, hyperlipidemia, diabetes, and GERD.  Patient states he has been doing fairly well he has been having more issues with his reflux.  He has had an upper endoscopy done within the last year.  Patient denies any other issues or concerns today.  As far as his blood sugars he states they have been doing well.  As far as his blood pressure it has also been good.  He denies chest pain or shortness of breath.    Current Outpatient Medications   Medication Sig   . amLODIPine (NORVASC) 5 mg Oral Tablet TAKE 1 TAB (5 MG TOTAL) BY MOUTH ONCE A DAY   . aspirin 81 mg Oral Tablet, Chewable Take 1 Tab (81 mg total) by mouth Once a day   . atorvastatin (LIPITOR) 80 mg Oral Tablet TAKE 1 TAB (80 MG TOTAL) BY MOUTH EVERY NIGHT   . Blood Sugar Diagnostic (ONETOUCH VERIO) Strip 1 Strip by Does not apply route Twice daily   . cholecalciferol, vitamin D3, 1,000 unit Oral Tablet Take 1,000 Units by mouth Once a day   . colesevelam [facility]) 625 mg Oral Tablet Take 3 Tabs (1,875 mg total) by mouth Twice daily with food   . cyanocobalamin (VITAMIN B 12) 1,000 mcg Oral Tablet Take 1,000 mcg by mouth Once a day   . diazePAM (VALIUM) 5 mg Oral Tablet Take 1 Tab (5 mg total) by mouth Every 12 hours as needed for Anxiety   . diphenhydrAMINE (BENADRYL) 25 mg Oral Capsule Take 25 mg by mouth Every 6 hours as needed   . EPINEPHrine (EPIPEN) 0.3  mg/0.3 mL Injection Auto-Injector 0.3 mL (0.3 mg total) by Intramuscular route Once, as needed for up to 1 dose   . famotidine (PEPCID) 40 mg Oral Tablet Take 1 Tab (40 mg total) by mouth Twice daily   . FLUoxetine (PROZAC) 40 mg Oral Capsule Take 1 Cap (40 mg total) by mouth Once a day HVL   . fluticasone (FLONASE) 50 mcg/actuation Nasal Spray, Suspension 1 Spray by Each Nostril route Twice daily   . Lancets Misc 1 Stick by Does not apply route Twice daily   . lansoprazole (PREVACID) 30 mg Oral Capsule, Delayed Release(E.C.) TAKE 1 CAPSULE BY MOUTH EVERY DAY   . loratadine (CLARITIN) 10 mg Oral Tablet Take 10 mg by mouth Once a day   . losartan (COZAAR) 50 mg Oral Tablet TAKE 1 TAB (50 MG TOTAL) BY MOUTH ONCE A DAY   . metoprolol succinate (TOPROL-XL) 25 mg Oral Tablet Sustained Release 24 hr TAKE 1 TABLET BY MOUTH EVERY DAY   . naphazoline-pheniramine (OPCON-A) 0.02675-0.315 % Ophthalmic Drops Instill 1 Drop into both eyes Once per day as needed   . olmesartan (BENICAR) 40 mg Oral  Tablet Take 1 Tab (40 mg total) by mouth Once a day   . ondansetron (ZOFRAN) 4 mg Oral Tablet Take 1 Tab (4 mg total) by mouth Every 8 hours as needed for nausea/vomiting   . PROAIR HFA 90 mcg/actuation Inhalation HFA Aerosol Inhaler Take 1-2 Puffs by inhalation Every 6 hours as needed   . triamcinolone acetonide (ARISTOCORT A) 0.1 % Cream Apply topically Twice daily       Objective:   BP 130/82   Pulse 47   Temp 36.6 C (97.8 F) (Tympanic)   Resp 18   Ht 1.803 m (5\' 11" )   Wt 111 kg (244 lb 3.2 oz)   SpO2 98%   BMI 34.06 kg/m       General appearance: alert, oriented x 3, in his normal state, cooperative, not in apparent distress, appearing stated age   HEENT:  Eyes:  Pupils equal round react like accommodation extraocular muscles intact. Ears within normal limits throat clear, tonsils normal, no cervical lymphadenopathy thyroid normal  Lungs: clear to auscultation bilaterally, respirations non-labored  Heart: regular rate and  rhythm, S1, S2 normal, no murmur, PMI non-diffuse  Abdomen: soft, non-tender. Bowel sounds normal.  Extremities: extremities normal, atraumatic, no cyanosis or edema, pulses intact in upper and lower extremities    Assessment     ENCOUNTER DIAGNOSES     ICD-10-CM   1. Need for pneumococcal vaccination Z23   2. Essential hypertension I10   3. Mixed hyperlipidemia E78.2   4. Type 2 diabetes mellitus without complication, without long-term current use of insulin (CMS HCC) E11.9   5. Gastroesophageal reflux disease, esophagitis presence not specified K21.9       Plan     Orders Placed This Encounter   . Pneumovax Administration   . CBC   . COMPREHENSIVE METABOLIC PNL, FASTING   . HGA1C (HEMOGLOBIN A1C WITH EST AVG GLUCOSE)   . LIPID PANEL   . famotidine (PEPCID) 40 mg Oral Tablet             Patient is to continue on all current medications other than those changed in the orders.     1. Type 2 diabetes:  Hemoglobin A1c is ordered and will call patient with results.  2. Hypertension:  Currently appears to be well managed  3. Hyperlipidemia:  Will check labs and call patient with results  4. GERD:  At this point I am going to add Pepcid to his regimen.  If his symptoms do not improve would consider her referral for surgical evaluation.      Nelda Severe Mel Almond, M.D.

## 2019-07-24 ENCOUNTER — Telehealth (HOSPITAL_BASED_OUTPATIENT_CLINIC_OR_DEPARTMENT_OTHER): Payer: Self-pay | Admitting: Surgery

## 2019-07-24 NOTE — Telephone Encounter (Signed)
Attempted to call pt with results of testing.  Left message for pt to return call at their convenience to discuss results.

## 2019-07-24 NOTE — Telephone Encounter (Addendum)
Called patient with EGD & colonoscopy pathology results.  Advised on anti reflux lifestyle (smoking cessation, minimal ETOH intake, limited NSAIDs & no eating 3 hrs prior to bed)  Continue antacid through PCP.  F/U here PRN for EGD  Recall generated to repeat colonoscopy in 2 yrs      ----- Message from Marguerita Beards, MD sent at 07/20/2019 10:15 AM EDT -----  Please call patient with results.  Mild inflammation of esophagus.  Benign polyps of colon.  Given type of polyp, would recommend follow-up colonoscopy in 2 years.

## 2019-07-25 ENCOUNTER — Other Ambulatory Visit (INDEPENDENT_AMBULATORY_CARE_PROVIDER_SITE_OTHER): Payer: Self-pay | Admitting: Family Medicine

## 2019-08-14 ENCOUNTER — Other Ambulatory Visit (INDEPENDENT_AMBULATORY_CARE_PROVIDER_SITE_OTHER): Payer: Self-pay | Admitting: Family Medicine

## 2019-08-14 MED ORDER — DIAZEPAM 5 MG TABLET
5.00 mg | ORAL_TABLET | Freq: Two times a day (BID) | ORAL | 1 refills | Status: DC | PRN
Start: 2019-08-14 — End: 2019-09-19

## 2019-09-19 ENCOUNTER — Other Ambulatory Visit (INDEPENDENT_AMBULATORY_CARE_PROVIDER_SITE_OTHER): Payer: Self-pay | Admitting: Family Medicine

## 2019-09-19 MED ORDER — DIAZEPAM 5 MG TABLET
5.00 mg | ORAL_TABLET | Freq: Two times a day (BID) | ORAL | 1 refills | Status: DC | PRN
Start: 2019-09-19 — End: 2020-12-10

## 2019-09-19 MED ORDER — GLIPIZIDE 10 MG TABLET
10.00 mg | ORAL_TABLET | Freq: Every morning | ORAL | 1 refills | Status: AC
Start: 2019-09-19 — End: ?

## 2019-10-06 ENCOUNTER — Other Ambulatory Visit (INDEPENDENT_AMBULATORY_CARE_PROVIDER_SITE_OTHER): Payer: Self-pay | Admitting: Family Medicine

## 2019-10-18 ENCOUNTER — Other Ambulatory Visit: Payer: Self-pay

## 2019-10-18 ENCOUNTER — Encounter (INDEPENDENT_AMBULATORY_CARE_PROVIDER_SITE_OTHER): Payer: Self-pay | Admitting: Family Medicine

## 2019-10-18 ENCOUNTER — Ambulatory Visit (INDEPENDENT_AMBULATORY_CARE_PROVIDER_SITE_OTHER): Payer: BC Managed Care – PPO | Admitting: Family Medicine

## 2019-10-18 VITALS — BP 140/82 | HR 80 | Resp 18 | Ht 71.0 in | Wt 240.0 lb

## 2019-10-18 DIAGNOSIS — Z23 Encounter for immunization: Secondary | ICD-10-CM

## 2019-10-18 DIAGNOSIS — I10 Essential (primary) hypertension: Secondary | ICD-10-CM

## 2019-10-18 DIAGNOSIS — E119 Type 2 diabetes mellitus without complications: Secondary | ICD-10-CM

## 2019-10-18 DIAGNOSIS — E782 Mixed hyperlipidemia: Secondary | ICD-10-CM

## 2019-10-18 NOTE — Progress Notes (Signed)
Danny [name]  [address] [place] [address]  Dept Phone: [phone]  Dept Fax: [phone]    Danny [name]  31-Oct-1961  [id number]    Date of Service: 10/18/2019   Chief complaint:   Chief Complaint   Patient presents with   . Follow Up 3 Months     HTN , Hyperlipidemia , Gerd , DM II    . Blood Work     pt isnt fasting        Subjective:   Patient presents today for routine follow-up of his chronic medical problems which include his hypertension, hyperlipidemia, diabetes, and reflux.  Patient overall states he is doing well.  He has no significant complaints problems or issues today.  At the last visit his lipids were elevated in the patient states that he was not taking his medication regularly he has been over the last several months.  He denies chest pain or shortness of breath.  As far as his blood sugars he states they have been doing fairly well.  No side effects from medications.    Current Outpatient Medications   Medication Sig   . amLODIPine (NORVASC) 5 mg Oral Tablet TAKE 1 TAB (5 MG TOTAL) BY MOUTH ONCE A DAY   . aspirin 81 mg Oral Tablet, Chewable Take 1 Tab (81 mg total) by mouth Once a day   . atorvastatin (LIPITOR) 80 mg Oral Tablet TAKE 1 TAB (80 MG TOTAL) BY MOUTH EVERY NIGHT   . Blood Sugar Diagnostic (ONETOUCH VERIO) Strip 1 Strip by Does not apply route Twice daily   . cholecalciferol, vitamin D3, 1,000 unit Oral Tablet Take 1,000 Units by mouth Once a day   . colesevelam [facility]) 625 mg Oral Tablet Take 3 Tabs (1,875 mg total) by mouth Twice daily with food   . cyanocobalamin (VITAMIN B 12) 1,000 mcg Oral Tablet Take 1,000 mcg by mouth Once a day   . diazePAM (VALIUM) 5 mg Oral Tablet Take 1 Tab (5 mg total) by mouth Every 12 hours as needed for Anxiety   . diphenhydrAMINE (BENADRYL) 25 mg Oral Capsule Take 25 mg by mouth Every 6 hours as needed   . EPINEPHrine (EPIPEN) 0.3 mg/0.3 mL Injection Auto-Injector 0.3 mL (0.3 mg total) by Intramuscular route  Once, as needed for up to 1 dose   . FLUoxetine (PROZAC) 40 mg Oral Capsule Take 1 Cap (40 mg total) by mouth Once a day HVL   . fluticasone (FLONASE) 50 mcg/actuation Nasal Spray, Suspension 1 Spray by Each Nostril route Twice daily   . glipiZIDE (GLUCOTROL) 10 mg Oral Tablet Take 1 Tab (10 mg total) by mouth Every morning before breakfast Take 30 minutes before meals   . Lancets Misc 1 Stick by Does not apply route Twice daily   . lansoprazole (PREVACID) 30 mg Oral Capsule, Delayed Release(E.C.) TAKE 1 CAPSULE BY MOUTH EVERY DAY   . loratadine (CLARITIN) 10 mg Oral Tablet Take 10 mg by mouth Once a day   . losartan (COZAAR) 50 mg Oral Tablet TAKE 1 TAB (50 MG TOTAL) BY MOUTH ONCE A DAY   . metoprolol succinate (TOPROL-XL) 25 mg Oral Tablet Sustained Release 24 hr TAKE 1 TABLET BY MOUTH EVERY DAY   . naphazoline-pheniramine (OPCON-A) 0.02675-0.315 % Ophthalmic Drops Instill 1 Drop into both eyes Once per day as needed   . ondansetron (ZOFRAN) 4 mg Oral Tablet Take 1 Tab (4 mg total) by mouth Every 8 hours as  needed for nausea/vomiting   . PROAIR HFA 90 mcg/actuation Inhalation HFA Aerosol Inhaler Take 1-2 Puffs by inhalation Every 6 hours as needed   . triamcinolone acetonide (ARISTOCORT A) 0.1 % Cream Apply topically Twice daily       Objective:   BP 140/82   Pulse 80   Resp 18   Ht 1.803 m (5\' 11" )   Wt 109 kg (240 lb)   SpO2 96%   BMI 33.47 kg/m       General appearance: alert, oriented x 3, in his normal state, cooperative, not in apparent distress, appearing stated age   HEENT:  Eyes:  Pupils equal round react like accommodation extraocular muscles intact. Ears within normal limits throat clear, tonsils normal, no cervical lymphadenopathy thyroid normal  Lungs: clear to auscultation bilaterally, respirations non-labored  Heart: regular rate and rhythm, S1, S2 normal, no murmur, PMI non-diffuse  Abdomen: soft, non-tender. Bowel sounds normal.  Extremities: extremities normal, atraumatic, no cyanosis or  edema, pulses intact in upper and lower extremities    Assessment     ENCOUNTER DIAGNOSES     ICD-10-CM   1. Essential hypertension  I10   2. Mixed hyperlipidemia  E78.2   3. Type 2 diabetes mellitus without complication, without long-term current use of insulin (CMS HCC)  E11.9       Plan     Orders Placed This Encounter   . Influenza Vaccine IM Age 80mo through Adult 0.5ML(admin)   . LIPID PANEL   . HEPATIC FUNCTION PANEL   . HGA1C (HEMOGLOBIN A1C WITH EST AVG GLUCOSE)             Patient is to continue on all current medications other than those changed in the orders.     1. Hypertension:  Currently appears to be well managed  2. Type 2 diabetes:  Will check labs and call patient with results continue on current medications and will adjust if needed  3. Hyperlipidemia:  His lipids did go up at the last visit patient states he has been taking his medication will review and call patient with results.    Nelda Severe Mel Almond, M.D.

## 2019-10-23 ENCOUNTER — Other Ambulatory Visit (INDEPENDENT_AMBULATORY_CARE_PROVIDER_SITE_OTHER): Payer: Self-pay | Admitting: Family Medicine

## 2019-12-05 ENCOUNTER — Other Ambulatory Visit (INDEPENDENT_AMBULATORY_CARE_PROVIDER_SITE_OTHER): Payer: Self-pay | Admitting: Family Medicine

## 2020-01-06 ENCOUNTER — Other Ambulatory Visit (INDEPENDENT_AMBULATORY_CARE_PROVIDER_SITE_OTHER): Payer: Self-pay | Admitting: Family Medicine

## 2020-01-15 ENCOUNTER — Other Ambulatory Visit: Payer: Self-pay

## 2020-01-15 ENCOUNTER — Ambulatory Visit (INDEPENDENT_AMBULATORY_CARE_PROVIDER_SITE_OTHER): Payer: BC Managed Care – PPO | Admitting: Family Medicine

## 2020-01-15 ENCOUNTER — Other Ambulatory Visit (INDEPENDENT_AMBULATORY_CARE_PROVIDER_SITE_OTHER): Payer: BC Managed Care – PPO

## 2020-01-15 ENCOUNTER — Encounter (INDEPENDENT_AMBULATORY_CARE_PROVIDER_SITE_OTHER): Payer: Self-pay | Admitting: Family Medicine

## 2020-01-15 VITALS — BP 140/80 | HR 94 | Resp 16 | Ht 71.0 in | Wt 244.0 lb

## 2020-01-15 DIAGNOSIS — F419 Anxiety disorder, unspecified: Secondary | ICD-10-CM

## 2020-01-15 DIAGNOSIS — I10 Essential (primary) hypertension: Secondary | ICD-10-CM

## 2020-01-15 DIAGNOSIS — F32A Depression, unspecified: Secondary | ICD-10-CM

## 2020-01-15 DIAGNOSIS — E119 Type 2 diabetes mellitus without complications: Secondary | ICD-10-CM

## 2020-01-15 DIAGNOSIS — Z125 Encounter for screening for malignant neoplasm of prostate: Secondary | ICD-10-CM

## 2020-01-15 DIAGNOSIS — E782 Mixed hyperlipidemia: Secondary | ICD-10-CM

## 2020-01-15 DIAGNOSIS — Z6834 Body mass index (BMI) 34.0-34.9, adult: Secondary | ICD-10-CM

## 2020-01-15 DIAGNOSIS — F329 Major depressive disorder, single episode, unspecified: Secondary | ICD-10-CM

## 2020-01-15 NOTE — Progress Notes (Signed)
[place]  [address] [place] [address]  Dept Phone: [phone]  Dept Fax: [phone]    Sonya Gim  06-24-1961  [id number]    Date of Service: 01/15/2020   Chief complaint:   Chief Complaint   Patient presents with   . Follow Up 3 Months     hypertension, hyperlipidemia, diabetes, and reflux.   . Blood Work     pt isnt fasting        Subjective:   Patient presents today for routine 3 month followup to discuss his chronic medical problems which include his anxiety depression, hypertension, hyperlipidemia, and diabetes.  Patient states overall he is doing fairly well his stress and anxiety is pretty high.  He has had a long history of psychiatric issues.  He states that he had a psychiatrist who retired he would like a referral to another psychiatrist.  He denies any suicidal thoughts.  As far as his blood pressure is mildly elevated today he states he does check it occasionally normally it is better than this no headache dizziness or chest pain.    Current Outpatient Medications   Medication Sig   . amLODIPine (NORVASC) 5 mg Oral Tablet TAKE 1 TABLET BY MOUTH EVERY DAY   . aspirin 81 mg Oral Tablet, Chewable Take 1 Tab (81 mg total) by mouth Once a day   . atorvastatin (LIPITOR) 80 mg Oral Tablet TAKE 1 TAB (80 MG TOTAL) BY MOUTH EVERY NIGHT   . Blood Sugar Diagnostic (ONETOUCH VERIO) Strip 1 Strip by Does not apply route Twice daily   . cholecalciferol, vitamin D3, 1,000 unit Oral Tablet Take 1,000 Units by mouth Once a day   . colesevelam (WELCHOL) 625 mg Oral Tablet TAKE 3 TABLETS BY MOUTH TWICE A DAY WITH FOOD   . cyanocobalamin (VITAMIN B 12) 1,000 mcg Oral Tablet Take 1,000 mcg by mouth Once a day   . diazePAM (VALIUM) 5 mg Oral Tablet Take 1 Tab (5 mg total) by mouth Every 12 hours as needed for Anxiety   . diphenhydrAMINE (BENADRYL) 25 mg Oral Capsule Take 25 mg by mouth Every 6 hours as needed   . EPINEPHrine (EPIPEN) 0.3 mg/0.3 mL Injection Auto-Injector 0.3 mL  (0.3 mg total) by Intramuscular route Once, as needed for up to 1 dose   . FLUoxetine (PROZAC) 40 mg Oral Capsule Take 1 Cap (40 mg total) by mouth Once a day HVL   . fluticasone (FLONASE) 50 mcg/actuation Nasal Spray, Suspension 1 Spray by Each Nostril route Twice daily   . glipiZIDE (GLUCOTROL) 10 mg Oral Tablet Take 1 Tab (10 mg total) by mouth Every morning before breakfast Take 30 minutes before meals   . Lancets Misc 1 Stick by Does not apply route Twice daily   . lansoprazole (PREVACID) 30 mg Oral Capsule, Delayed Release(E.C.) TAKE 1 CAPSULE BY MOUTH EVERY DAY   . loratadine (CLARITIN) 10 mg Oral Tablet Take 10 mg by mouth Once a day   . losartan (COZAAR) 50 mg Oral Tablet TAKE 1 TAB (50 MG TOTAL) BY MOUTH ONCE A DAY   . metoprolol succinate (TOPROL-XL) 25 mg Oral Tablet Sustained Release 24 hr TAKE 1 TABLET BY MOUTH EVERY DAY   . naphazoline-pheniramine (OPCON-A) 0.02675-0.315 % Ophthalmic Drops Instill 1 Drop into both eyes Once per day as needed   . ondansetron (ZOFRAN) 4 mg Oral Tablet Take 1 Tab (4 mg total) by mouth Every 8 hours as needed for nausea/vomiting   .  PROAIR HFA 90 mcg/actuation Inhalation HFA Aerosol Inhaler Take 1-2 Puffs by inhalation Every 6 hours as needed   . triamcinolone acetonide (ARISTOCORT A) 0.1 % Cream Apply topically Twice daily       Objective:   BP (!) 140/80   Pulse 94   Resp 16   Ht 1.803 m (5\' 11" )   Wt 111 kg (244 lb)   SpO2 98%   BMI 34.03 kg/m       General appearance: alert, oriented x 3, in his normal state, cooperative, not in apparent distress, appearing stated age   HEENT:  Eyes:  Pupils equal round react like accommodation extraocular muscles intact. Ears within normal limits throat clear, tonsils normal, no cervical lymphadenopathy thyroid normal  Lungs: clear to auscultation bilaterally, respirations non-labored  Heart: regular rate and rhythm, S1, S2 normal, no murmur, PMI non-diffuse  Abdomen: soft, non-tender. Bowel sounds normal.  Extremities:  extremities normal, atraumatic, no cyanosis or edema, pulses intact in upper and lower extremities    Assessment     ENCOUNTER DIAGNOSES     ICD-10-CM   1. Essential hypertension  I10   2. Mixed hyperlipidemia  E78.2   3. Type 2 diabetes mellitus without complication, without long-term current use of insulin (CMS HCC)  E11.9   4. Screening for prostate cancer  Z12.5   5. Depression, unspecified depression type  F32.9   6. Anxiety  F41.9       Plan     Orders Placed This Encounter   . CBC   . COMPREHENSIVE METABOLIC PNL, FASTING   . HGA1C (HEMOGLOBIN A1C WITH EST AVG GLUCOSE)   . LIPID PANEL   . PSA SCREENING   . Refer to Behavioral Medicine Psychiatry - [place]             Patient is to continue on all current medications other than those changed in the orders.       1. Hypertension:  Blood pressure is mildly elevated today I would like the patient start checking on a regular basis write the numbers down let me know if his blood pressure is not well controlled.  2. Type 2 diabetes:  Sounds as if his blood sugars are not under great control.  Patient is nonfasting today but will come back in the next few days get his blood work done and will discuss possibly changing medication or adding medication of does stay.  3. Depression anxiety:  Going to refer to Psych[facility] [place].      Nelda Severe Mel Almond, M.D.

## 2020-01-19 ENCOUNTER — Other Ambulatory Visit (INDEPENDENT_AMBULATORY_CARE_PROVIDER_SITE_OTHER): Payer: Self-pay | Admitting: Family Medicine

## 2020-01-19 NOTE — Telephone Encounter (Signed)
Per Dr Mel Almond request script called in - [place], LPN  [phone], [phone]

## 2020-02-07 ENCOUNTER — Other Ambulatory Visit: Payer: Self-pay

## 2020-02-07 ENCOUNTER — Ambulatory Visit (INDEPENDENT_AMBULATORY_CARE_PROVIDER_SITE_OTHER): Payer: BC Managed Care – PPO | Admitting: Family Medicine

## 2020-02-07 ENCOUNTER — Encounter (INDEPENDENT_AMBULATORY_CARE_PROVIDER_SITE_OTHER): Payer: Self-pay | Admitting: Family Medicine

## 2020-02-07 VITALS — BP 130/64 | HR 72 | Temp 98.1°F | Resp 18 | Ht 71.0 in | Wt 243.0 lb

## 2020-02-07 DIAGNOSIS — J40 Bronchitis, not specified as acute or chronic: Secondary | ICD-10-CM

## 2020-02-07 MED ORDER — METHYLPREDNISOLONE 4 MG TABLETS IN A DOSE PACK
ORAL_TABLET | ORAL | 0 refills | Status: DC
Start: 2020-02-07 — End: 2020-04-29

## 2020-02-07 MED ORDER — LEVOFLOXACIN 500 MG TABLET
500.0000 mg | ORAL_TABLET | Freq: Every day | ORAL | 0 refills | Status: DC
Start: 2020-02-07 — End: 2020-04-29

## 2020-02-07 NOTE — Progress Notes (Signed)
[place]  [address] [place] [address]  Dept Phone: [phone]  Dept Fax: [phone]    Corran Persell  01-Aug-1961  [id number]    Date of Service: 02/07/2020   Chief complaint:   Chief Complaint   Patient presents with   . Cough     pt has been taking levaquin  was throwing up on monday    . Low Blood Sugar     blood sugar was 60 on  mon tuesday   . Ankle swelling     ankles are swelling        Subjective:   Patient presents today for an acute visit due to cough and congestion.  Patient denies any fever or chills no shortness of breath.  Patient had a couple episodes of vomiting but states this has resolved.  He vomits quite frequently.  Is been on antibiotic for the last 2 days that was a leftover antibiotic.    Current Outpatient Medications   Medication Sig   . amLODIPine (NORVASC) 5 mg Oral Tablet TAKE 1 TABLET BY MOUTH EVERY DAY   . aspirin 81 mg Oral Tablet, Chewable Take 1 Tab (81 mg total) by mouth Once a day   . atorvastatin (LIPITOR) 80 mg Oral Tablet TAKE 1 TAB (80 MG TOTAL) BY MOUTH EVERY NIGHT   . Blood Sugar Diagnostic (ONETOUCH VERIO) Strip 1 Strip by Does not apply route Twice daily   . cholecalciferol, vitamin D3, 1,000 unit Oral Tablet Take 1,000 Units by mouth Once a day   . colesevelam (WELCHOL) 625 mg Oral Tablet TAKE 3 TABLETS BY MOUTH TWICE A DAY WITH FOOD   . cyanocobalamin (VITAMIN B 12) 1,000 mcg Oral Tablet Take 1,000 mcg by mouth Once a day   . diazePAM (VALIUM) 5 mg Oral Tablet Take 1 Tab (5 mg total) by mouth Every 12 hours as needed for Anxiety   . diphenhydrAMINE (BENADRYL) 25 mg Oral Capsule Take 25 mg by mouth Every 6 hours as needed   . EPINEPHrine (EPIPEN) 0.3 mg/0.3 mL Injection Auto-Injector 0.3 mL (0.3 mg total) by Intramuscular route Once, as needed for up to 1 dose   . FLUoxetine (PROZAC) 40 mg Oral Capsule TAKE 1 CAPSULE BY MOUTH EVERY DAY   . fluticasone (FLONASE) 50 mcg/actuation Nasal Spray, Suspension 1 Spray by Each Nostril route  Twice daily   . glipiZIDE (GLUCOTROL) 10 mg Oral Tablet Take 1 Tab (10 mg total) by mouth Every morning before breakfast Take 30 minutes before meals   . Lancets Misc 1 Stick by Does not apply route Twice daily   . lansoprazole (PREVACID) 30 mg Oral Capsule, Delayed Release(E.C.) TAKE 1 CAPSULE BY MOUTH EVERY DAY   . levoFLOXacin (LEVAQUIN) 500 mg Oral Tablet Take 1 Tablet (500 mg total) by mouth Once a day   . loratadine (CLARITIN) 10 mg Oral Tablet Take 10 mg by mouth Once a day   . losartan (COZAAR) 50 mg Oral Tablet TAKE 1 TAB (50 MG TOTAL) BY MOUTH ONCE A DAY   . Methylprednisolone (MEDROL DOSEPACK) 4 mg Oral Tablets, Dose Pack Take as instructed.   . metoprolol succinate (TOPROL-XL) 25 mg Oral Tablet Sustained Release 24 hr TAKE 1 TABLET BY MOUTH EVERY DAY   . naphazoline-pheniramine (OPCON-A) 0.02675-0.315 % Ophthalmic Drops Instill 1 Drop into both eyes Once per day as needed   . ondansetron (ZOFRAN) 4 mg Oral Tablet Take 1 Tab (4 mg total) by mouth Every 8  hours as needed for nausea/vomiting   . PROAIR HFA 90 mcg/actuation Inhalation HFA Aerosol Inhaler Take 1-2 Puffs by inhalation Every 6 hours as needed   . triamcinolone acetonide (ARISTOCORT A) 0.1 % Cream Apply topically Twice daily       Objective:   BP 130/64   Pulse 72   Temp 36.7 C (98.1 F)   Resp 18   Ht 1.803 m (5\' 11" )   Wt 110 kg (243 lb)   SpO2 96%   BMI 33.89 kg/m       General appearance: alert, oriented x 3, in his normal state, cooperative, not in apparent distress, appearing stated age   HEENT:  Eyes:  Pupils equal round react like accommodation extraocular muscles intact. Ears within normal limits throat clear, tonsils normal, no cervical lymphadenopathy thyroid normal  Lungs: clear to auscultation bilaterally, respirations non-labored  Heart: regular rate and rhythm, S1, S2 normal, no murmur, PMI non-diffuse  Abdomen: soft, non-tender. Bowel sounds normal.      Assessment     ENCOUNTER DIAGNOSES     ICD-10-CM   1. Bronchitis   J40       Plan     Orders Placed This Encounter   . levoFLOXacin (LEVAQUIN) 500 mg Oral Tablet   . Methylprednisolone (MEDROL DOSEPACK) 4 mg Oral Tablets, Dose Pack             Patient is to continue on all current medications other than those changed in the orders.     Will go ahead and continue on the antibiotic Levaquin 500 once a day for 10 days Medrol Dosepak.  If his symptoms worsen he is to call.    Nelda Severe Mel Almond, M.D.

## 2020-02-15 ENCOUNTER — Telehealth (INDEPENDENT_AMBULATORY_CARE_PROVIDER_SITE_OTHER): Payer: Self-pay | Admitting: Family Medicine

## 2020-02-15 NOTE — Telephone Encounter (Signed)
[name] called and said she is having issues getting Danny [name]'s blood sugars stable. She said they keep dropping and was down to 45. She would like for Dr Mel Almond to call her. Lincoln Brigham, LPN  [id number], [id number]

## 2020-02-15 NOTE — Telephone Encounter (Signed)
I call the patient but there was no answer left a voicemail.  Asked patient to call back tomorrow.

## 2020-02-29 ENCOUNTER — Ambulatory Visit (VACCINATION_CLINIC): Payer: BC Managed Care – PPO

## 2020-03-01 ENCOUNTER — Other Ambulatory Visit: Payer: Self-pay

## 2020-03-01 ENCOUNTER — Ambulatory Visit (INDEPENDENT_AMBULATORY_CARE_PROVIDER_SITE_OTHER): Payer: BC Managed Care – PPO

## 2020-03-01 ENCOUNTER — Other Ambulatory Visit: Payer: BC Managed Care – PPO | Attending: Family Medicine | Admitting: Family Medicine

## 2020-03-01 DIAGNOSIS — E782 Mixed hyperlipidemia: Secondary | ICD-10-CM

## 2020-03-01 DIAGNOSIS — E119 Type 2 diabetes mellitus without complications: Secondary | ICD-10-CM | POA: Insufficient documentation

## 2020-03-01 DIAGNOSIS — Z125 Encounter for screening for malignant neoplasm of prostate: Secondary | ICD-10-CM | POA: Insufficient documentation

## 2020-03-01 DIAGNOSIS — I10 Essential (primary) hypertension: Secondary | ICD-10-CM | POA: Insufficient documentation

## 2020-03-01 LAB — COMPREHENSIVE METABOLIC PNL, FASTING
ALBUMIN: 4.2 g/dL (ref 3.2–4.6)
ALKALINE PHOSPHATASE: 77 U/L (ref 20–130)
ALT (SGPT): 120 U/L — ABNORMAL HIGH (ref <=52)
ANION GAP: 11 mmol/L
AST (SGOT): 106 U/L — ABNORMAL HIGH (ref <=35)
BILIRUBIN TOTAL: 0.9 mg/dL (ref 0.3–1.2)
BUN/CREA RATIO: 7
BUN: 6 mg/dL — ABNORMAL LOW (ref 10–25)
CALCIUM: 9 mg/dL (ref 8.8–10.3)
CHLORIDE: 100 mmol/L (ref 98–111)
CO2 TOTAL: 26 mmol/L (ref 21–35)
CREATININE: 0.81 mg/dL (ref <=1.30)
ESTIMATED GFR: >60 mL/min/{1.73_m2}
GLUCOSE: 147 mg/dL — ABNORMAL HIGH (ref 70–110)
POTASSIUM: 3.8 mmol/L (ref 3.5–5.0)
PROTEIN TOTAL: 7 g/dL (ref 6.0–8.3)
SODIUM: 137 mmol/L (ref 135–145)

## 2020-03-01 LAB — CBC
HCT: 46.8 % (ref 38.9–50.5)
HGB: 16 g/dL (ref 13.4–17.3)
MCH: 32.9 pg (ref 27.9–33.1)
MCHC: 34.2 g/dL (ref 32.8–36.0)
MCV: 96.2 fL — ABNORMAL HIGH (ref 82.4–95.0)
MPV: 8.8 fL (ref 6.0–10.2)
PLATELETS: 167 10*3/uL (ref 140–440)
RBC: 4.86 10*6/uL (ref 4.40–5.68)
RDW: 14.8 % (ref 10.9–15.1)
WBC: 6.5 10*3/uL (ref 3.3–9.3)

## 2020-03-01 LAB — LIPID PANEL
CHOLESTEROL: 205 mg/dL — ABNORMAL HIGH (ref 0–199)
HDL CHOL: 62 mg/dL (ref >40)
LDL DIRECT: 131 mg/dL — ABNORMAL HIGH (ref 0–99)
TRIGLYCERIDES: 73 mg/dL (ref 0–199)
VLDL CALC: 15 mg/dL (ref 0–50)

## 2020-03-01 LAB — HGA1C (HEMOGLOBIN A1C WITH EST AVG GLUCOSE)
ESTIMATED AVERAGE GLUCOSE: 128 mg/dL
HEMOGLOBIN A1C: 6.1 % — ABNORMAL HIGH (ref 4.1–5.7)

## 2020-03-01 LAB — PSA SCREENING: PSA: 0.3 ng/mL (ref 0.0–4.0)

## 2020-03-05 ENCOUNTER — Ambulatory Visit (HOSPITAL_COMMUNITY): Payer: BC Managed Care – PPO | Admitting: Student in an Organized Health Care Education/Training Program

## 2020-03-05 ENCOUNTER — Other Ambulatory Visit: Payer: Self-pay

## 2020-03-21 ENCOUNTER — Ambulatory Visit (VACCINATION_CLINIC): Payer: BC Managed Care – PPO

## 2020-04-10 ENCOUNTER — Telehealth: Payer: BC Managed Care – PPO | Admitting: Student in an Organized Health Care Education/Training Program

## 2020-04-10 ENCOUNTER — Other Ambulatory Visit: Payer: Self-pay

## 2020-04-10 DIAGNOSIS — F1020 Alcohol dependence, uncomplicated: Secondary | ICD-10-CM

## 2020-04-10 DIAGNOSIS — F419 Anxiety disorder, unspecified: Secondary | ICD-10-CM

## 2020-04-10 DIAGNOSIS — F32A Depression, unspecified: Secondary | ICD-10-CM

## 2020-04-10 DIAGNOSIS — F329 Major depressive disorder, single episode, unspecified: Secondary | ICD-10-CM

## 2020-04-10 MED ORDER — FLUOXETINE 20 MG CAPSULE
20.0000 mg | ORAL_CAPSULE | Freq: Every day | ORAL | 2 refills | Status: DC
Start: 2020-04-10 — End: 2020-07-16

## 2020-04-10 MED ORDER — FLUOXETINE 40 MG CAPSULE
40.0000 mg | ORAL_CAPSULE | Freq: Every day | ORAL | 2 refills | Status: DC
Start: 2020-04-10 — End: 2020-12-10

## 2020-04-10 NOTE — H&P (Signed)
[facility]  Department of Behavioral Medicine and Psychiatry  Outpatient HnP    TELEMEDICINE DOCUMENTATION:    Patient Location: Home  Patient/family aware of provider location: Yes  Patient/family consent for telemedicine:  Yes  Examination observed and performed by: Kathlene Cote, MD      Due to the COVID-19 health crisis, the patient was evaluated through telemedicine video interview. Prior to starting services, the patient's identity was verified. I personally offered the service and obtained patient's verbal consent to provide the service. The patient reported having the physical space for a private telemedicine session.  The importance of privacy at my and the patient's location was reviewed.     Patient name:  Danny [name]   Chart number:  [id number]  Date of birth:  1961-08-15  Date of service:  04/10/2020    Chief Complaint: Anxiety    ID:  Danny [name] is a 59 y.o. male from [place] [address]    History of Present Illness:  This patient is a 59 y.o. White male with a past psychiatric history of anxiety and depression, and a past medical history of HTN, HLD.     Patient states that he has had anxiety all his life. Worrying about everything. Having panic attacks with SOB, CP but then he went on Prozac in his early 17s and everything has been under control since then. Patient states that things started to go downhill again about 1.5 yrs ago. States a lot has happened in the past 1.23yrs with losing his parents, business being affected by COVID. States due to these stressors he feels as if his anxiety and depression has been getting worse.     Regarding mood, pt states he is feeling 'a little down'. Decreased quality of Sleep Decreased Interest/Energy/Concentration, + Guilt (about drinking) No Loss of Appetite.    States has started to feel anxious again all the time and thinks this has lead to his increased drinking. States before used to drink about 2 times a week and now drinking everyday 12  beers a day for past 1.5 yrs. States starts drinking in the morning and drinks throughout the day and if he goes a few hours without a drink he gets tremors. Denies any sz or DTs. States drinking is starting to affect his work as well and sometimes he does not even go in. Has never been through a medical detox, taken meds for alcohol use, gone to [facility] or been to rehab.     Regarding manic episodes/episodes of elevated or expansive mood accompanied by rapid or pressured speech,  racing thoughts, decreased need for sleep, distractibility, increase in goal-directed activities or risky behaviors pt denies any sx.     Regarding thought and perception, the patient pt denies any delusions, auditory, visual, tactile hallucinations, or paranoid thoughts.     Regarding traumatic events, the patient denies any hx. No Flashbacks/Nightmares/Hypervigilance/Avoidance.    Regarding current or past SI/thoughts of dying/HI, pt denies any current or past sx.     Substance Abuse  Alcohol : as stated above    Denies any other substance use.     Past Psychiatric History:   Previous outpatient provider: states last saw someone 6 yrs ago but now PCP refilling meds  Previous outpatient therapist: none  Previous inpatient psychiatric hospitalizations: none  Previous involuntary committments to a psychiatric unit: none  Previous number of suicide attempts: none    Current Psychiatric Medications:  Prozac 40    Past Psychiatric medication  Trials/ADRs:   Paxil    Past Medical History:   Past Medical History:   Diagnosis Date    Anxiety     Elevated lipids     GERD (gastroesophageal reflux disease)     Hypertension     Panic attack     Sleep apnea     no cpap    Vomiting     Wears glasses      Past Surgical History:  Past Surgical History:   Procedure Laterality Date    COLONOSCOPY  2015    [facility]    ESOPHAGOGASTRODUODENOSCOPY  2015    [facility]    GASTROSCOPY  07/28/2017    Dr. Jarome Lamas ELBOW SURGERY Left 2011    HX HAND  SURGERY Left 06/2013    incision and drainage    HX HAND SURGERY Left     incision and drainage    HX WISDOM TEETH EXTRACTION       Allergies:  Allergies   Allergen Reactions    Percocet [Oxycodone] Anaphylaxis     Home Medications:   Reviewed and confirmed with the patient as per EMR.     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   Current or previous disability: none  Legal history: denies  History of physical, emotional, verbal or sexual abuse: described above in the HPI     Review Of System:   General:  No fever or chills  Vision:  No vision changes  HENT:  No sinus congestion or sore throat  Cardiac:  No chest pain or palpitations  Respiratory:  No shortness of breath or cough  Abdomen:  No nausea, vomiting, or diarrhea   GU:  No dysuria or hematuria  Skin:  No rashes or bruises  Musculoskeletal:  No muscle or joint pain  Endocrine:  No diabetes or thyroid problems  Neuro:  No weakness or numbness  Psych: as stated in HPI    Physical Exam:   There were no vitals taken for this visit.  Physical exam: There were no visible lesions on exposed skin surfaces.  The patient's breathing was observed to be comfortable and normal. The patient is in no acute distress. All extremity movements appear intact with normal strength and no abnormalities. Gait is normal. There are no focal neurological deficits observed.     Mental Status Examination:  Orientation: Fully oriented to person, place, time and situation.  Danny [name]    Appearance:  Casually dressed.    Eye Contact:  good.    Behavior:  cooperative.    Attention:  good.    Speech:  normal rate and volume.    Motor:  no psychomotor retardation or agitation.    Mood:  "good".    Affect:  broad and normal range.    Thought Process:  linear.     Thought Content:  no paranoia or delusions.    Suicidal Ideation:  none.       Homicidal Ideation:  none  Perception:  no hallucinations endorsed  Cognition:  good abstract ability.   Memory:  Grossly intact  Fund of knowledge: appropriate    Insight:  fair.    Judgment:  Fair.    ASSESSMENT:This patient is a 59 y.o. White male with a past psychiatric history of anxiety and depression,  and a past medical history of HTN, HLD. Patient with increased stress over past 1.5 yrs with parents passing and stress of running a small business during [place]. This has caused his depression and anxiety to worsen that had been under control on Prozac 40 for 20+ yrs. Patient has also started drinking a lot - 12 beers a day every day for past 1.32yrs. Drinks throughout the day and notices tremors if goes several hours without drinking. Patient states he is interested in stopping drinking and coming inpatient for it. Long discussion had with patient explaining how coming off of alcohol can be dangerous and best done in medical setting. States will speak with wife and come to [facility] ED in the next few days for alcohol detox and further tx. In the mean time will increase Prozac to 60 mg.     Safety Screening:  A safety assessment was completed. Risk factors include: anxiety, substance misuse, recent death of friend or family and financial stress. At this time, the patient does not pose an acute risk above baseline. Protective factors include: Protective factors include: no thoughts of suicide, no thoughts of self-harm, no thoughts of harm toward others, no prior suicide attempts, no prior self-injury, supportive family, demonstration of help-seeking behavior and established outpatient care.    Psych Dx: Severe Alcohol Use Disorder, Unspecified Depressive Disorder, Unspecified Anxiety Disorder  Other Medical Dx: HTN, HLD    PLAN:    - Increase Prozac 40 to 60 mg Daily    Laboratory Studies: none today    Follow up:  RTC [facility] in [facility] via video.   Patient advised to call with any questions or concerns.    Patient advised to report to nearest emergency department or to call 911 if having any suicidal or homicidal ideations.      Kathlene Cote, MD 04/10/2020, 17:56  Behavioral Medicine & Psychiatry  PGY-3  Pager [phone]       Late entry for 04/10/2020. I reviewed the resident's note.  I agree with the findings and plan of care as documented in the resident's note.  Any exceptions/additions are edited/noted.    Daphene Calamity, MD  04/11/2020, 08:05

## 2020-04-23 ENCOUNTER — Other Ambulatory Visit (INDEPENDENT_AMBULATORY_CARE_PROVIDER_SITE_OTHER): Payer: Self-pay | Admitting: Family Medicine

## 2020-04-29 ENCOUNTER — Ambulatory Visit (INDEPENDENT_AMBULATORY_CARE_PROVIDER_SITE_OTHER): Payer: BC Managed Care – PPO | Admitting: Family Medicine

## 2020-04-29 ENCOUNTER — Other Ambulatory Visit: Payer: Self-pay

## 2020-04-29 ENCOUNTER — Encounter (INDEPENDENT_AMBULATORY_CARE_PROVIDER_SITE_OTHER): Payer: Self-pay | Admitting: Family Medicine

## 2020-04-29 VITALS — BP 142/74 | HR 113 | Resp 18 | Ht 71.0 in | Wt 237.0 lb

## 2020-04-29 DIAGNOSIS — E119 Type 2 diabetes mellitus without complications: Secondary | ICD-10-CM

## 2020-04-29 DIAGNOSIS — E782 Mixed hyperlipidemia: Secondary | ICD-10-CM

## 2020-04-29 DIAGNOSIS — I10 Essential (primary) hypertension: Secondary | ICD-10-CM

## 2020-04-29 LAB — POCT EAST HGB A1C (AMB): POCT HGB A1C: 6.4 % — AB (ref 4–6)

## 2020-04-29 MED ORDER — GLIPIZIDE 5 MG TABLET
10.00 mg | ORAL_TABLET | Freq: Every morning | ORAL | 3 refills | Status: DC
Start: 2020-04-29 — End: 2021-01-20

## 2020-04-29 MED ORDER — ATORVASTATIN 80 MG TABLET
80.00 mg | ORAL_TABLET | Freq: Every evening | ORAL | 3 refills | Status: DC
Start: 2020-04-29 — End: 2022-05-13

## 2020-04-29 NOTE — Nursing Note (Signed)
04/29/20 0800   A1C   Time Performed 0858   A1C 6.4   References Ranges 4 - 6%   A1C Lot # [id number]   Expiration Date 12/04/21   Internal Control Valid yes   Initials cb   Lancet used on  Finger

## 2020-04-29 NOTE — Progress Notes (Signed)
[facility]  [address] [place] [address]  Dept Phone: [phone]  Dept Fax: [phone]    Danny [name]  21-Jan-1961  [id number]    Date of Service: 04/29/2020   Chief complaint:   Chief Complaint   Patient presents with   . Follow Up 3 Months     anxiety depression, hypertension, hyperlipidemia, and diabetes.    . Blood Work     pt isnt fasting    . Medication Question     pt isnt on glipizide due to low b/s        Subjective:   Patient presents today for routine 3 month followup a to discuss his chronic medical problems which include his anxiety depression hypertension hyperlipidemia, and type 2 diabetes.  Patient states overall he is doing well.  His diabetes he does not check his blood sugars he was not able to tolerate the glipizide 10 mg so has been off of that for quite some time.  No headache dizziness chest pain or shortness of breath.  Blood pressure states does well at home.  He is dealing with Psychiatry for his anxiety depression issues.    Current Outpatient Medications   Medication Sig   . amLODIPine (NORVASC) 5 mg Oral Tablet TAKE 1 TABLET BY MOUTH EVERY DAY   . aspirin 81 mg Oral Tablet, Chewable Take 1 Tab (81 mg total) by mouth Once a day   . atorvastatin (LIPITOR) 80 mg Oral Tablet Take 1 Tablet (80 mg total) by mouth Every night   . Blood Sugar Diagnostic (ONETOUCH VERIO) Strip 1 Strip by Does not apply route Twice daily   . cholecalciferol, vitamin D3, 1,000 unit Oral Tablet Take 1,000 Units by mouth Once a day   . colesevelam (WELCHOL) 625 mg Oral Tablet TAKE 3 TABLETS BY MOUTH TWICE A DAY WITH FOOD   . cyanocobalamin (VITAMIN B 12) 1,000 mcg Oral Tablet Take 1,000 mcg by mouth Once a day   . diazePAM (VALIUM) 5 mg Oral Tablet Take 1 Tab (5 mg total) by mouth Every 12 hours as needed for Anxiety   . diphenhydrAMINE (BENADRYL) 25 mg Oral Capsule Take 25 mg by mouth Every 6 hours as needed   . EPINEPHrine (EPIPEN) 0.3 mg/0.3 mL Injection Auto-Injector 0.3  mL (0.3 mg total) by Intramuscular route Once, as needed for up to 1 dose   . FLUoxetine (PROZAC) 20 mg Oral Capsule Take 1 Capsule (20 mg total) by mouth Once a day for 30 days Take with 40 mg for total of 60 mg.   . FLUoxetine (PROZAC) 40 mg Oral Capsule Take 1 Capsule (40 mg total) by mouth Once a day for 30 days Take with 20 mg for total of 60 mg.   . fluticasone (FLONASE) 50 mcg/actuation Nasal Spray, Suspension 1 Spray by Each Nostril route Twice daily   . glipiZIDE (GLUCOTROL) 5 mg Oral Tablet Take 2 Tablets (10 mg total) by mouth Every morning before breakfast Take 30 minutes before meals   . Lancets Misc 1 Stick by Does not apply route Twice daily   . lansoprazole (PREVACID) 30 mg Oral Capsule, Delayed Release(E.C.) TAKE 1 CAPSULE BY MOUTH EVERY DAY   . loratadine (CLARITIN) 10 mg Oral Tablet Take 10 mg by mouth Once a day   . losartan (COZAAR) 50 mg Oral Tablet TAKE 1 TAB (50 MG TOTAL) BY MOUTH ONCE A DAY   . metoprolol succinate (TOPROL-XL) 25 mg Oral Tablet Sustained Release  24 hr TAKE 1 TABLET BY MOUTH EVERY DAY   . naphazoline-pheniramine (OPCON-A) 0.02675-0.315 % Ophthalmic Drops Instill 1 Drop into both eyes Once per day as needed   . ondansetron (ZOFRAN) 4 mg Oral Tablet Take 1 Tab (4 mg total) by mouth Every 8 hours as needed for nausea/vomiting   . PROAIR HFA 90 mcg/actuation Inhalation HFA Aerosol Inhaler Take 1-2 Puffs by inhalation Every 6 hours as needed   . triamcinolone acetonide (ARISTOCORT A) 0.1 % Cream Apply topically Twice daily       Objective:   BP (!) 142/74   Pulse (!) 113   Resp 18   Ht 1.803 m (5\' 11" )   Wt 108 kg (237 lb)   SpO2 96%   BMI 33.05 kg/m       General appearance: alert, oriented x 3, in his normal state, cooperative, not in apparent distress, appearing stated age   HEENT:  Eyes:  Pupils equal round react like accommodation extraocular muscles intact. Ears within normal limits throat clear, tonsils normal, no cervical lymphadenopathy thyroid normal  Lungs: clear  to auscultation bilaterally, respirations non-labored  Heart: regular rate and rhythm, S1, S2 normal, no murmur, PMI non-diffuse  Abdomen: soft, non-tender. Bowel sounds normal.  Extremities: extremities normal, atraumatic, no cyanosis or edema, pulses intact in upper and lower extremities    Assessment     ENCOUNTER DIAGNOSES     ICD-10-CM   1. Essential hypertension  I10   2. Mixed hyperlipidemia  E78.2   3. Type 2 diabetes mellitus without complication, without long-term current use of insulin (CMS HCC)  E11.9       Plan     Orders Placed This Encounter   . POCT EAST HGB A1C (AMB)   . atorvastatin (LIPITOR) 80 mg Oral Tablet   . glipiZIDE (GLUCOTROL) 5 mg Oral Tablet           Patient is to continue on all current medications other than those changed in the orders.     1. Hypertension:  Currently appears to be fairly well managed  2. Hyperlipidemia:  Last lipids were good will check at his next appointment  3. Type 2 diabetes:  Hemoglobin A1c today was 6.4.  I will go ahead and add glipizide but start him down at 5 mg will have patient call for problems.    Nelda Severe Mel Almond, M.D.

## 2020-06-04 ENCOUNTER — Telehealth (HOSPITAL_COMMUNITY): Payer: Self-pay

## 2020-06-04 NOTE — Telephone Encounter (Signed)
CM lm for patient and his wife.  06/04/2020  Franco Collet, CASE MANAGER

## 2020-07-09 ENCOUNTER — Other Ambulatory Visit (INDEPENDENT_AMBULATORY_CARE_PROVIDER_SITE_OTHER): Payer: Self-pay | Admitting: Family Medicine

## 2020-07-09 MED ORDER — DOXYCYCLINE MONOHYDRATE 100 MG TABLET
100.00 mg | ORAL_TABLET | Freq: Two times a day (BID) | ORAL | 0 refills | Status: AC
Start: 2020-07-09 — End: 2020-07-23

## 2020-07-16 ENCOUNTER — Other Ambulatory Visit (HOSPITAL_COMMUNITY): Payer: Self-pay | Admitting: Student in an Organized Health Care Education/Training Program

## 2020-08-06 ENCOUNTER — Other Ambulatory Visit (INDEPENDENT_AMBULATORY_CARE_PROVIDER_SITE_OTHER): Payer: Self-pay | Admitting: Family Medicine

## 2020-10-04 ENCOUNTER — Other Ambulatory Visit (INDEPENDENT_AMBULATORY_CARE_PROVIDER_SITE_OTHER): Payer: Self-pay | Admitting: Family Medicine

## 2020-10-04 DIAGNOSIS — R748 Abnormal levels of other serum enzymes: Secondary | ICD-10-CM

## 2020-10-04 DIAGNOSIS — I10 Essential (primary) hypertension: Secondary | ICD-10-CM

## 2020-10-22 ENCOUNTER — Other Ambulatory Visit (INDEPENDENT_AMBULATORY_CARE_PROVIDER_SITE_OTHER): Payer: Self-pay | Admitting: Family Medicine

## 2020-10-22 MED ORDER — LOSARTAN 50 MG TABLET
50.00 mg | ORAL_TABLET | Freq: Every day | ORAL | 5 refills | Status: DC
Start: 2020-10-22 — End: 2021-05-05

## 2020-10-23 ENCOUNTER — Other Ambulatory Visit (HOSPITAL_COMMUNITY): Payer: Self-pay | Admitting: Family Medicine

## 2020-10-24 ENCOUNTER — Encounter (INDEPENDENT_AMBULATORY_CARE_PROVIDER_SITE_OTHER): Payer: Self-pay | Admitting: Family Medicine

## 2020-10-24 ENCOUNTER — Other Ambulatory Visit (INDEPENDENT_AMBULATORY_CARE_PROVIDER_SITE_OTHER): Payer: Self-pay | Admitting: Family Medicine

## 2020-10-25 MED ORDER — AMLODIPINE 5 MG TABLET
ORAL_TABLET | ORAL | 5 refills | Status: DC
Start: 2020-10-25 — End: 2021-06-30

## 2020-10-25 MED ORDER — METOPROLOL SUCCINATE ER 25 MG TABLET,EXTENDED RELEASE 24 HR
ORAL_TABLET | ORAL | 5 refills | Status: DC
Start: 2020-10-25 — End: 2021-04-21

## 2020-10-25 MED ORDER — LANSOPRAZOLE 30 MG CAPSULE,DELAYED RELEASE
DELAYED_RELEASE_CAPSULE | ORAL | 5 refills | Status: DC
Start: 2020-10-25 — End: 2021-04-21

## 2020-12-10 ENCOUNTER — Other Ambulatory Visit (INDEPENDENT_AMBULATORY_CARE_PROVIDER_SITE_OTHER): Payer: Self-pay | Admitting: Family Medicine

## 2020-12-10 MED ORDER — DIAZEPAM 5 MG TABLET
5.0000 mg | ORAL_TABLET | Freq: Two times a day (BID) | ORAL | 1 refills | Status: DC | PRN
Start: 2020-12-10 — End: 2021-07-07

## 2020-12-10 MED ORDER — ESCITALOPRAM 10 MG TABLET
10.0000 mg | ORAL_TABLET | Freq: Every day | ORAL | 3 refills | Status: DC
Start: 2020-12-10 — End: 2021-04-21

## 2020-12-23 ENCOUNTER — Other Ambulatory Visit (INDEPENDENT_AMBULATORY_CARE_PROVIDER_SITE_OTHER): Payer: Self-pay | Admitting: Family Medicine

## 2020-12-30 ENCOUNTER — Ambulatory Visit (INDEPENDENT_AMBULATORY_CARE_PROVIDER_SITE_OTHER): Payer: BC Managed Care – PPO

## 2020-12-30 ENCOUNTER — Ambulatory Visit
Admission: RE | Admit: 2020-12-30 | Discharge: 2020-12-30 | Disposition: A | Discharge status: Home / Self Care | Payer: BC Managed Care – PPO | Source: Ambulatory Visit | Attending: Family Medicine | Admitting: Family Medicine

## 2020-12-30 ENCOUNTER — Other Ambulatory Visit (INDEPENDENT_AMBULATORY_CARE_PROVIDER_SITE_OTHER): Payer: Self-pay | Admitting: Family Medicine

## 2020-12-30 ENCOUNTER — Ambulatory Visit (INDEPENDENT_AMBULATORY_CARE_PROVIDER_SITE_OTHER): Payer: BC Managed Care – PPO | Admitting: Family Medicine

## 2020-12-30 ENCOUNTER — Other Ambulatory Visit: Payer: Self-pay

## 2020-12-30 ENCOUNTER — Encounter (INDEPENDENT_AMBULATORY_CARE_PROVIDER_SITE_OTHER): Payer: Self-pay | Admitting: Family Medicine

## 2020-12-30 ENCOUNTER — Other Ambulatory Visit (HOSPITAL_COMMUNITY): Payer: BC Managed Care – PPO | Admitting: Family Medicine

## 2020-12-30 VITALS — BP 142/84 | HR 89 | Temp 97.6°F | Resp 18 | Ht 71.0 in | Wt 239.0 lb

## 2020-12-30 DIAGNOSIS — R748 Abnormal levels of other serum enzymes: Secondary | ICD-10-CM | POA: Insufficient documentation

## 2020-12-30 DIAGNOSIS — R1011 Right upper quadrant pain: Secondary | ICD-10-CM

## 2020-12-30 DIAGNOSIS — F1010 Alcohol abuse, uncomplicated: Secondary | ICD-10-CM

## 2020-12-30 DIAGNOSIS — R1901 Right upper quadrant abdominal swelling, mass and lump: Secondary | ICD-10-CM

## 2020-12-30 LAB — CBC
HCT: 45.8 % (ref 38.9–52.0)
HGB: 15.1 g/dL (ref 13.4–17.5)
MCH: 31.3 pg (ref 26.0–32.0)
MCHC: 33 g/dL (ref 31.0–35.5)
MCV: 95 fL (ref 78.0–100.0)
MPV: 10 fL (ref 8.7–12.5)
PLATELETS: 202 10*3/uL (ref 150–400)
RBC: 4.82 10*6/uL (ref 4.50–6.10)
RDW-CV: 14.1 % (ref 11.5–15.5)
WBC: 7.4 10*3/uL (ref 3.7–11.0)

## 2020-12-30 LAB — COMPREHENSIVE METABOLIC PNL, FASTING
ALBUMIN: 4.3 g/dL (ref 3.2–4.6)
ALKALINE PHOSPHATASE: 78 U/L (ref 20–130)
ALT (SGPT): 47 U/L (ref <=52)
ANION GAP: 8 mmol/L
AST (SGOT): 58 U/L — ABNORMAL HIGH (ref <=35)
BILIRUBIN TOTAL: 1.2 mg/dL (ref 0.3–1.2)
BUN/CREA RATIO: 12
BUN: 12 mg/dL (ref 10–25)
CALCIUM: 9.5 mg/dL (ref 8.8–10.3)
CHLORIDE: 99 mmol/L (ref 98–111)
CO2 TOTAL: 28 mmol/L (ref 21–35)
CREATININE: 1.01 mg/dL (ref <=1.30)
ESTIMATED GFR: >60 mL/min/{1.73_m2}
GLUCOSE: 118 mg/dL — ABNORMAL HIGH (ref 70–110)
POTASSIUM: 4.5 mmol/L (ref 3.5–5.0)
PROTEIN TOTAL: 7.2 g/dL (ref 6.0–8.3)
SODIUM: 135 mmol/L (ref 135–145)

## 2020-12-30 LAB — AMMONIA: AMMONIA: 38 umol/L (ref 18–50)

## 2020-12-30 NOTE — Progress Notes (Signed)
[facility]  [address] [place] [address]  Dept Phone: [phone]  Dept Fax: [phone]    Danny [name]  Mar 07, 1961  [id number]    Date of Service: 12/30/2020   Chief complaint:   Chief Complaint   Patient presents with   . Flank Pain     right side pain    . Blood Work     pt brought in lab work        Subjective:   Patient presents today for an acute visit due to right upper quadrant abdominal pain.  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  His last rehab was in November of this past year.  Patient brought in blood work from that visit.  Patient states that he has a swelling and a lump that he can feel in his right upper quadrant.  He has some discomfort but no nausea vomiting fever or chills.  Unfortunately he started drinking beer approximately 2 weeks after getting home from alcohol rehab.  He continues to consume beer at this point but does not drink any other alcoholic beverage.  No other complaints.    Current Outpatient Medications   Medication Sig   . amLODIPine (NORVASC) 5 mg Oral Tablet TAKE 1 TABLET BY MOUTH EVERY DAY   . aspirin 81 mg Oral Tablet, Chewable Take 1 Tab (81 mg total) by mouth Once a day   . atorvastatin (LIPITOR) 80 mg Oral Tablet Take 1 Tablet (80 mg total) by mouth Every night   . Blood Sugar Diagnostic (ONETOUCH VERIO) Strip 1 Strip by Does not apply route Twice daily   . cholecalciferol, vitamin D3, 1,000 unit Oral Tablet Take 1,000 Units by mouth Once a day   . colesevelam (WELCHOL) 625 mg Oral Tablet TAKE 3 TABLETS BY MOUTH TWICE A DAY WITH FOOD   . cyanocobalamin (VITAMIN B 12) 1,000 mcg Oral Tablet Take 1,000 mcg by mouth Once a day   . diazePAM (VALIUM) 5 mg Oral Tablet Take 1 Tablet (5 mg total) by mouth Every 12 hours as needed for Anxiety   . diphenhydrAMINE (BENADRYL) 25 mg Oral Capsule Take 25 mg by mouth Every 6 hours as needed   .  EPINEPHrine (EPIPEN) 0.3 mg/0.3 mL Injection Auto-Injector 0.3 mL (0.3 mg total) by Intramuscular route Once, as needed for up to 1 dose   . escitalopram oxalate (LEXAPRO) 10 mg Oral Tablet Take 1 Tablet (10 mg total) by mouth Once a day   . fluticasone (FLONASE) 50 mcg/actuation Nasal Spray, Suspension 1 Spray by Each Nostril route Twice daily   . glipiZIDE (GLUCOTROL) 5 mg Oral Tablet Take 2 Tablets (10 mg total) by mouth Every morning before breakfast Take 30 minutes before meals (Patient not taking: Reported on 12/30/2020 )   . Lancets Misc 1 Stick by Does not apply route Twice daily   . lansoprazole (PREVACID) 30 mg Oral Capsule, Delayed Release(E.C.) TAKE 1 CAPSULE BY MOUTH EVERY DAY   . loratadine (CLARITIN) 10 mg Oral Tablet Take 10 mg by mouth Once a day   . losartan (COZAAR) 50 mg Oral Tablet Take 1 Tablet (50 mg total) by mouth Once a day   . metoprolol succinate (TOPROL-XL) 25 mg Oral Tablet Sustained Release 24 hr TAKE 1 TABLET BY MOUTH EVERY DAY   . naphazoline-pheniramine (OPCON-A) 0.02675-0.315 % Ophthalmic Drops Instill 1  Drop into both eyes Once per day as needed   . ondansetron (ZOFRAN) 4 mg Oral Tablet Take 1 Tab (4 mg total) by mouth Every 8 hours as needed for nausea/vomiting   . PROAIR HFA 90 mcg/actuation Inhalation HFA Aerosol Inhaler Take 1-2 Puffs by inhalation Every 6 hours as needed   . triamcinolone acetonide (ARISTOCORT A) 0.1 % Cream Apply topically Twice daily       Objective:   BP (!) 142/84   Pulse 89   Temp 36.4 C (97.6 F)   Resp 18   Ht 1.803 m (5\' 11" )   Wt 108 kg (239 lb)   SpO2 96%   BMI 33.33 kg/m       General appearance: alert, oriented x 3, in his normal state, cooperative, not in apparent distress, appearing stated age   HEENT:  Eyes:  Pupils equal round react like accommodation extraocular muscles intact. Ears within normal limits throat clear, tonsils normal, no cervical lymphadenopathy thyroid normal  Lungs: clear to auscultation bilaterally, respirations  non-labored  Heart: regular rate and rhythm, S1, S2 normal, no murmur, PMI non-diffuse  Abdomen: soft, non-tender. Bowel sounds normal.  There does appear to be some mild distension.  There is a palpable abnormality in the right upper quadrant which is difficult to obtain its dimensions secondary to his distension and body habitus.  Appears to be 3-5 cm in diameter and is nontender.  Extremities: extremities normal, atraumatic, no cyanosis or edema, pulses intact in upper and lower extremities    Assessment     ENCOUNTER DIAGNOSES     ICD-10-CM   1. Right upper quadrant abdominal pain  R10.11   2. Elevated liver enzymes  R74.8       Plan     Orders Placed This Encounter   . US ABDOMEN COMPLETE (ADULT)   . CBC   . COMPREHENSIVE METABOLIC PNL, FASTING   . AMMONIA           Patient is to continue on all current medications other than those changed in the orders.     Looking at the patient's lab work certainly I am concerned about ascites.  The palpable abnormalities in the region of his gallbladder however he does not have classic gallbladder symptoms.  Will get an ultrasound do some lab work will call patient with results.    Nelda Severe Mel Almond, M.D.

## 2021-01-18 ENCOUNTER — Ambulatory Visit
Admission: RE | Admit: 2021-01-18 | Discharge: 2021-01-18 | Disposition: A | Discharge status: Home / Self Care | Payer: BC Managed Care – PPO | Source: Ambulatory Visit | Attending: Family Medicine | Admitting: Family Medicine

## 2021-01-18 ENCOUNTER — Other Ambulatory Visit (INDEPENDENT_AMBULATORY_CARE_PROVIDER_SITE_OTHER): Payer: Self-pay | Admitting: Family Medicine

## 2021-01-18 ENCOUNTER — Other Ambulatory Visit: Payer: Self-pay

## 2021-01-18 DIAGNOSIS — R1901 Right upper quadrant abdominal swelling, mass and lump: Secondary | ICD-10-CM | POA: Insufficient documentation

## 2021-01-18 LAB — POC ISTAT CREATININE (RESULT): CREATININE, POC: 1 mg/dL (ref 0.00–1.70)

## 2021-01-20 ENCOUNTER — Telehealth: Payer: BC Managed Care – PPO | Admitting: Family Medicine

## 2021-01-20 ENCOUNTER — Encounter (INDEPENDENT_AMBULATORY_CARE_PROVIDER_SITE_OTHER): Payer: Self-pay | Admitting: Family Medicine

## 2021-01-20 ENCOUNTER — Other Ambulatory Visit (INDEPENDENT_AMBULATORY_CARE_PROVIDER_SITE_OTHER): Payer: Self-pay | Admitting: Family Medicine

## 2021-01-20 ENCOUNTER — Other Ambulatory Visit: Payer: Self-pay

## 2021-01-20 ENCOUNTER — Telehealth (INDEPENDENT_AMBULATORY_CARE_PROVIDER_SITE_OTHER): Payer: Self-pay | Admitting: Family Medicine

## 2021-01-20 VITALS — Ht 71.0 in | Wt 239.0 lb

## 2021-01-20 DIAGNOSIS — R748 Abnormal levels of other serum enzymes: Secondary | ICD-10-CM

## 2021-01-20 DIAGNOSIS — R1901 Right upper quadrant abdominal swelling, mass and lump: Secondary | ICD-10-CM

## 2021-01-20 DIAGNOSIS — R1011 Right upper quadrant pain: Secondary | ICD-10-CM

## 2021-01-20 MED ORDER — LORAZEPAM 2 MG TABLET
ORAL_TABLET | ORAL | 0 refills | Status: DC
Start: 2021-01-20 — End: 2021-02-05

## 2021-01-20 NOTE — Progress Notes (Signed)
[address] [place] [address]  Dept Phone: [phone]  Dept Fax: [phone]    Rucker Pridgeon  1961-10-01  [id number]    Date of Service: 01/20/2021   Chief complaint:   Chief Complaint   Patient presents with    Other     Pt was unable  to get his CT would like something to help him get through it        Subjective:   Patient was evaluated today via telephone visit.  He was located at his home address in [place] was located here my office in [place].  The patient requested understands and accepts the limitations associated with telephone visit and gave his verbal consent.  We primarily discussed his abdominal pain.  Patient was scheduled for a CT a couple of days ago but unfortunately he be got became very claustrophobic was unable to tolerate the CT scan so that had to be discontinued.  Patient still very nervous about this would like to have it evaluated.    Current Outpatient Medications   Medication Sig    amLODIPine (NORVASC) 5 mg Oral Tablet TAKE 1 TABLET BY MOUTH EVERY DAY    aspirin 81 mg Oral Tablet, Chewable Take 1 Tab (81 mg total) by mouth Once a day    atorvastatin (LIPITOR) 80 mg Oral Tablet Take 1 Tablet (80 mg total) by mouth Every night    Blood Sugar Diagnostic (ONETOUCH VERIO) Strip 1 Strip by Does not apply route Twice daily    cholecalciferol, vitamin D3, 1,000 unit Oral Tablet Take 1,000 Units by mouth Once a day    colesevelam (WELCHOL) 625 mg Oral Tablet TAKE 3 TABLETS BY MOUTH TWICE A DAY WITH FOOD    cyanocobalamin (VITAMIN B 12) 1,000 mcg Oral Tablet Take 1,000 mcg by mouth Once a day    diazePAM (VALIUM) 5 mg Oral Tablet Take 1 Tablet (5 mg total) by mouth Every 12 hours as needed for Anxiety    diphenhydrAMINE (BENADRYL) 25 mg Oral Capsule Take 25 mg by mouth Every 6 hours as needed    EPINEPHrine (EPIPEN) 0.3 mg/0.3 mL Injection Auto-Injector 0.3 mL (0.3 mg total) by Intramuscular route Once,  as needed for up to 1 dose    escitalopram oxalate (LEXAPRO) 10 mg Oral Tablet Take 1 Tablet (10 mg total) by mouth Once a day    fluticasone (FLONASE) 50 mcg/actuation Nasal Spray, Suspension 1 Spray by Each Nostril route Twice daily    glipiZIDE (GLUCOTROL) 5 mg Oral Tablet Take 2 Tablets (10 mg total) by mouth Every morning before breakfast Take 30 minutes before meals    Lancets Misc 1 Stick by Does not apply route Twice daily    lansoprazole (PREVACID) 30 mg Oral Capsule, Delayed Release(E.C.) TAKE 1 CAPSULE BY MOUTH EVERY DAY    loratadine (CLARITIN) 10 mg Oral Tablet Take 10 mg by mouth Once a day    LORazepam (ATIVAN) 2 mg Oral Tablet Take 1 tablet 1 hour prior to CT then repeat when called CT    losartan (COZAAR) 50 mg Oral Tablet Take 1 Tablet (50 mg total) by mouth Once a day    metoprolol succinate (TOPROL-XL) 25 mg Oral Tablet Sustained Release 24 hr TAKE 1 TABLET BY MOUTH EVERY DAY    naphazoline-pheniramine (OPCON-A) 0.02675-0.315 % Ophthalmic Drops Instill 1 Drop into both eyes Once per day as needed    ondansetron (ZOFRAN) 4 mg Oral Tablet  Take 1 Tab (4 mg total) by mouth Every 8 hours as needed for nausea/vomiting    PROAIR HFA 90 mcg/actuation Inhalation HFA Aerosol Inhaler Take 1-2 Puffs by inhalation Every 6 hours as needed    triamcinolone acetonide (ARISTOCORT A) 0.1 % Cream Apply topically Twice daily       Objective:   Ht 1.803 m (5\' 11" )    Wt 108 kg (239 lb)    BMI 33.33 kg/m           Assessment     ENCOUNTER DIAGNOSES     ICD-10-CM   1. Right upper quadrant abdominal pain  R10.11   2. Elevated liver enzymes  R74.8       Plan   No orders of the defined types were placed in this encounter.          Patient is to continue on all current medications other than those changed in the orders.     I will go ahead and make the patient appointment to see General surgery.  We rescheduled his CT and increase his sedation.  Will have patient follow up here once testing is done.  This  visit took approximately 10 minutes on the telephone.    Nelda Severe Mel Almond, M.D.

## 2021-01-20 NOTE — Telephone Encounter (Signed)
I called to see when I could get him in with Dr. Rowe Clack. They scheduled him for 9th at noon, because they really want him to do the CT Abdomen if possible. Lady Gary, MA  01/20/2021, 09:40

## 2021-01-20 NOTE — Telephone Encounter (Signed)
I spoke with the patient and send in a defer prescription for a stronger sedative her him to take when he has his CT.  Hopefully the CT can be done prior to the night.

## 2021-01-22 ENCOUNTER — Other Ambulatory Visit: Payer: Self-pay

## 2021-01-22 ENCOUNTER — Other Ambulatory Visit (INDEPENDENT_AMBULATORY_CARE_PROVIDER_SITE_OTHER): Payer: Self-pay | Admitting: Family Medicine

## 2021-01-22 ENCOUNTER — Ambulatory Visit
Admission: RE | Admit: 2021-01-22 | Discharge: 2021-01-22 | Disposition: A | Discharge status: Home / Self Care | Payer: BC Managed Care – PPO | Source: Ambulatory Visit | Attending: Family Medicine | Admitting: Family Medicine

## 2021-01-22 DIAGNOSIS — R1901 Right upper quadrant abdominal swelling, mass and lump: Secondary | ICD-10-CM | POA: Insufficient documentation

## 2021-01-22 MED ORDER — SODIUM CHLORIDE 0.9 % INJECTION SOLUTION
10.0000 mL | INTRAMUSCULAR | Status: DC
Start: 2021-01-22 — End: 2021-01-23

## 2021-01-22 MED ORDER — IOVERSOL 320 MG IODINE/ML INTRAVENOUS SOLUTION
95.0000 mL | INTRAVENOUS | Status: AC
Start: 2021-01-22 — End: 2021-01-22
  Administered 2021-01-22: 95 mL via INTRAVENOUS

## 2021-01-29 ENCOUNTER — Other Ambulatory Visit: Payer: Self-pay

## 2021-01-29 ENCOUNTER — Encounter (HOSPITAL_BASED_OUTPATIENT_CLINIC_OR_DEPARTMENT_OTHER): Payer: Self-pay | Admitting: Surgery

## 2021-01-29 ENCOUNTER — Ambulatory Visit: Payer: BC Managed Care – PPO | Attending: Surgery | Admitting: Surgery

## 2021-01-29 VITALS — BP 128/80 | HR 83 | Temp 97.7°F | Resp 18 | Ht 71.0 in | Wt 236.7 lb

## 2021-01-29 DIAGNOSIS — R1900 Intra-abdominal and pelvic swelling, mass and lump, unspecified site: Secondary | ICD-10-CM

## 2021-01-29 DIAGNOSIS — Z6833 Body mass index (BMI) 33.0-33.9, adult: Secondary | ICD-10-CM

## 2021-01-29 DIAGNOSIS — Z01818 Encounter for other preprocedural examination: Secondary | ICD-10-CM | POA: Insufficient documentation

## 2021-01-29 DIAGNOSIS — Z1211 Encounter for screening for malignant neoplasm of colon: Secondary | ICD-10-CM

## 2021-01-29 DIAGNOSIS — Z86010 Personal history of colonic polyps: Secondary | ICD-10-CM | POA: Insufficient documentation

## 2021-01-29 MED ORDER — POLYETHYLENE GLYCOL 3350 (BULK) POWDER
0 refills | Status: DC
Start: 2021-01-29 — End: 2021-02-03

## 2021-01-29 NOTE — H&P (View-Only) (Signed)
[facility]  Clinic History & Physical    Patient: Danny [name]  D.O.B.: 01/27/1961  MRN# [id number]  Date of Service: 01/29/2021  REFERRING PROVIDER: Wonda Horner.    HPI  Jahmez Bily is a 60 y.o. male who was seen today for Abdominal Mass. He reports pain and palpable mass in the RUQ. He had an abdominal US which shows a palpable abnormality in the right upper quadrant with hypoechoic heterogeneous cystic area in the soft tissues of the anterior abdominal wall. He had CT scan which shows no evidence of anterior abdominal wall mass. Images reviewed. Reports he first noticed the pain and swelling approximately eight months ago. Denies history of trauma to the area.     He has a history of colon polyps. His previous colonoscopy was in 2020 and shows a sessile serrated polyp in the cecum. He denies change in bowel habits, rectal bleeding, weight loss or abdominal pain. Negative family history of colon or rectal cancer.     Past Medical History:   Diagnosis Date   . Anxiety    . Elevated lipids    . GERD (gastroesophageal reflux disease)    . Hypertension    . Panic attack    . Sleep apnea     no cpap   . Vomiting    . Wears glasses           Past Surgical History:   Procedure Laterality Date   . COLONOSCOPY  2015    [facility]   . ESOPHAGOGASTRODUODENOSCOPY  2015    [facility]  07/28/2017    Dr. Stacie Glaze    . HX ELBOW SURGERY Left 2011   . HX HAND SURGERY Left 06/2013    incision and drainage   . HX HAND SURGERY Left     incision and drainage   . HX WISDOM TEETH EXTRACTION            Current Outpatient Medications   Medication Sig   . amLODIPine (NORVASC) 5 mg Oral Tablet TAKE 1 TABLET BY MOUTH EVERY DAY   . aspirin 81 mg Oral Tablet, Chewable Take 1 Tab (81 mg total) by mouth Once a day   . atorvastatin (LIPITOR) 80 mg Oral Tablet Take 1 Tablet (80 mg total) by mouth Every night   . Blood Sugar Diagnostic (ONETOUCH VERIO) Strip 1 Strip by Does not apply route Twice daily   .  cholecalciferol, vitamin D3, 1,000 unit Oral Tablet Take 1,000 Units by mouth Once a day   . colesevelam (WELCHOL) 625 mg Oral Tablet TAKE 3 TABLETS BY MOUTH TWICE A DAY WITH FOOD   . cyanocobalamin (VITAMIN B 12) 1,000 mcg Oral Tablet Take 1,000 mcg by mouth Once a day   . diazePAM (VALIUM) 5 mg Oral Tablet Take 1 Tablet (5 mg total) by mouth Every 12 hours as needed for Anxiety   . diphenhydrAMINE (BENADRYL) 25 mg Oral Capsule Take 25 mg by mouth Every 6 hours as needed   . EPINEPHrine (EPIPEN) 0.3 mg/0.3 mL Injection Auto-Injector 0.3 mL (0.3 mg total) by Intramuscular route Once, as needed for up to 1 dose   . escitalopram oxalate (LEXAPRO) 10 mg Oral Tablet Take 1 Tablet (10 mg total) by mouth Once a day   . fluticasone (FLONASE) 50 mcg/actuation Nasal Spray, Suspension 1 Spray by Each Nostril route Twice daily   . Lancets Misc 1 Stick by Does not apply route Twice daily   .  lansoprazole (PREVACID) 30 mg Oral Capsule, Delayed Release(E.C.) TAKE 1 CAPSULE BY MOUTH EVERY DAY   . loratadine (CLARITIN) 10 mg Oral Tablet Take 10 mg by mouth Once a day   . LORazepam (ATIVAN) 2 mg Oral Tablet Take 1 tablet 1 hour prior to CT then repeat when called CT   . losartan (COZAAR) 50 mg Oral Tablet Take 1 Tablet (50 mg total) by mouth Once a day   . metoprolol succinate (TOPROL-XL) 25 mg Oral Tablet Sustained Release 24 hr TAKE 1 TABLET BY MOUTH EVERY DAY   . naphazoline-pheniramine (OPCON-A) 0.02675-0.315 % Ophthalmic Drops Instill 1 Drop into both eyes Once per day as needed   . ondansetron (ZOFRAN) 4 mg Oral Tablet Take 1 Tab (4 mg total) by mouth Every 8 hours as needed for nausea/vomiting   . Polyethylene Glycol 3350 Powder 2pm day before 4 dulcolax tabs, 4pm 238gm miralax w 64oz gatorade (no red/Purple) drink 8oz Q 81min until gone   . PROAIR HFA 90 mcg/actuation Inhalation HFA Aerosol Inhaler Take 1-2 Puffs by inhalation Every 6 hours as needed   . triamcinolone acetonide (ARISTOCORT A) 0.1 % Cream Apply topically  Twice daily     Allergies   Allergen Reactions   . Percocet [Oxycodone] Anaphylaxis      Social History     Tobacco Use   . Smoking status: Never Smoker   . Smokeless tobacco: Never Used   Substance Use Topics   . Alcohol use: Yes     Comment: 6 cans of beer 2-3 times per week     Social History     Substance and Sexual Activity   Drug Use No      Family Medical History:     Problem Relation (Age of Onset)    Alzheimer's/Dementia Father    Cancer Other, Mother, Sister    HTN <20 y.o. Other    Healthy Sister, Son, Son    Hypertension (High Blood Pressure) Father              ROS:  General: Denies fever, chills, or any changes in weight.  EENT: Denies blurry vision, hearing loss.  Cardiovascular: Denies chest pain or palpitations.  Respiratory: Denies SOB, cough.  Gastrointestinal: Denies abdominal pain, N/V, constipation, or diarrhea. Denies any change in bowel movements or rectal bleeding.  GU: Denies dysuria, change in urinary habits, or hematuria.  Musculoskeletal: Denies weakness or joint pain. Denies trouble moving extremities.  Neurological: Denies HA, dizziness.  Hematologic: Denies hx of bleeding disorders or blood clots.  Psychiatric: Denies changes in mood, anxiety, or depression.    Objective:  BP 128/80   Pulse 83   Temp 36.5 C (97.7 F)   Resp 18   Ht 1.803 m (5\' 11" )   Wt 107 kg (236 lb 11.2 oz)   SpO2 96%   BMI 33.01 kg/m         Physical Exam:  Constitutional:  Alert, healthy, well nourished, no acute distress and not in pain.  HEENT: Normal  Neck: Trachea midline, supple.  Pulmonary: Normal effort and rate observed.  Cardiovascular: Regular rate and rhythm.  Abdomen/GI: Abdomen soft and supple; minimal RLQ tenderness. Non-distended; No masses present. Bowel sounds normal and active in all 4 quadrants. Negative for discrete mass int he right upper quadrant.   Extremities: No peripheral edema; No cyanosis or clubbing of nails.  Musculoskeletal: Normal muscle strength and tone of all four  extremities.  Skin: No rashes or lesions present; Warm and  dry. No jaundice.  Psychiatric: Normal mood and affect; Judgement and thought content normal.      Assessment:  ENCOUNTER DIAGNOSES     ICD-10-CM   1. History of colon polyps  Z86.010   2. Preoperative testing  Z01.818        Plan:   Orders Placed This Encounter   Procedures   . LOWER ENDOSCOPY ([facility])     Standing Status:   Future     Standing Expiration Date:   01/29/2022     Order Specific Question:   Type of process requested     Answer:   COLONOSCOPY     Order Specific Question:   Serv. Provider     Answer:   Rowe Clack     Order Specific Question:   Stop anticoagulation therapy     Answer:   Other     Order Specific Question:   Medical Clearance     Answer:   N/A     Order Specific Question:   Type of anesthesia     Answer:   OTHER   . CBC/DIFF     Standing Status:   Future     Standing Expiration Date:   03/29/2022   . COMPREHENSIVE METABOLIC PANEL, NON-FASTING     Standing Status:   Future     Standing Expiration Date:   03/29/2022   . COVID-19 SCREENING - PREOP     Standing Status:   Future     Standing Expiration Date:   03/29/2022     Order Specific Question:   SOURCE     Answer:   Nasopharyngeal Swab     Order Specific Question:   PATIENT SYMPTOMATIC     Answer:   See indication as documented     Order Specific Question:   Indication for Testing:     Answer:   Outpatient preop screening  Limited Use, See Surgical Guidelines     Order Specific Question:   Symptomatic as defined by CDC?     Answer:   No     Order Specific Question:   Employed in healthcare?     Answer:   Unknown     Order Specific Question:   Resident in a congregate (group) care setting?     Answer:   Unknown     Order Specific Question:   First Test?     Answer:   Unknown     Order Specific Question:   Student?     Answer:   Not a Student [3]     Medication Orders   Medications   . Polyethylene Glycol 3350 Powder     Sig: 2pm day before 4 dulcolax tabs, 4pm 238gm miralax w 64oz  gatorade (no red/Purple) drink 8oz Q 24min until gone     Dispense:  238 g     Refill:  0     Patient for colonoscopy    #1 I explained the risks of colonoscopy to the patient including bleeding, infection, missed polyps, inability to attain the cecum and need for barium enema, possibility of perforation and need for emergency surgery etc. The patient agrees to proceed.     #2 The patient will have preoperative bowel prep and laboratory studies.     The CT scan reports no evidence of abdominal wall mass in the body of the report however in the impression it reports there is a mass. I reviewed scan and do not see mass. Exam today is also  negative for discrete mass. ? Mild thickening of cecum vs. underdistension.    Patient aware and agreeable to proceed with surgery during COVID-19 pandemic.       Return if symptoms worsen or fail to improve.    He was given the opportunity to ask questions and those questions were appropriately answered. He agreed with the treatment plan and is encouraged to call with any additional questions or concerns.       Donaciano Eva, LPN   I am scribing for, and in the presence of Dr. Marguerita Beards for services provided on 01/29/2021.  Donaciano Eva, LPN     I personally performed the services described in this documentation, as scribed  in my presence, and it is both accurate  and complete.    Marguerita Beards, MD

## 2021-01-29 NOTE — Patient Instructions (Signed)
Pre-operative Education for [facility] and Bowel Surgery  I reviewed the following with the patient/family:   Discussion of Diet, handout given to patient to follow prior to procedure.   Medication reviewed with patient, verbal instructions and handout was given to   patient indicating Medication to continue and Medication that must be stopped.   Bowel Prep Instructions reviewed and a paper handout was included in the patient  packet.   Date and Time of Procedure and arrive time discussed with patient and included in  Packet   Pre-op testing and time frame to have test completed were discussed and written on order form in patient packet.

## 2021-01-29 NOTE — H&P (Signed)
[facility]  Clinic History & Physical    Patient: Danny [name]  D.O.B.: Nov 04, 1961  MRN# [id number]  Date of Service: 01/29/2021  REFERRING PROVIDER: Wonda Horner.    HPI  Cordarrius Coad is a 60 y.o. male who was seen today for Abdominal Mass. He reports pain and palpable mass in the RUQ. He had an abdominal US which shows a palpable abnormality in the right upper quadrant with hypoechoic heterogeneous cystic area in the soft tissues of the anterior abdominal wall. He had CT scan which shows no evidence of anterior abdominal wall mass. Images reviewed. Reports he first noticed the pain and swelling approximately eight months ago. Denies history of trauma to the area.     He has a history of colon polyps. His previous colonoscopy was in 2020 and shows a sessile serrated polyp in the cecum. He denies change in bowel habits, rectal bleeding, weight loss or abdominal pain. Negative family history of colon or rectal cancer.     Past Medical History:   Diagnosis Date   . Anxiety    . Elevated lipids    . GERD (gastroesophageal reflux disease)    . Hypertension    . Panic attack    . Sleep apnea     no cpap   . Vomiting    . Wears glasses           Past Surgical History:   Procedure Laterality Date   . COLONOSCOPY  2015    [facility]   . ESOPHAGOGASTRODUODENOSCOPY  2015    [facility]  07/28/2017    Dr. Stacie Glaze    . HX ELBOW SURGERY Left 2011   . HX HAND SURGERY Left 06/2013    incision and drainage   . HX HAND SURGERY Left     incision and drainage   . HX WISDOM TEETH EXTRACTION            Current Outpatient Medications   Medication Sig   . amLODIPine (NORVASC) 5 mg Oral Tablet TAKE 1 TABLET BY MOUTH EVERY DAY   . aspirin 81 mg Oral Tablet, Chewable Take 1 Tab (81 mg total) by mouth Once a day   . atorvastatin (LIPITOR) 80 mg Oral Tablet Take 1 Tablet (80 mg total) by mouth Every night   . Blood Sugar Diagnostic (ONETOUCH VERIO) Strip 1 Strip by Does not apply route Twice daily   .  cholecalciferol, vitamin D3, 1,000 unit Oral Tablet Take 1,000 Units by mouth Once a day   . colesevelam (WELCHOL) 625 mg Oral Tablet TAKE 3 TABLETS BY MOUTH TWICE A DAY WITH FOOD   . cyanocobalamin (VITAMIN B 12) 1,000 mcg Oral Tablet Take 1,000 mcg by mouth Once a day   . diazePAM (VALIUM) 5 mg Oral Tablet Take 1 Tablet (5 mg total) by mouth Every 12 hours as needed for Anxiety   . diphenhydrAMINE (BENADRYL) 25 mg Oral Capsule Take 25 mg by mouth Every 6 hours as needed   . EPINEPHrine (EPIPEN) 0.3 mg/0.3 mL Injection Auto-Injector 0.3 mL (0.3 mg total) by Intramuscular route Once, as needed for up to 1 dose   . escitalopram oxalate (LEXAPRO) 10 mg Oral Tablet Take 1 Tablet (10 mg total) by mouth Once a day   . fluticasone (FLONASE) 50 mcg/actuation Nasal Spray, Suspension 1 Spray by Each Nostril route Twice daily   . Lancets Misc 1 Stick by Does not apply route Twice daily   .  lansoprazole (PREVACID) 30 mg Oral Capsule, Delayed Release(E.C.) TAKE 1 CAPSULE BY MOUTH EVERY DAY   . loratadine (CLARITIN) 10 mg Oral Tablet Take 10 mg by mouth Once a day   . LORazepam (ATIVAN) 2 mg Oral Tablet Take 1 tablet 1 hour prior to CT then repeat when called CT   . losartan (COZAAR) 50 mg Oral Tablet Take 1 Tablet (50 mg total) by mouth Once a day   . metoprolol succinate (TOPROL-XL) 25 mg Oral Tablet Sustained Release 24 hr TAKE 1 TABLET BY MOUTH EVERY DAY   . naphazoline-pheniramine (OPCON-A) 0.02675-0.315 % Ophthalmic Drops Instill 1 Drop into both eyes Once per day as needed   . ondansetron (ZOFRAN) 4 mg Oral Tablet Take 1 Tab (4 mg total) by mouth Every 8 hours as needed for nausea/vomiting   . Polyethylene Glycol 3350 Powder 2pm day before 4 dulcolax tabs, 4pm 238gm miralax w 64oz gatorade (no red/Purple) drink 8oz Q 49min until gone   . PROAIR HFA 90 mcg/actuation Inhalation HFA Aerosol Inhaler Take 1-2 Puffs by inhalation Every 6 hours as needed   . triamcinolone acetonide (ARISTOCORT A) 0.1 % Cream Apply topically  Twice daily     Allergies   Allergen Reactions   . Percocet [Oxycodone] Anaphylaxis      Social History     Tobacco Use   . Smoking status: Never Smoker   . Smokeless tobacco: Never Used   Substance Use Topics   . Alcohol use: Yes     Comment: 6 cans of beer 2-3 times per week     Social History     Substance and Sexual Activity   Drug Use No      Family Medical History:     Problem Relation (Age of Onset)    Alzheimer's/Dementia Father    Cancer Other, Mother, Sister    HTN <20 y.o. Other    Healthy Sister, Son, Son    Hypertension (High Blood Pressure) Father              ROS:  General: Denies fever, chills, or any changes in weight.  EENT: Denies blurry vision, hearing loss.  Cardiovascular: Denies chest pain or palpitations.  Respiratory: Denies SOB, cough.  Gastrointestinal: Denies abdominal pain, N/V, constipation, or diarrhea. Denies any change in bowel movements or rectal bleeding.  GU: Denies dysuria, change in urinary habits, or hematuria.  Musculoskeletal: Denies weakness or joint pain. Denies trouble moving extremities.  Neurological: Denies HA, dizziness.  Hematologic: Denies hx of bleeding disorders or blood clots.  Psychiatric: Denies changes in mood, anxiety, or depression.    Objective:  BP 128/80   Pulse 83   Temp 36.5 C (97.7 F)   Resp 18   Ht 1.803 m (5\' 11" )   Wt 107 kg (236 lb 11.2 oz)   SpO2 96%   BMI 33.01 kg/m         Physical Exam:  Constitutional:  Alert, healthy, well nourished, no acute distress and not in pain.  HEENT: Normal  Neck: Trachea midline, supple.  Pulmonary: Normal effort and rate observed.  Cardiovascular: Regular rate and rhythm.  Abdomen/GI: Abdomen soft and supple; minimal RLQ tenderness. Non-distended; No masses present. Bowel sounds normal and active in all 4 quadrants. Negative for discrete mass int he right upper quadrant.   Extremities: No peripheral edema; No cyanosis or clubbing of nails.  Musculoskeletal: Normal muscle strength and tone of all four  extremities.  Skin: No rashes or lesions present; Warm and  dry. No jaundice.  Psychiatric: Normal mood and affect; Judgement and thought content normal.      Assessment:  ENCOUNTER DIAGNOSES     ICD-10-CM   1. History of colon polyps  Z86.010   2. Preoperative testing  Z01.818        Plan:   Orders Placed This Encounter   Procedures   . LOWER ENDOSCOPY ([facility])     Standing Status:   Future     Standing Expiration Date:   01/29/2022     Order Specific Question:   Type of process requested     Answer:   COLONOSCOPY     Order Specific Question:   Serv. Provider     Answer:   Rowe Clack     Order Specific Question:   Stop anticoagulation therapy     Answer:   Other     Order Specific Question:   Medical Clearance     Answer:   N/A     Order Specific Question:   Type of anesthesia     Answer:   OTHER   . CBC/DIFF     Standing Status:   Future     Standing Expiration Date:   03/29/2022   . COMPREHENSIVE METABOLIC PANEL, NON-FASTING     Standing Status:   Future     Standing Expiration Date:   03/29/2022   . COVID-19 SCREENING - PREOP     Standing Status:   Future     Standing Expiration Date:   03/29/2022     Order Specific Question:   SOURCE     Answer:   Nasopharyngeal Swab     Order Specific Question:   PATIENT SYMPTOMATIC     Answer:   See indication as documented     Order Specific Question:   Indication for Testing:     Answer:   Outpatient preop screening  Limited Use, See Surgical Guidelines     Order Specific Question:   Symptomatic as defined by CDC?     Answer:   No     Order Specific Question:   Employed in healthcare?     Answer:   Unknown     Order Specific Question:   Resident in a congregate (group) care setting?     Answer:   Unknown     Order Specific Question:   First Test?     Answer:   Unknown     Order Specific Question:   Student?     Answer:   Not a Student [3]     Medication Orders   Medications   . Polyethylene Glycol 3350 Powder     Sig: 2pm day before 4 dulcolax tabs, 4pm 238gm miralax w 64oz  gatorade (no red/Purple) drink 8oz Q 15min until gone     Dispense:  238 g     Refill:  0     Patient for colonoscopy    #1 I explained the risks of colonoscopy to the patient including bleeding, infection, missed polyps, inability to attain the cecum and need for barium enema, possibility of perforation and need for emergency surgery etc. The patient agrees to proceed.     #2 The patient will have preoperative bowel prep and laboratory studies.     The CT scan reports no evidence of abdominal wall mass in the body of the report however in the impression it reports there is a mass. I reviewed scan and do not see mass. Exam today is also  negative for discrete mass. ? Mild thickening of cecum vs. underdistension.    Patient aware and agreeable to proceed with surgery during COVID-19 pandemic.       Return if symptoms worsen or fail to improve.    He was given the opportunity to ask questions and those questions were appropriately answered. He agreed with the treatment plan and is encouraged to call with any additional questions or concerns.       Donaciano Eva, LPN   I am scribing for, and in the presence of Dr. Marguerita Beards for services provided on 01/29/2021.  Donaciano Eva, LPN     I personally performed the services described in this documentation, as scribed  in my presence, and it is both accurate  and complete.    Marguerita Beards, MD

## 2021-01-30 ENCOUNTER — Encounter (HOSPITAL_COMMUNITY): Payer: Self-pay | Admitting: Surgery

## 2021-01-30 NOTE — Nursing Note (Signed)
COVID-19 Admission Screen    Low Risk:   Able to Provide asymptomatic History  No recent Travel/ Following Stay at Home  No Sick Contacts  No New Household Conatcts    Moderate/High Risk: {None    Test Result:Pending    Pt testing 2/11.  Hours of operation (9-3) and driving directions provided.    Erick Alley, RN  01/30/2021, 14:46

## 2021-01-31 ENCOUNTER — Ambulatory Visit (HOSPITAL_COMMUNITY): Payer: BC Managed Care – PPO

## 2021-01-31 ENCOUNTER — Ambulatory Visit: Payer: BC Managed Care – PPO | Attending: Surgery

## 2021-01-31 ENCOUNTER — Other Ambulatory Visit: Payer: Self-pay

## 2021-01-31 DIAGNOSIS — Z86010 Personal history of colonic polyps: Secondary | ICD-10-CM

## 2021-01-31 DIAGNOSIS — Z01812 Encounter for preprocedural laboratory examination: Secondary | ICD-10-CM | POA: Insufficient documentation

## 2021-01-31 DIAGNOSIS — Z01818 Encounter for other preprocedural examination: Secondary | ICD-10-CM

## 2021-01-31 DIAGNOSIS — Z20822 Contact with and (suspected) exposure to covid-19: Secondary | ICD-10-CM | POA: Insufficient documentation

## 2021-01-31 LAB — CBC WITH DIFF
BASOPHIL #: <0.1 10*3/uL (ref <=0.20)
BASOPHIL %: 2 %
EOSINOPHIL #: <0.1 10*3/uL (ref <=0.50)
EOSINOPHIL %: 1 %
HCT: 46.9 % (ref 38.9–52.0)
HGB: 16.1 g/dL (ref 13.4–17.5)
IMMATURE GRANULOCYTE #: <0.1 10*3/uL (ref <0.10)
IMMATURE GRANULOCYTE %: 0 % (ref 0–1)
LYMPHOCYTE #: 1.76 10*3/uL (ref 1.00–4.80)
LYMPHOCYTE %: 33 %
MCH: 32.7 pg — ABNORMAL HIGH (ref 26.0–32.0)
MCHC: 34.3 g/dL (ref 31.0–35.5)
MCV: 95.1 fL (ref 78.0–100.0)
MONOCYTE #: 0.99 10*3/uL (ref 0.20–1.10)
MONOCYTE %: 18 %
MPV: 8.8 fL (ref 8.7–12.5)
NEUTROPHIL #: 2.48 10*3/uL (ref 1.50–7.70)
NEUTROPHIL %: 46 %
PLATELETS: 322 10*3/uL (ref 150–400)
RBC: 4.93 10*6/uL (ref 4.50–6.10)
RDW-CV: 14.6 % (ref 11.5–15.5)
WBC: 5.4 10*3/uL (ref 3.7–11.0)

## 2021-01-31 LAB — COMPREHENSIVE METABOLIC PANEL, NON-FASTING
ALBUMIN: 4.7 g/dL — ABNORMAL HIGH (ref 3.2–4.6)
ALKALINE PHOSPHATASE: 72 U/L (ref 20–130)
ALT (SGPT): 86 U/L — ABNORMAL HIGH (ref <=52)
ANION GAP: 10 mmol/L
AST (SGOT): 79 U/L — ABNORMAL HIGH (ref <=35)
BILIRUBIN TOTAL: 0.5 mg/dL (ref 0.3–1.2)
BUN/CREA RATIO: 4
BUN: 4 mg/dL — ABNORMAL LOW (ref 10–25)
CALCIUM: 9.7 mg/dL (ref 8.8–10.3)
CHLORIDE: 92 mmol/L — ABNORMAL LOW (ref 98–111)
CO2 TOTAL: 27 mmol/L (ref 21–35)
CREATININE: 0.94 mg/dL (ref <=1.30)
ESTIMATED GFR: >60 mL/min/{1.73_m2}
GLUCOSE: 91 mg/dL (ref 70–110)
POTASSIUM: 4.8 mmol/L (ref 3.5–5.0)
PROTEIN TOTAL: 7.6 g/dL (ref 6.0–8.3)
SODIUM: 129 mmol/L — ABNORMAL LOW (ref 135–145)

## 2021-01-31 LAB — COVID-19 ~~LOC~~ MOLECULAR LAB TESTING: SARS-COV-2: NOT DETECTED

## 2021-02-03 ENCOUNTER — Other Ambulatory Visit: Payer: Self-pay

## 2021-02-03 ENCOUNTER — Ambulatory Visit (HOSPITAL_COMMUNITY): Payer: BC Managed Care – PPO | Admitting: General Acute Care Hospital

## 2021-02-03 ENCOUNTER — Inpatient Hospital Stay
Admission: RE | Admit: 2021-02-03 | Discharge: 2021-02-03 | Disposition: A | Discharge status: Home / Self Care | Payer: BC Managed Care – PPO | Source: Ambulatory Visit | Attending: Surgery | Admitting: Surgery

## 2021-02-03 ENCOUNTER — Ambulatory Visit (HOSPITAL_BASED_OUTPATIENT_CLINIC_OR_DEPARTMENT_OTHER): Payer: BC Managed Care – PPO | Admitting: General Acute Care Hospital

## 2021-02-03 ENCOUNTER — Encounter (HOSPITAL_COMMUNITY)
Admission: RE | Disposition: A | Discharge status: Home / Self Care | Payer: Self-pay | Source: Ambulatory Visit | Attending: Surgery

## 2021-02-03 DIAGNOSIS — Z1211 Encounter for screening for malignant neoplasm of colon: Secondary | ICD-10-CM

## 2021-02-03 DIAGNOSIS — K635 Polyp of colon: Secondary | ICD-10-CM

## 2021-02-03 DIAGNOSIS — Z86010 Personal history of colonic polyps: Secondary | ICD-10-CM | POA: Insufficient documentation

## 2021-02-03 LAB — POC BLOOD GLUCOSE (RESULTS): GLUCOSE, POC: 168 mg/dl — ABNORMAL HIGH (ref 70–110)

## 2021-02-03 SURGERY — COLONOSCOPY
Anesthesia: Monitor Anesthesia Care | Wound class: Clean Contaminated Wounds-The respiratory, GI, Genital, or urinary

## 2021-02-03 MED ORDER — LACTATED RINGERS INTRAVENOUS SOLUTION
INTRAVENOUS | Status: DC | PRN
Start: 2021-02-03 — End: 2021-02-03

## 2021-02-03 MED ORDER — DEXMEDETOMIDINE 4 MCG/ML IV DILUTION
Freq: Once | INTRAMUSCULAR | Status: DC | PRN
Start: 2021-02-03 — End: 2021-02-03
  Administered 2021-02-03 (×2): 10 ug via INTRAVENOUS

## 2021-02-03 MED ORDER — PROPOFOL 10 MG/ML IV - CHI
INTRAVENOUS | Status: DC | PRN
Start: 2021-02-03 — End: 2021-02-03
  Administered 2021-02-03: 100 ug/kg/min via INTRAVENOUS
  Administered 2021-02-03: 200 ug/kg/min via INTRAVENOUS
  Administered 2021-02-03: 0 ug/kg/min via INTRAVENOUS

## 2021-02-03 MED ORDER — SIMETHICONE 40 MG/0.6 ML ORAL DROPS,SUSPENSION
Freq: Once | ORAL | Status: DC | PRN
Start: 2021-02-03 — End: 2021-02-03
  Administered 2021-02-03: 40 mg

## 2021-02-03 MED ORDER — LACTATED RINGERS INTRAVENOUS SOLUTION
INTRAVENOUS | Status: DC
Start: 2021-02-03 — End: 2021-02-03

## 2021-02-03 MED ORDER — SODIUM CHLORIDE 0.9 % (FLUSH) INJECTION SYRINGE
3.0000 mL | INJECTION | Freq: Three times a day (TID) | INTRAMUSCULAR | Status: DC
Start: 2021-02-03 — End: 2021-02-03

## 2021-02-03 MED ORDER — PROPOFOL 10 MG/ML IV BOLUS
INJECTION | Freq: Once | INTRAVENOUS | Status: DC | PRN
Start: 2021-02-03 — End: 2021-02-03
  Administered 2021-02-03: 50 mg via INTRAVENOUS
  Administered 2021-02-03: 100 mg via INTRAVENOUS

## 2021-02-03 MED ORDER — SODIUM CHLORIDE 0.9 % (FLUSH) INJECTION SYRINGE
3.0000 mL | INJECTION | INTRAMUSCULAR | Status: DC | PRN
Start: 2021-02-03 — End: 2021-02-03

## 2021-02-03 MED ORDER — LIDOCAINE (PF) 100 MG/5 ML (2 %) INTRAVENOUS SYRINGE
INJECTION | Freq: Once | INTRAVENOUS | Status: DC | PRN
Start: 2021-02-03 — End: 2021-02-03
  Administered 2021-02-03: 100 mg via INTRAVENOUS

## 2021-02-03 SURGICAL SUPPLY — 40 items
BRUSH CYTO 1.5MM 140CM BLT TIP ERG HNDL ATO STP SHEATH CLBR STRL DISP PTFE BRONCHSCP (CANNULA) IMPLANT
BRUSH CYTOLOGY BRONCHOSCOPY ERG HNDL ATO STP SHEATH CLBR (CANNULA)
CATH ELHMST INJ GLD PRB 7FR 25 GA .24MM 210CM BIPO RND DIST (BALLOON)
CATH ELHMST INJ GLD PROBE 7FR 25GA .24MM 210CM BIPOLAR RND DIST TIP STD CONN INTGR DISP 2.8MM MN WRK (BALLOON) IMPLANT
CLIP HMST MR CONDITIONAL BRD CATH ROT CONTROL KNOB NO SHEATH RSL 360 235CM 2.8MM 11MM OPN (SURGICAL INSTRUMENTS) IMPLANT
DEVICE HEMOSTASIS CLIP 360 235CM RESOLUTION (INSTRUMENTS)
DISCONTINUED USE ITEM 328361 - JELLY LUB EZ BCTRST H2O SOL NG_RS FLPTP TUBE STRL 2OZ LF (MISCELLANEOUS PT CARE ITEMS) IMPLANT
FORCEPS BIOPSY HOT 240CM 2.2MM RJ 4 +2.8MM DISP (INSTRUMENTS)
FORCEPS BIOPSY HOT 240CM 2.2MM RJ 4 +2.8MM DISPO (SURGICAL INSTRUMENTS) IMPLANT
FORCEPS BIOPSY LRG CPC NEEDLE 240CM 2.2MM RJ 3 DISP ORNG (ENDOSCOPIC SUPPLIES) IMPLANT
FORCEPS BIOPSY NEEDLE 160CM 1. 8MM RJ 4 PED 2+ MM DISP (INSTRUMENTS)
FORCEPS BIOPSY NEEDLE 160CM 1.8MM RJ 4 DISP YW 2MM WRK CHNL GSPED (SURGICAL INSTRUMENTS) IMPLANT
FORCEPS BIOPSY NEEDLE 240CM RJ 4 JMB (SURGICAL INSTRUMENTS) ×1 IMPLANT
FORCEPS BIOPSY NEEDLE 240CM RJ 4 JMB DISP (INSTRUMENTS) ×1
FORCEPS BIOPSY NEEDLE 240CM RJ 4 LRG CPC DISP (INSTRUMENTS ENDOMECHANICAL)
GW ENDOSCOPIC .035IN 260CM DREAMWIRE ANG RX EGLD NITINOL BIL STRL DISP (ENDOSCOPIC SUPPLIES) IMPLANT
JELLY LUB EZ BCTRST H2O SOL NG RS FLPTP TUBE STRL 2OZ LF (MISCELLANEOUS PT CARE ITEMS)
JELLY LUB EZ BCTRST H2O SOL NG_RS FLPTP TUBE STRL 2OZ LF (MISCELLANEOUS PT CARE ITEMS)
KIT CAN MDVC ASCP SPECI CNVRT NONST LF (TEST)
LIGATOR 2.8MM 8.6-11.5MM SSS7 ESOPH 1 STNG MLT BAND ERG HNDL (GI LAB SUPPLIES)
LIGATOR 2.8MM 8.6-11.5MM SSS7 ESOPH 1 STNG MLT BAND HNDL STRL DISP ENDOS HMSTS LF (GI LAB SUPPLIES) IMPLANT
MARKER ENDOS PMT ENDOMARK DIL INDIA INK 10ML STRL (GI LAB SUPPLIES) IMPLANT
MARKER ENDOS SPOT EX PERM IND DRK SYRG 5ML (GENE) IMPLANT
MARKER ENDOS SPOT EX PREFL PRE ASSEMBLE SYRG PERM (GENE)
NEEDLE SCLRTX 25GA 2.3MM BVL S TRL DISP STAR CATH INTJCT 4MM (NEEDLES & SYRINGE SUPPLIES)
NEEDLE SCLRTX 25GA 2.3MM BVL STRL DISP STAR CATH INTJCT 4MM 240CM (NEEDLES & SYRINGE SUPPLIES) IMPLANT
NET SPEC RETR 230CM 2.5MM RTHN T STD SHTH 6X3CM NONST LF (Dilators)
NET SPEC RETR 230CM 2.5MM RTHNT STD SHEATH 6X3CM NONST LF  DISP (Dilators) IMPLANT
PEN SURG MRKNG INDIA INK (GI LAB SUPPLIES)
PROBE ESURG 220CM 2.3MM FIAPC FLXB STR FIRE ARGON PLAS COAG (CAUTERY SUPPLIES)
PROBE ESURG 220CM 2.3MM FIAPC FLXB STR FIRE STRL DISP (CAUTERY SUPPLIES) IMPLANT
SNARE 230CM 2.5MM 3CM PLPK PLP CTM OVL ENDOS 6CM STRL DISP (INT)
SNARE 230CM 2.5MM 3CM PLPK ROTR RTHNT ENDOS NONST LF  DISP (INT) IMPLANT
SNARE LRG OVL MED STF ENDOS OL MPS DISP (INSTRUMENTS ENDOMECHANICAL)
SNARE MED OVAL 240CM 2.4MM CAPTIVATR STF ENDOS PLYP 27MM DISP (UROLOGICAL SUPPLIES) IMPLANT
SNARE MED OVL 240CM 2.4MM CAPT IVATR STF ENDOS PLYP 27MM DISP (UROLOGICAL SUPPLIES)
SOCK CAN MEDIVAC ASCP SPECI CNVRT NONST LF (TEST) IMPLANT
STENT WALLFLEX 25-30MM 10FR 90 CM 270CM COLON DEL SYS NTNL (STENT) IMPLANT
TRAY GASTRIC LAV 36IN 34FR ARGYLE EDLICH MONOJECT LRG PVC 4 EYE CLS END GRAD SYRG TRNSPR 140CC NONST (TRAY) IMPLANT
TRAY GASTRIC LAV 36IN 34FR ARG_YLE EDLICH MONOJECT LRG PVC 4 (TRAY)

## 2021-02-03 NOTE — Anesthesia Postprocedure Evaluation (Signed)
Anesthesia Post Op Evaluation    Patient: Danny [name]  Procedure(s):  COLONOSCOPY with Bx    Last Vitals:Temperature: 36.7 C (98.1 F) (02/03/21 0927)  Heart Rate: 86 (02/03/21 1038)  BP (Non-Invasive): 101/66 (02/03/21 1038)  Respiratory Rate: 15 (02/03/21 1038)  SpO2: 97 % (02/03/21 6773)    No complications documented.    Patient is sufficiently recovered from the effects of anesthesia to participate in the evaluation and has returned to their pre-procedure level.  Patient location during evaluation: bedside       Patient participation: complete - patient participated  Level of consciousness: awake and alert    Pain management: adequate  Airway patency: patent    Anesthetic complications: no  Cardiovascular status: acceptable  Respiratory status: acceptable  Hydration status: acceptable  Patient post-procedure temperature: Pt Normothermic   PONV Status: Absent

## 2021-02-03 NOTE — Interval H&P Note (Signed)
[facility]      H&P [place]                                                                                  Harvin, Konicek, 60 y.o. male  Date of Admission:  02/03/2021  Date of Birth:  1961/08/10    02/03/2021    STOP: IF H&P IS GREATER THAN 30 DAYS FROM SURGICAL DAY COMPLETE NEW H&P IS REQUIRED.     H & P updated the day of the procedure.  1.  H&P completed within 30 days of surgical procedure and has been reviewed within 24 hours of admission but prior to surgery or a procedure requiring anesthesia services, the patient has been examined, and no change has occured in the patients condition since the H&P was completed.       Change in medications: No              Comments:     2.  Patient continues to be appropriate candidate for planned surgical procedure. YES    Marguerita Beards, MD

## 2021-02-03 NOTE — Anesthesia OR-ICU Handoff (Signed)
Anesthesia ICU Transfer of Danny [name] is a 60 y.o. ,male, Weight: 104 kg (230 lb 2.6 oz)   had Procedure(s):  COLONOSCOPY with Bx  performed  02/03/21   Primary Service: Marguerita Beards, MD    Past Medical History:   Diagnosis Date   . Anxiety    . Elevated lipids    . GERD (gastroesophageal reflux disease)    . Hypertension    . Panic attack    . Sleep apnea     no cpap   . Vomiting    . Wears glasses       Allergy History as of 02/03/21     OXYCODONE       Noted Status Severity Type Reaction    07/10/13 [address], [place] 07/10/13 Active High Systemic Anaphylaxis              I completed my ICU transfer of care/ Handoff to the ICU receiving personnel during which we discussed :    Last OR Temp: Temperature: 36.7 C (98.1 F)  ABG:  POTASSIUM   Date Value Ref Range Status   01/31/2021 4.8 3.5 - 5.0 mmol/L Final   11/04/2017 4.3  Final     CALCIUM   Date Value Ref Range Status   01/31/2021 9.7 8.8 - 10.3 mg/dL Final     Calculated P Axis   Date Value Ref Range Status   10/23/2016 43 deg Final     Calculated T Axis   Date Value Ref Range Status   10/23/2016 0 deg Final     Airway:* No LDAs found *

## 2021-02-03 NOTE — Anesthesia Preprocedure Evaluation (Addendum)
ANESTHESIA PRE-OP EVALUATION  Planned Procedure: COLONOSCOPY (N/A )  Review of Systems     anesthesia history negative     patient summary reviewed  nursing notes reviewed        Pulmonary   sleep apnea (does not wear cpap),   Cardiovascular    Hypertension, well controlled, ECG reviewed, ACE / ARB inhibitor use and ACE inhibitor taken in the last 24 hours ,No peripheral edema,  Exercise Tolerance: > or = 4 METS   ,beta blocker therapy  ,beta blockers in last 24 hours     GI/Hepatic/Renal    GERD and well controlled     Endo/Other    morbid obesity,   type 2 diabetes/ controlled/ controlled with diet/ controlled with oral medications     Neuro/Psych/MS    anxiety     Cancer  negative hematology/oncology ROS,                    Physical Assessment      Patient summary reviewed and Nursing notes reviewed   Airway       Mallampati: IV    TM distance: <3 FB    Neck ROM: limited  Mouth Opening: fair.            Dental           (+) chipped, poor dentition           Pulmonary    Breath sounds clear to auscultation  (-) no rhonchi, no decreased breath sounds, no wheezes, no rales and no stridor     Cardiovascular    Rhythm: regular  Rate: Normal  (-) no friction rub, carotid bruit is not present, no peripheral edema and no murmur     Other findings            Plan  ASA 2     Planned anesthesia type: TIVA                 Intravenous induction     Anesthesia issues/risks discussed are: Dental Injuries, Aspiration, PONV, Intraoperative Awareness/ Recall, Stroke, Sore Throat, Cardiac Events/MI and Eye /Visual Loss.  Anesthetic plan and risks discussed with patient.          Patient's NPO status is appropriate for Anesthesia.           Plan discussed with physician and CRNA.    (Difficult airway)

## 2021-02-03 NOTE — OR PreOp (Signed)
COVID-19 Admission Screen    Low Risk:  None    Moderate/High Risk: {None    Test Result:Negative: Date: 01/31/2021

## 2021-02-03 NOTE — Discharge Instructions (Signed)
Colonoscopy discharge Provation instruction sheets given and reviewed.  Nurse Navigators (Nurse on Call)  Our nurses are available to assist you 24/7 at [phone]. These registered nurses provide immediate expert advice as a free service to you.  Consider calling the nurse if you feel ill, have questions about your condition, or are unsure if you should seek medical help.

## 2021-02-04 ENCOUNTER — Encounter (INDEPENDENT_AMBULATORY_CARE_PROVIDER_SITE_OTHER): Payer: Self-pay | Admitting: Family Medicine

## 2021-02-04 LAB — SURGICAL PATHOLOGY SPECIMEN

## 2021-02-05 ENCOUNTER — Telehealth (HOSPITAL_BASED_OUTPATIENT_CLINIC_OR_DEPARTMENT_OTHER): Payer: Self-pay | Admitting: Surgery

## 2021-02-05 ENCOUNTER — Other Ambulatory Visit (INDEPENDENT_AMBULATORY_CARE_PROVIDER_SITE_OTHER): Payer: Self-pay | Admitting: Family Medicine

## 2021-02-05 MED ORDER — LORAZEPAM 1 MG TABLET
1.0000 mg | ORAL_TABLET | Freq: Two times a day (BID) | ORAL | 0 refills | Status: DC | PRN
Start: 2021-02-05 — End: 2021-09-08

## 2021-02-05 NOTE — Telephone Encounter (Signed)
Attempted to call pt with results of testing.  Left message for pt to return call at their convenience to discuss results. Marthe Patch, LPN  6/00/4599, 77:41

## 2021-02-05 NOTE — Telephone Encounter (Signed)
-----   Message from Marguerita Beards, MD sent at 02/04/2021  4:35 PM EST -----  Please call patient with results.  Benign biopsy.  Repeat colonoscopy in 3 years

## 2021-02-11 ENCOUNTER — Telehealth (HOSPITAL_BASED_OUTPATIENT_CLINIC_OR_DEPARTMENT_OTHER): Payer: Self-pay | Admitting: Surgery

## 2021-02-11 NOTE — Telephone Encounter (Signed)
-----   Message from Marguerita Beards, MD sent at 02/04/2021  4:35 PM EST -----  Please call patient with results.  Benign biopsy.  Repeat colonoscopy in 3 years

## 2021-02-11 NOTE — Telephone Encounter (Signed)
Attempted to call pt with results of testing.  Left message for pt to return call at their convenience to discuss results.  Marthe Patch, LPN  7/50/5183, 35:82

## 2021-02-12 ENCOUNTER — Telehealth (HOSPITAL_BASED_OUTPATIENT_CLINIC_OR_DEPARTMENT_OTHER): Payer: Self-pay | Admitting: Surgery

## 2021-02-12 NOTE — Telephone Encounter (Addendum)
Patient notified of results and recall made.       Message from Marguerita Beards, MD sent at 02/04/2021  4:35 PM EST -----  Please call patient with results.  Benign biopsy.  Repeat colonoscopy in 3 years

## 2021-03-03 ENCOUNTER — Other Ambulatory Visit (INDEPENDENT_AMBULATORY_CARE_PROVIDER_SITE_OTHER): Payer: Self-pay | Admitting: Family Medicine

## 2021-03-14 ENCOUNTER — Other Ambulatory Visit (HOSPITAL_COMMUNITY): Payer: Self-pay | Admitting: Family Medicine

## 2021-04-21 ENCOUNTER — Other Ambulatory Visit (HOSPITAL_COMMUNITY): Payer: Self-pay | Admitting: Family Medicine

## 2021-04-21 ENCOUNTER — Encounter (INDEPENDENT_AMBULATORY_CARE_PROVIDER_SITE_OTHER): Payer: BC Managed Care – PPO | Admitting: Family Medicine

## 2021-04-21 ENCOUNTER — Other Ambulatory Visit (INDEPENDENT_AMBULATORY_CARE_PROVIDER_SITE_OTHER): Payer: Self-pay | Admitting: Family Medicine

## 2021-04-21 MED ORDER — FLUOXETINE 40 MG CAPSULE
40.0000 mg | ORAL_CAPSULE | Freq: Every day | ORAL | 3 refills | Status: DC
Start: 2021-04-21 — End: 2022-01-27

## 2021-04-21 MED ORDER — LANSOPRAZOLE 30 MG CAPSULE,DELAYED RELEASE
DELAYED_RELEASE_CAPSULE | ORAL | 5 refills | Status: DC
Start: 2021-04-21 — End: 2021-11-03

## 2021-04-21 MED ORDER — METOPROLOL SUCCINATE ER 25 MG TABLET,EXTENDED RELEASE 24 HR
ORAL_TABLET | ORAL | 5 refills | Status: DC
Start: 2021-04-21 — End: 2021-11-04

## 2021-04-21 NOTE — Telephone Encounter (Signed)
When I pulled this up, it states that this is not on his formulary list under his insurance, so will probably get PA request. Lady Gary, MA  04/21/2021, 11:09

## 2021-04-24 ENCOUNTER — Other Ambulatory Visit (INDEPENDENT_AMBULATORY_CARE_PROVIDER_SITE_OTHER): Payer: Self-pay | Admitting: Family Medicine

## 2021-05-05 ENCOUNTER — Other Ambulatory Visit (INDEPENDENT_AMBULATORY_CARE_PROVIDER_SITE_OTHER): Payer: Self-pay | Admitting: Family Medicine

## 2021-05-15 ENCOUNTER — Encounter (INDEPENDENT_AMBULATORY_CARE_PROVIDER_SITE_OTHER): Payer: BC Managed Care – PPO | Admitting: Family Medicine

## 2021-05-15 ENCOUNTER — Encounter (INDEPENDENT_AMBULATORY_CARE_PROVIDER_SITE_OTHER): Payer: Self-pay | Admitting: Family Medicine

## 2021-06-29 ENCOUNTER — Other Ambulatory Visit (INDEPENDENT_AMBULATORY_CARE_PROVIDER_SITE_OTHER): Payer: Self-pay | Admitting: Family Medicine

## 2021-06-30 ENCOUNTER — Encounter (INDEPENDENT_AMBULATORY_CARE_PROVIDER_SITE_OTHER): Payer: Self-pay | Admitting: Family Medicine

## 2021-07-03 ENCOUNTER — Other Ambulatory Visit (INDEPENDENT_AMBULATORY_CARE_PROVIDER_SITE_OTHER): Payer: Self-pay | Admitting: Family Medicine

## 2021-07-03 ENCOUNTER — Telehealth (INDEPENDENT_AMBULATORY_CARE_PROVIDER_SITE_OTHER): Payer: Self-pay | Admitting: Family Medicine

## 2021-07-03 MED ORDER — NALTREXONE ER 380 MG INTRAMUSCULAR SUSPENSION,EXTENDED RELEASE
380.0000 mg | INTRAMUSCULAR | 5 refills | Status: DC
Start: 2021-07-03 — End: 2021-07-07

## 2021-07-03 NOTE — Telephone Encounter (Signed)
Phone call from patients spouse requesting an Rx for Vivatrol Injection. Placed patient on schedule Monday at 2 pm. Please send Rx to [facility] in [place]. Thank you!

## 2021-07-03 NOTE — Telephone Encounter (Signed)
Discussed with patient 's spouse .

## 2021-07-03 NOTE — Telephone Encounter (Signed)
sent 

## 2021-07-07 ENCOUNTER — Ambulatory Visit (INDEPENDENT_AMBULATORY_CARE_PROVIDER_SITE_OTHER): Payer: BC Managed Care – PPO | Admitting: Family Medicine

## 2021-07-07 ENCOUNTER — Other Ambulatory Visit (INDEPENDENT_AMBULATORY_CARE_PROVIDER_SITE_OTHER): Payer: Self-pay | Admitting: Family Medicine

## 2021-07-07 ENCOUNTER — Other Ambulatory Visit: Payer: Self-pay

## 2021-07-07 ENCOUNTER — Encounter (INDEPENDENT_AMBULATORY_CARE_PROVIDER_SITE_OTHER): Payer: Self-pay | Admitting: Family Medicine

## 2021-07-07 VITALS — BP 127/90 | HR 64 | Resp 18 | Ht 71.0 in | Wt 231.0 lb

## 2021-07-07 DIAGNOSIS — E119 Type 2 diabetes mellitus without complications: Secondary | ICD-10-CM

## 2021-07-07 DIAGNOSIS — R7301 Impaired fasting glucose: Secondary | ICD-10-CM

## 2021-07-07 DIAGNOSIS — Z7984 Long term (current) use of oral hypoglycemic drugs: Secondary | ICD-10-CM

## 2021-07-07 DIAGNOSIS — F1021 Alcohol dependence, in remission: Secondary | ICD-10-CM

## 2021-07-07 DIAGNOSIS — I10 Essential (primary) hypertension: Secondary | ICD-10-CM

## 2021-07-07 MED ORDER — DIAZEPAM 5 MG TABLET
5.0000 mg | ORAL_TABLET | Freq: Two times a day (BID) | ORAL | 2 refills | Status: DC | PRN
Start: 2021-07-07 — End: 2021-10-08

## 2021-07-07 MED ORDER — LANCETS,THIN 23 GAUGE
3 refills | Status: DC
Start: 2021-07-07 — End: 2022-11-17

## 2021-07-07 MED ORDER — BLOOD SUGAR DIAGNOSTIC STRIPS
1.0000 | ORAL_STRIP | Freq: Two times a day (BID) | 3 refills | Status: DC
Start: 2021-07-07 — End: 2022-11-17

## 2021-07-07 MED ORDER — BLOOD-GLUCOSE METER
0 refills | Status: DC
Start: 2021-07-07 — End: 2021-08-11

## 2021-07-07 MED ORDER — NALTREXONE ER 380 MG INTRAMUSCULAR SUSPENSION,EXTENDED RELEASE
380.0000 mg | INTRAMUSCULAR | 5 refills | Status: DC
Start: 2021-07-07 — End: 2021-09-08

## 2021-07-07 NOTE — Nursing Note (Signed)
07/07/21 1407   Fall Risk Assessment   Do you feel unsteady when standing or walking? No   Do you worry about falling? No   Have you fallen in the past year? No

## 2021-07-07 NOTE — Nursing Note (Signed)
07/07/21 1407   Depression Screen   Little interest or pleasure in doing things. 0   Feeling down, depressed, or hopeless 0   PHQ 2 Total 0

## 2021-07-07 NOTE — Progress Notes (Signed)
[facility]  [address] [address]  Dept Phone: [phone]  Dept Fax: [phone]    Danny [name]  Jul 11, 1961  [id number]    Date of Service: 07/07/2021   Chief complaint:   Chief Complaint   Patient presents with   . Follow Up     Pt missed his last appt.   Anxiety,Depression, HTN, Hyperlipidemia, DM II   . Blood Work     Pt is not fasting       Subjective:   Patient presents today he recently was discharged from in inpatient treatment facility for alcohol abuse.  Patient was discharged 3 days ago.  Patient states that he is feels much better now that he is not drinking alcohol at all.  He does not have any major complaints he states his blood pressures been mildly elevated Leigh to diastolic number.  His blood sugars have been excellent.  He does not have any issues or concerns today.    Current Outpatient Medications   Medication Sig   . amLODIPine (NORVASC) 5 mg Oral Tablet TAKE 1 TABLET BY MOUTH EVERY DAY   . aspirin 81 mg Oral Tablet, Chewable Take 1 Tab (81 mg total) by mouth Once a day   . atorvastatin (LIPITOR) 80 mg Oral Tablet Take 1 Tablet (80 mg total) by mouth Every night   . Blood Sugar Diagnostic (ONETOUCH VERIO) Strip 1 Strip by Does not apply route Twice daily   . cholecalciferol, vitamin D3, 1,000 unit Oral Tablet Take 1,000 Units by mouth Once a day   . colesevelam (WELCHOL) 625 mg Oral Tablet TAKE 3 TABLETS BY MOUTH TWICE A DAY WITH FOOD   . cyanocobalamin (VITAMIN B 12) 1,000 mcg Oral Tablet Take 1,000 mcg by mouth Once a day   . diazePAM (VALIUM) 5 mg Oral Tablet Take 1 Tablet (5 mg total) by mouth Every 12 hours as needed for Anxiety   . diphenhydrAMINE (BENADRYL) 25 mg Oral Capsule Take 25 mg by mouth Every 6 hours as needed   . EPINEPHrine (EPIPEN) 0.3 mg/0.3 mL Injection Auto-Injector 0.3 mL (0.3 mg total) by Intramuscular route Once, as needed for up to 1 dose   . FLUoxetine (PROZAC) 40 mg Oral Capsule Take 1 Capsule (40 mg total) by mouth  Once a day   . fluticasone (FLONASE) 50 mcg/actuation Nasal Spray, Suspension 1 Spray by Each Nostril route Twice daily   . folic acid (FOLVITE) 1 mg Oral Tablet Take 1 mg by mouth Once a day   . glipiZIDE (GLUCOTROL) 5 mg Oral Tablet Take 5 mg by mouth Every morning before breakfast Take 30 minutes before meals   . Lancets Misc 1 Stick by Does not apply route Twice daily   . lansoprazole (PREVACID) 30 mg Oral Capsule, Delayed Release(E.C.) TAKE 1 CAPSULE BY MOUTH EVERY DAY Indications: gastroesophageal reflux disease   . loratadine (CLARITIN) 10 mg Oral Tablet Take 10 mg by mouth Once a day   . LORazepam (ATIVAN) 1 mg Oral Tablet Take 1 Tablet (1 mg total) by mouth Twice per day as needed for Anxiety   . losartan (COZAAR) 50 mg Oral Tablet TAKE 1 TABLET (50 MG TOTAL) BY MOUTH ONCE A DAY   . metoprolol succinate (TOPROL-XL) 25 mg Oral Tablet Sustained Release 24 hr TAKE 1 TABLET BY MOUTH EVERY DAY   . naltrexone microspheres (VIVITROL) 380 mg IntraMUSCULAR Suspension,Sust.Release Recon Inject 4 mL (380 mg total) into the muscle Every 28 days  DX - F10.9 Alcohol Use   . naphazoline-pheniramine (OPCON-A) 0.02675-0.315 % Ophthalmic Drops Instill 1 Drop into both eyes Once per day as needed   . ondansetron (ZOFRAN) 4 mg Oral Tablet Take 1 Tab (4 mg total) by mouth Every 8 hours as needed for nausea/vomiting   . PROAIR HFA 90 mcg/actuation Inhalation HFA Aerosol Inhaler Take 1-2 Puffs by inhalation Every 6 hours as needed       Objective:   BP (!) 127/90   Pulse 64   Resp 18   Ht 1.803 m (5\' 11" )   Wt 105 kg (231 lb)   SpO2 99%   BMI 32.22 kg/m       General appearance: alert, oriented x 3, in his normal state, cooperative, not in apparent distress, appearing stated age   HEENT:  Eyes:  Pupils equal round react like accommodation extraocular muscles intact. Ears within normal limits throat clear, tonsils normal, no cervical lymphadenopathy thyroid normal  Lungs: clear to auscultation bilaterally, respirations  non-labored  Heart: regular rate and rhythm, S1, S2 normal, no murmur, PMI non-diffuse  Abdomen: soft, non-tender. Bowel sounds normal.  Extremities: extremities normal, atraumatic, no cyanosis or edema, pulses intact in upper and lower extremities    Assessment     ENCOUNTER DIAGNOSES     ICD-10-CM   1. Alcohol use disorder, severe, in early remission (CMS HCC)  F10.21   2. Essential hypertension  I10   3. Type 2 diabetes mellitus without complication, without long-term current use of insulin (CMS HCC)  E11.9       Plan   No orders of the defined types were placed in this encounter.          Patient is to continue on all current medications other than those changed in the orders.     Overall patient seems to be doing well.  He seems committed to staying off of alcohol.  I want to continue to monitor his blood sugars and blood pressure.  Will have patient recheck here in 2 months and will do blood work at that time.    Nelda Severe Mel Almond, M.D.

## 2021-07-14 ENCOUNTER — Encounter (INDEPENDENT_AMBULATORY_CARE_PROVIDER_SITE_OTHER): Payer: BC Managed Care – PPO | Admitting: Family Medicine

## 2021-07-15 ENCOUNTER — Other Ambulatory Visit: Payer: Self-pay

## 2021-07-22 ENCOUNTER — Ambulatory Visit (INDEPENDENT_AMBULATORY_CARE_PROVIDER_SITE_OTHER): Payer: BC Managed Care – PPO | Admitting: Social Worker

## 2021-07-29 ENCOUNTER — Encounter (INDEPENDENT_AMBULATORY_CARE_PROVIDER_SITE_OTHER): Payer: Self-pay | Admitting: Family Medicine

## 2021-08-03 ENCOUNTER — Other Ambulatory Visit (INDEPENDENT_AMBULATORY_CARE_PROVIDER_SITE_OTHER): Payer: Self-pay | Admitting: Family Medicine

## 2021-08-09 ENCOUNTER — Other Ambulatory Visit (INDEPENDENT_AMBULATORY_CARE_PROVIDER_SITE_OTHER): Payer: Self-pay | Admitting: Family Medicine

## 2021-08-11 ENCOUNTER — Other Ambulatory Visit (INDEPENDENT_AMBULATORY_CARE_PROVIDER_SITE_OTHER): Payer: Self-pay | Admitting: Family Medicine

## 2021-08-21 ENCOUNTER — Encounter (INDEPENDENT_AMBULATORY_CARE_PROVIDER_SITE_OTHER): Payer: Self-pay | Admitting: Family Medicine

## 2021-08-21 ENCOUNTER — Other Ambulatory Visit (INDEPENDENT_AMBULATORY_CARE_PROVIDER_SITE_OTHER): Payer: Self-pay | Admitting: Family Medicine

## 2021-08-21 ENCOUNTER — Telehealth (INDEPENDENT_AMBULATORY_CARE_PROVIDER_SITE_OTHER): Payer: Self-pay | Admitting: Family Medicine

## 2021-08-21 MED ORDER — NIRMATRELVIR 300 MG (150 MG X2)-RITONAVIR 100 MG TABLET,DOSE PACK
3.0000 | ORAL_TABLET | Freq: Two times a day (BID) | ORAL | 0 refills | Status: AC
Start: 2021-08-21 — End: 2021-08-26

## 2021-08-21 NOTE — Telephone Encounter (Signed)
[facility] called to arrange shipment of patient's Vivatrol. It is a control substance, so it has to come to doctor's office for insurance to pay. Lady Gary, MA  08/21/2021, 09:05

## 2021-09-04 ENCOUNTER — Other Ambulatory Visit (INDEPENDENT_AMBULATORY_CARE_PROVIDER_SITE_OTHER): Payer: Self-pay | Admitting: Family Medicine

## 2021-09-04 DIAGNOSIS — R5383 Other fatigue: Secondary | ICD-10-CM

## 2021-09-04 MED ORDER — DISULFIRAM 250 MG TABLET
250.0000 mg | ORAL_TABLET | Freq: Every day | ORAL | 2 refills | Status: DC
Start: 2021-09-04 — End: 2021-09-29

## 2021-09-08 ENCOUNTER — Encounter (INDEPENDENT_AMBULATORY_CARE_PROVIDER_SITE_OTHER): Payer: Self-pay | Admitting: Family Medicine

## 2021-09-08 ENCOUNTER — Ambulatory Visit (INDEPENDENT_AMBULATORY_CARE_PROVIDER_SITE_OTHER): Payer: BC Managed Care – PPO

## 2021-09-08 ENCOUNTER — Ambulatory Visit (INDEPENDENT_AMBULATORY_CARE_PROVIDER_SITE_OTHER): Payer: BC Managed Care – PPO | Admitting: Family Medicine

## 2021-09-08 ENCOUNTER — Encounter (INDEPENDENT_AMBULATORY_CARE_PROVIDER_SITE_OTHER): Payer: BC Managed Care – PPO | Admitting: Family Medicine

## 2021-09-08 ENCOUNTER — Other Ambulatory Visit: Payer: Self-pay

## 2021-09-08 ENCOUNTER — Other Ambulatory Visit: Payer: BC Managed Care – PPO | Attending: Family Medicine | Admitting: Family Medicine

## 2021-09-08 VITALS — BP 124/72 | HR 83 | Resp 18 | Ht 71.0 in | Wt 231.0 lb

## 2021-09-08 DIAGNOSIS — E119 Type 2 diabetes mellitus without complications: Secondary | ICD-10-CM

## 2021-09-08 DIAGNOSIS — I10 Essential (primary) hypertension: Secondary | ICD-10-CM

## 2021-09-08 DIAGNOSIS — F1010 Alcohol abuse, uncomplicated: Secondary | ICD-10-CM

## 2021-09-08 DIAGNOSIS — Z7984 Long term (current) use of oral hypoglycemic drugs: Secondary | ICD-10-CM

## 2021-09-08 DIAGNOSIS — R5383 Other fatigue: Secondary | ICD-10-CM | POA: Insufficient documentation

## 2021-09-08 LAB — COMPREHENSIVE METABOLIC PANEL, NON-FASTING
ALBUMIN: 4.6 g/dL (ref 3.2–4.8)
ALKALINE PHOSPHATASE: 81 U/L (ref 20–130)
ALT (SGPT): 40 U/L (ref <=52)
ANION GAP: 14 mmol/L
AST (SGOT): 30 U/L (ref <=35)
BILIRUBIN TOTAL: 0.6 mg/dL (ref 0.3–1.2)
BUN/CREA RATIO: 7
BUN: 7 mg/dL — ABNORMAL LOW (ref 10–25)
CALCIUM: 9.8 mg/dL (ref 8.8–10.3)
CHLORIDE: 91 mmol/L — ABNORMAL LOW (ref 98–111)
CO2 TOTAL: 26 mmol/L (ref 21–35)
CREATININE: 0.98 mg/dL (ref <=1.30)
ESTIMATED GFR: >60 mL/min/{1.73_m2}
GLUCOSE: 102 mg/dL (ref 70–110)
POTASSIUM: 4.6 mmol/L (ref 3.5–5.0)
PROTEIN TOTAL: 7.8 g/dL (ref 6.0–8.3)
SODIUM: 131 mmol/L — ABNORMAL LOW (ref 135–145)

## 2021-09-08 LAB — CBC
HCT: 46.7 % (ref 38.9–52.0)
HGB: 15.7 g/dL (ref 13.4–17.5)
MCH: 31.8 pg (ref 26.0–32.0)
MCHC: 33.6 g/dL (ref 31.0–35.5)
MCV: 94.7 fL (ref 78.0–100.0)
MPV: 9.9 fL (ref 8.7–12.5)
PLATELETS: 398 10*3/uL (ref 150–400)
RBC: 4.93 10*6/uL (ref 4.50–6.10)
RDW-CV: 12.9 % (ref 11.5–15.5)
WBC: 9.5 10*3/uL (ref 3.7–11.0)

## 2021-09-08 LAB — THYROID STIMULATING HORMONE (SENSITIVE TSH): TSH: 3.016 u[IU]/mL (ref 0.450–5.330)

## 2021-09-08 MED ORDER — NALTREXONE ER 380 MG INTRAMUSCULAR SUSPENSION,EXTENDED RELEASE
380.0000 mg | INTRAMUSCULAR | 5 refills | Status: DC
Start: 2021-09-08 — End: 2021-10-27

## 2021-09-08 MED ORDER — NALTREXONE ER 380 MG INTRAMUSCULAR SUSPENSION,EXTENDED RELEASE
380.0000 mg | Freq: Once | INTRAMUSCULAR | Status: AC
Start: 2021-09-08 — End: 2021-09-08
  Administered 2021-09-08 (×2): 380 mg via INTRAMUSCULAR

## 2021-09-08 NOTE — Progress Notes (Signed)
[facility]  [address] [place] [address]  Dept Phone: [phone]  Dept Fax: [phone]    Danny [name]  1961-05-06  [id number]    Date of Service: 09/08/2021   Chief complaint:   Chief Complaint   Patient presents with   . Follow Up     2 month follow up  Anxiety,Depression, HTN, Hyperlipidemia, DM II   . Alcohol Dependence     Follow up on Alcohol disorder    . Blood Work     Pt isn't fasting but wife wants him to have lab work today - Needs HA1C , Microalbumin ,  wife wants Thyroid and testosterone checked        Subjective:   Patient presents today for follow-up after recent rehab hospital stay due to alcohol dependence.  Patient had been in rehab in a done very well and had a relapse so went back to inpatient rehab.  He was released yesterday and wants to get back on his Vivitrol.  Patient states he seems more committed to abstaining from alcohol.  Far as his blood sugars have been a little erratic but overall been doing fairly well.  Patient denies any chest pain or shortness of breath.    Current Outpatient Medications   Medication Sig   . amLODIPine (NORVASC) 5 mg Oral Tablet TAKE 1 TABLET BY MOUTH EVERY DAY   . aspirin 81 mg Oral Tablet, Chewable Take 1 Tab (81 mg total) by mouth Once a day   . atorvastatin (LIPITOR) 80 mg Oral Tablet Take 1 Tablet (80 mg total) by mouth Every night (Patient not taking: Reported on 09/08/2021)   . Blood Sugar Diagnostic (ONETOUCH VERIO TEST STRIPS) Strip 1 Strip Twice daily   . Blood-Glucose Meter (ONETOUCH VERIO FLEX METER) Misc USE TO TEST GLUCOSE TWICE A DAY DX E11.9   . cholecalciferol, vitamin D3, 1,000 unit Oral Tablet Take 1,000 Units by mouth Once a day   . colesevelam (WELCHOL) 625 mg Oral Tablet TAKE 3 TABLETS BY MOUTH TWICE A DAY WITH FOOD (Patient not taking: Reported on 09/08/2021)   . DAILY-VITE Oral Tablet TAKE 1 TABLET BY MOUTH EVERY DAY   . diazePAM (VALIUM) 5 mg Oral Tablet Take 1 Tablet (5 mg total) by mouth Every 12  hours as needed for Anxiety   . diphenhydrAMINE (BENADRYL) 25 mg Oral Capsule Take 25 mg by mouth Every 6 hours as needed   . disulfiram (ANTABUSE) 250 mg Oral Tablet Take 1 Tablet (250 mg total) by mouth Once a day for 90 days   . EPINEPHrine (EPIPEN) 0.3 mg/0.3 mL Injection Auto-Injector 0.3 mL (0.3 mg total) by Intramuscular route Once, as needed for up to 1 dose   . FLUoxetine (PROZAC) 40 mg Oral Capsule Take 1 Capsule (40 mg total) by mouth Once a day   . fluticasone (FLONASE) 50 mcg/actuation Nasal Spray, Suspension 1 Spray by Each Nostril route Twice daily   . folic acid (FOLVITE) 1 mg Oral Tablet TAKE 1 TABLET BY MOUTH EVERY DAY   . glipiZIDE (GLUCOTROL) 5 mg Oral Tablet Take 5 mg by mouth Every morning before breakfast Take 30 minutes before meals (Patient not taking: Reported on 09/08/2021)   . lancets (LANCETS,THIN) 23 gauge Misc TEST GLUCOSE TWICE A DAY DX-E11.9   . lansoprazole (PREVACID) 30 mg Oral Capsule, Delayed Release(E.C.) TAKE 1 CAPSULE BY MOUTH EVERY DAY Indications: gastroesophageal reflux disease   . loratadine (CLARITIN) 10 mg Oral Tablet Take 10  mg by mouth Once a day   . LORazepam (ATIVAN) 1 mg Oral Tablet Take 1 Tablet (1 mg total) by mouth Twice per day as needed for Anxiety   . losartan (COZAAR) 50 mg Oral Tablet TAKE 1 TABLET (50 MG TOTAL) BY MOUTH ONCE A DAY   . metoprolol succinate (TOPROL-XL) 25 mg Oral Tablet Sustained Release 24 hr TAKE 1 TABLET BY MOUTH EVERY DAY   . naltrexone microspheres (VIVITROL) 380 mg IntraMUSCULAR Suspension,Sust.Release Recon Inject 4 mL (380 mg total) into the muscle Every 28 days DX - F10.9 Alcohol Use   . naphazoline-pheniramine (OPCON-A) 0.02675-0.315 % Ophthalmic Drops Instill 1 Drop into both eyes Once per day as needed   . ondansetron (ZOFRAN) 4 mg Oral Tablet Take 1 Tab (4 mg total) by mouth Every 8 hours as needed for nausea/vomiting   . PROAIR HFA 90 mcg/actuation Inhalation HFA Aerosol Inhaler Take 1-2 Puffs by inhalation Every 6 hours as  needed   . thiamine (VITAMIN B1) 100 mg Oral Tablet TAKE 1 TABLET BY MOUTH EVERY DAY       Objective:   BP 124/72   Pulse 83   Resp 18   Ht 1.803 m ('5\' 11"'$ )   Wt 105 kg (231 lb)   SpO2 97%   BMI 32.22 kg/m       General appearance: alert, oriented x 3, in his normal state, cooperative, not in apparent distress, appearing stated age   HEENT:  Eyes:  Pupils equal round react like accommodation extraocular muscles intact. Ears within normal limits throat clear, tonsils normal, no cervical lymphadenopathy thyroid normal  Lungs: clear to auscultation bilaterally, respirations non-labored  Heart: regular rate and rhythm, S1, S2 normal, no murmur, PMI non-diffuse  Abdomen: soft, non-tender. Bowel sounds normal.  Extremities: extremities normal, atraumatic, no cyanosis or edema, pulses intact in upper and lower extremities    Assessment     ENCOUNTER DIAGNOSES     ICD-10-CM   1. Alcohol abuse  F10.10   2. Type 2 diabetes mellitus without complication, without long-term current use of insulin (CMS HCC)  E11.9   3. Essential hypertension  I10       Plan     Orders Placed This Encounter   . CBC   . COMPREHENSIVE METABOLIC PANEL, NON-FASTING   . naltrexone microspheres (VIVITROL) 380 mg IntraMUSCULAR Suspension,Sust.Release Recon   . naltrexone ER (VIVITROL) 380 mg IM injection           Patient is to continue on all current medications other than those changed in the orders.     Overall patient seems to be doing well will get him back on his Vivitrol and will repeat lab work at his next appointment.  Continue to check blood sugars let me know if he has any other issues.    Nelda Severe Mel Almond, M.D.

## 2021-09-13 LAB — TESTOSTERONE, TOTAL, BIOAVAILABLE, AND FREE WITH SHBG, SERUM
ALBUMIN: 4.8 g/dL (ref 3.6–5.1)
SEX HORMONE BINDING GLOB.: 70 nmol/L (ref 22–77)
TESTOSTERONE, BIOAVAILABLE: 101.8 ng/dL — ABNORMAL LOW (ref 110.0–575.0)
TESTOSTERONE, FREE: 46.6 pg/mL (ref 46.0–224.0)
TESTOSTERONE,TOTAL,LC/MS/MS: 663 ng/dL (ref 250–1100)

## 2021-09-14 ENCOUNTER — Other Ambulatory Visit (INDEPENDENT_AMBULATORY_CARE_PROVIDER_SITE_OTHER): Payer: Self-pay | Admitting: Family Medicine

## 2021-09-22 ENCOUNTER — Telehealth (INDEPENDENT_AMBULATORY_CARE_PROVIDER_SITE_OTHER): Payer: Self-pay | Admitting: Family Medicine

## 2021-09-22 NOTE — Telephone Encounter (Signed)
[facility] for his Vivatrol called and states that F10.9 will not cover medication. I called them back at [phone] and gave them F10.10 or F10.20. They will run these through and then call us back. Lady Gary, MA  09/22/2021, 09:39

## 2021-09-23 ENCOUNTER — Encounter (INDEPENDENT_AMBULATORY_CARE_PROVIDER_SITE_OTHER): Payer: Self-pay | Admitting: Family Medicine

## 2021-09-23 ENCOUNTER — Encounter (INDEPENDENT_AMBULATORY_CARE_PROVIDER_SITE_OTHER): Payer: BC Managed Care – PPO | Admitting: Family Medicine

## 2021-09-25 ENCOUNTER — Encounter (INDEPENDENT_AMBULATORY_CARE_PROVIDER_SITE_OTHER): Payer: BC Managed Care – PPO | Admitting: Family Medicine

## 2021-09-29 ENCOUNTER — Other Ambulatory Visit (INDEPENDENT_AMBULATORY_CARE_PROVIDER_SITE_OTHER): Payer: Self-pay | Admitting: Family Medicine

## 2021-09-29 NOTE — Telephone Encounter (Signed)
Dr Mel Almond is pt taking this now ?  Looks like he is still doing the Vivitrol . Lincoln Brigham, LPN  35/36/1443, 15:40

## 2021-10-02 ENCOUNTER — Encounter (INDEPENDENT_AMBULATORY_CARE_PROVIDER_SITE_OTHER): Payer: Self-pay | Admitting: Family Medicine

## 2021-10-06 ENCOUNTER — Encounter (INDEPENDENT_AMBULATORY_CARE_PROVIDER_SITE_OTHER): Payer: Self-pay | Admitting: Family Medicine

## 2021-10-08 ENCOUNTER — Other Ambulatory Visit: Payer: BC Managed Care – PPO | Attending: Family Medicine | Admitting: Family Medicine

## 2021-10-08 ENCOUNTER — Encounter (INDEPENDENT_AMBULATORY_CARE_PROVIDER_SITE_OTHER): Payer: Self-pay | Admitting: Family Medicine

## 2021-10-08 ENCOUNTER — Other Ambulatory Visit: Payer: Self-pay

## 2021-10-08 ENCOUNTER — Ambulatory Visit (INDEPENDENT_AMBULATORY_CARE_PROVIDER_SITE_OTHER): Payer: BC Managed Care – PPO | Admitting: Family Medicine

## 2021-10-08 VITALS — BP 130/84 | HR 105 | Resp 20 | Ht 70.98 in | Wt 231.0 lb

## 2021-10-08 DIAGNOSIS — F1021 Alcohol dependence, in remission: Secondary | ICD-10-CM

## 2021-10-08 DIAGNOSIS — E119 Type 2 diabetes mellitus without complications: Secondary | ICD-10-CM

## 2021-10-08 DIAGNOSIS — R300 Dysuria: Secondary | ICD-10-CM

## 2021-10-08 DIAGNOSIS — I10 Essential (primary) hypertension: Secondary | ICD-10-CM

## 2021-10-08 LAB — POCT URINE DIPSTICK
PH: 6.5
PROTEIN: 0.2
SPECIFIC GRAVITY: 1.005

## 2021-10-08 LAB — POCT EAST HGB A1C (AMB): POCT HGB A1C: 6.3 % — AB (ref 4–6)

## 2021-10-08 MED ORDER — DIAZEPAM 5 MG TABLET
5.0000 mg | ORAL_TABLET | Freq: Two times a day (BID) | ORAL | 2 refills | Status: DC | PRN
Start: 2021-10-08 — End: 2022-03-20

## 2021-10-08 MED ORDER — DISULFIRAM 250 MG TABLET
250.0000 mg | ORAL_TABLET | Freq: Every day | ORAL | 0 refills | Status: AC
Start: 2021-10-08 — End: 2022-01-06

## 2021-10-08 MED ORDER — CIPROFLOXACIN 500 MG TABLET
500.0000 mg | ORAL_TABLET | Freq: Two times a day (BID) | ORAL | 0 refills | Status: DC
Start: 2021-10-08 — End: 2021-10-27

## 2021-10-08 NOTE — Nursing Note (Signed)
10/08/21 1100   Urine test  (Siemens Multistix 10 SG)   Time collected 1141   Color (Ref Range: Yellow) Yellow   Clarity (Ref Range: Clear) Clear   Glucose (Ref Range: Negative mg/dL) Negative   Bilirubin (Ref Range: Negative mg/dL) Negative   Ketones (Ref Range: Negative mg/dL) Negative   Urine Specific Gravity (Ref Range: 1.005 - 1.030) 1.005   Blood (urine) (Ref Range: Negative mg/dL) (!) Small (1+)   pH (Ref Range: 5.0 - 8.0) 6.5   Protein (Ref Range: Negative mg/dL) Negative   Urobilinogen (Ref Range: Negative mg/dL) 0.2mg /dL (Normal)   Nitrite (Ref Range: Negative) Negative   Leukocytes (Ref Range: Negative WBC's/uL) Negative   Performed Status Manual   Bottle Number   (Siemens Multistix 10 SG) [id number]   Lot # [id number]   Expiration Date 07/20/22   Initials cb   A1C   Time Performed 1139   References Ranges 4 - 6%   A1C Lot # [id number]   Expiration Date 05/14/23   Internal Control Valid yes   Initials cb   Lancet used on  Finger

## 2021-10-08 NOTE — Progress Notes (Signed)
[facility]  [address] [place] [address]  Dept Phone: [phone]  Dept Fax: [phone]    Danny [name]  1961-09-29  [id number]    Date of Service: 10/08/2021   Chief complaint:   Chief Complaint   Patient presents with   . Follow Up     Follow up on alcohol abuse , pt wants to know if he needs to increase his antabuse    . Blood Work     Pt had dt pepsi and unsweet ice tea  needs HA1C and Microalbumin    . Eye Problem     Pt wants you to look at his right eye , possibly sty   . Urinary Pain     Burning with urination        Subjective:   Patient presents today for an acute visit mainly to discuss his alcohol abuse disorder.  Patient also has diabetes and has been having some urinary symptoms.  He also wants to that the discuss his anxiety and hypertension.  Overall patient has been through alcohol rehab multiple times he just got out about a week ago where he had been in inpatient rehab for 2 weeks.  He is continuing to consume beer on a regular basis.  He is please that he is not drinking as much as he was previously and not drinking alcohol wine or any other liquor but he is consuming beer on a daily basis.    Current Outpatient Medications   Medication Sig   . amLODIPine (NORVASC) 5 mg Oral Tablet TAKE 1 TABLET BY MOUTH EVERY DAY   . aspirin 81 mg Oral Tablet, Chewable Take 1 Tab (81 mg total) by mouth Once a day   . atorvastatin (LIPITOR) 80 mg Oral Tablet Take 1 Tablet (80 mg total) by mouth Every night (Patient not taking: No sig reported)   . Blood Sugar Diagnostic (ONETOUCH VERIO TEST STRIPS) Strip 1 Strip Twice daily   . Blood-Glucose Meter (ONETOUCH VERIO FLEX METER) Misc USE TO TEST GLUCOSE TWICE A DAY DX E11.9   . cholecalciferol, vitamin D3, 1,000 unit Oral Tablet Take 1,000 Units by mouth Once a day   . ciprofloxacin HCl (CIPRO) 500 mg Oral Tablet Take 1 Tablet (500 mg total) by mouth Twice daily   . colesevelam (WELCHOL) 625 mg Oral Tablet TAKE 3 TABLETS BY  MOUTH TWICE A DAY WITH FOOD (Patient not taking: No sig reported)   . DAILY-VITE Oral Tablet TAKE 1 TABLET BY MOUTH EVERY DAY   . diazePAM (VALIUM) 5 mg Oral Tablet Take 1 Tablet (5 mg total) by mouth Every 12 hours as needed for Anxiety   . diphenhydrAMINE (BENADRYL) 25 mg Oral Capsule Take 25 mg by mouth Every 6 hours as needed   . disulfiram (ANTABUSE) 250 mg Oral Tablet Take 1 Tablet (250 mg total) by mouth Once a day for 90 days   . EPINEPHrine (EPIPEN) 0.3 mg/0.3 mL Injection Auto-Injector 0.3 mL (0.3 mg total) by Intramuscular route Once, as needed for up to 1 dose   . FLUoxetine (PROZAC) 40 mg Oral Capsule Take 1 Capsule (40 mg total) by mouth Once a day   . fluticasone (FLONASE) 50 mcg/actuation Nasal Spray, Suspension 1 Spray by Each Nostril route Twice daily   . folic acid (FOLVITE) 1 mg Oral Tablet TAKE 1 TABLET BY MOUTH EVERY DAY   . glipiZIDE (GLUCOTROL) 5 mg Oral Tablet Take 1 Tablet (5 mg total) by mouth  Every morning before breakfast Take 30 minutes before meals   . lancets (LANCETS,THIN) 23 gauge Misc TEST GLUCOSE TWICE A DAY DX-E11.9   . lansoprazole (PREVACID) 30 mg Oral Capsule, Delayed Release(E.C.) TAKE 1 CAPSULE BY MOUTH EVERY DAY Indications: gastroesophageal reflux disease   . loratadine (CLARITIN) 10 mg Oral Tablet Take 10 mg by mouth Once a day   . losartan (COZAAR) 50 mg Oral Tablet TAKE 1 TABLET (50 MG TOTAL) BY MOUTH ONCE A DAY   . metoprolol succinate (TOPROL-XL) 25 mg Oral Tablet Sustained Release 24 hr TAKE 1 TABLET BY MOUTH EVERY DAY   . naltrexone microspheres (VIVITROL) 380 mg IntraMUSCULAR Suspension,Sust.Release Recon Inject 4 mL (380 mg total) into the muscle Every 28 days DX - F10.9 Alcohol Use   . naphazoline-pheniramine (OPCON-A) 0.02675-0.315 % Ophthalmic Drops Instill 1 Drop into both eyes Once per day as needed   . ondansetron (ZOFRAN) 4 mg Oral Tablet Take 1 Tab (4 mg total) by mouth Every 8 hours as needed for nausea/vomiting   . PROAIR HFA 90 mcg/actuation Inhalation  HFA Aerosol Inhaler Take 1-2 Puffs by inhalation Every 6 hours as needed   . thiamine (VITAMIN B1) 100 mg Oral Tablet TAKE 1 TABLET BY MOUTH EVERY DAY       Objective:   BP 130/84   Pulse (!) 105   Resp 20   Ht 1.803 m (5' 10.98")   Wt 105 kg (231 lb)   SpO2 97%   BMI 32.23 kg/m       General appearance: alert, oriented x 3, in his normal state, cooperative, not in apparent distress, appearing stated age   60:  Eyes:  Pupils equal round react like accommodation extraocular muscles intact. Ears within normal limits throat clear, tonsils normal, no cervical lymphadenopathy thyroid normal  Lungs: clear to auscultation bilaterally, respirations non-labored  Heart: regular rate and rhythm, S1, S2 normal, no murmur, PMI non-diffuse  Abdomen: soft, non-tender. Bowel sounds normal.  Extremities: extremities normal, atraumatic, no cyanosis or edema, pulses intact in upper and lower extremities    Assessment     ENCOUNTER DIAGNOSES     ICD-10-CM   1. Alcohol use disorder, severe, in early remission (CMS HCC)  F10.21   2. Type 2 diabetes mellitus without complication, without long-term current use of insulin (CMS HCC)  E11.9   3. Dysuria  R30.0   4. Essential hypertension  I10       Plan     Orders Placed This Encounter   . URINE CULTURE   . POCT EAST HGB A1C (AMB)   . POCT URINE DIPSTICK   . disulfiram (ANTABUSE) 250 mg Oral Tablet   . ciprofloxacin HCl (CIPRO) 500 mg Oral Tablet           Patient is to continue on all current medications other than those changed in the orders.     As far as his alcohol abuse disorder the patient is very intelligent and he is aware that he needs to completely does stop his alcohol intake but at this point has not been able to accomplish this.  He is going to continue taking his Valium twice a day as needed.  He was to go back on his Antabuse.  He is having some urinary symptoms and had some blood in the urine going to do a culture go ahead and start on an antibiotic.  Will have  patient recheck here in 1 month sooner for problems.    Nelda Severe  Mel Almond, M.D.

## 2021-10-08 NOTE — Nursing Note (Signed)
10/08/21 1100   Urine test  (Siemens Multistix 10 SG)   Time collected 1141   Color (Ref Range: Yellow) Yellow   Clarity (Ref Range: Clear) Clear   Glucose (Ref Range: Negative mg/dL) Negative   Bilirubin (Ref Range: Negative mg/dL) Negative   Ketones (Ref Range: Negative mg/dL) Negative   Urine Specific Gravity (Ref Range: 1.005 - 1.030) 1.005   Blood (urine) (Ref Range: Negative mg/dL) (!) Small (1+)   pH (Ref Range: 5.0 - 8.0) 6.5   Protein (Ref Range: Negative mg/dL) Negative   Urobilinogen (Ref Range: Negative mg/dL) 0.2mg /dL (Normal)   Nitrite (Ref Range: Negative) Negative   Leukocytes (Ref Range: Negative WBC's/uL) Negative   Performed Status Manual   Bottle Number   (Siemens Multistix 10 SG) 2161   Lot # [id number]   Expiration Date 07/20/22   Initials cb   A1C   Time Performed 1139   A1C 6.3   References Ranges 4 - 6%   A1C Lot # [id number]   Expiration Date 05/14/23   Internal Control Valid yes   Initials cb   Lancet used on  Finger

## 2021-10-11 LAB — URINE CULTURE: URINE CULTURE: NO GROWTH

## 2021-10-21 ENCOUNTER — Other Ambulatory Visit (INDEPENDENT_AMBULATORY_CARE_PROVIDER_SITE_OTHER): Payer: Self-pay

## 2021-10-27 ENCOUNTER — Ambulatory Visit (INDEPENDENT_AMBULATORY_CARE_PROVIDER_SITE_OTHER): Payer: BC Managed Care – PPO | Admitting: Family Medicine

## 2021-10-27 ENCOUNTER — Other Ambulatory Visit: Payer: Self-pay

## 2021-10-27 ENCOUNTER — Encounter (INDEPENDENT_AMBULATORY_CARE_PROVIDER_SITE_OTHER): Payer: Self-pay | Admitting: Family Medicine

## 2021-10-27 VITALS — BP 160/100 | HR 80 | Resp 18 | Ht 71.0 in | Wt 235.0 lb

## 2021-10-27 DIAGNOSIS — F902 Attention-deficit hyperactivity disorder, combined type: Secondary | ICD-10-CM

## 2021-10-27 DIAGNOSIS — I10 Essential (primary) hypertension: Secondary | ICD-10-CM

## 2021-10-27 DIAGNOSIS — F1021 Alcohol dependence, in remission: Secondary | ICD-10-CM

## 2021-10-27 DIAGNOSIS — E119 Type 2 diabetes mellitus without complications: Secondary | ICD-10-CM

## 2021-10-27 MED ORDER — LISDEXAMFETAMINE 20 MG CAPSULE
20.0000 mg | ORAL_CAPSULE | Freq: Every day | ORAL | 0 refills | Status: DC
Start: 2021-10-27 — End: 2021-11-28

## 2021-10-27 MED ORDER — NALTREXONE ER 380 MG INTRAMUSCULAR SUSPENSION,EXTENDED RELEASE
380.0000 mg | INTRAMUSCULAR | 5 refills | Status: DC
Start: 2021-10-27 — End: 2022-02-17

## 2021-10-27 NOTE — Progress Notes (Signed)
[facility]  [address] [place] [address]  Dept Phone: [phone]  Dept Fax: [phone]    Danny [name]  July 21, 1961  [id number]    Date of Service: 10/27/2021   Chief complaint:   Chief Complaint   Patient presents with   . Other     Pt would like to discuss medications.   . Blood Work     Pt is not fasting       Subjective:   Patient presents today for an acute visit to discuss some issues he is having.  Patient has long history of alcohol abuse and has been struggling with this he has been in rehab multiple times over the last several months.  Patient states that he feels that he is very anxious all the time his wife was present during the exam also states that she feels that he is having issues of attention deficit disorder.  The patient would like to try medication for this.    Current Outpatient Medications   Medication Sig   . amLODIPine (NORVASC) 5 mg Oral Tablet TAKE 1 TABLET BY MOUTH EVERY DAY   . aspirin 81 mg Oral Tablet, Chewable Take 1 Tab (81 mg total) by mouth Once a day   . atorvastatin (LIPITOR) 80 mg Oral Tablet Take 1 Tablet (80 mg total) by mouth Every night   . Blood Sugar Diagnostic (ONETOUCH VERIO TEST STRIPS) Strip 1 Strip Twice daily   . Blood-Glucose Meter (ONETOUCH VERIO FLEX METER) Misc USE TO TEST GLUCOSE TWICE A DAY DX E11.9   . cholecalciferol, vitamin D3, 1,000 unit Oral Tablet Take 1,000 Units by mouth Once a day   . colesevelam (WELCHOL) 625 mg Oral Tablet TAKE 3 TABLETS BY MOUTH TWICE A DAY WITH FOOD   . DAILY-VITE Oral Tablet TAKE 1 TABLET BY MOUTH EVERY DAY   . diazePAM (VALIUM) 5 mg Oral Tablet Take 1 Tablet (5 mg total) by mouth Every 12 hours as needed for Anxiety   . diphenhydrAMINE (BENADRYL) 25 mg Oral Capsule Take 25 mg by mouth Every 6 hours as needed   . disulfiram (ANTABUSE) 250 mg Oral Tablet Take 1 Tablet (250 mg total) by mouth Once a day for 90 days   . EPINEPHrine (EPIPEN) 0.3 mg/0.3 mL Injection Auto-Injector 0.3 mL (0.3 mg  total) by Intramuscular route Once, as needed for up to 1 dose   . FLUoxetine (PROZAC) 40 mg Oral Capsule Take 1 Capsule (40 mg total) by mouth Once a day   . fluticasone (FLONASE) 50 mcg/actuation Nasal Spray, Suspension 1 Spray by Each Nostril route Twice daily   . folic acid (FOLVITE) 1 mg Oral Tablet TAKE 1 TABLET BY MOUTH EVERY DAY   . glipiZIDE (GLUCOTROL) 5 mg Oral Tablet Take 1 Tablet (5 mg total) by mouth Every morning before breakfast Take 30 minutes before meals   . lancets (LANCETS,THIN) 23 gauge Misc TEST GLUCOSE TWICE A DAY DX-E11.9   . lansoprazole (PREVACID) 30 mg Oral Capsule, Delayed Release(E.C.) TAKE 1 CAPSULE BY MOUTH EVERY DAY Indications: gastroesophageal reflux disease   . lisdexamfetamine (VYVANSE) 20 mg Oral Capsule Take 1 Capsule (20 mg total) by mouth Once a day for 30 days   . loratadine (CLARITIN) 10 mg Oral Tablet Take 10 mg by mouth Once a day   . losartan (COZAAR) 50 mg Oral Tablet TAKE 1 TABLET (50 MG TOTAL) BY MOUTH ONCE A DAY   . metoprolol succinate (TOPROL-XL) 25 mg Oral  Tablet Sustained Release 24 hr TAKE 1 TABLET BY MOUTH EVERY DAY   . naltrexone microspheres (VIVITROL) 380 mg IntraMUSCULAR Suspension,Sust.Release Recon Inject 4 mL (380 mg total) into the muscle Every 28 days DX - F10.9 Alcohol Use   . naphazoline-pheniramine (OPCON-A) 0.02675-0.315 % Ophthalmic Drops Instill 1 Drop into both eyes Once per day as needed   . ondansetron (ZOFRAN) 4 mg Oral Tablet Take 1 Tab (4 mg total) by mouth Every 8 hours as needed for nausea/vomiting   . PROAIR HFA 90 mcg/actuation Inhalation HFA Aerosol Inhaler Take 1-2 Puffs by inhalation Every 6 hours as needed   . thiamine (VITAMIN B1) 100 mg Oral Tablet TAKE 1 TABLET BY MOUTH EVERY DAY       Objective:   BP (!) 160/100   Pulse 80   Resp 18   Ht 1.803 m (5\' 11" )   Wt 107 kg (235 lb)   SpO2 98%   BMI 32.78 kg/m       General appearance: alert, oriented x 3, in his normal state, cooperative, not in apparent distress, appearing  stated age   HEENT:  Eyes:  Pupils equal round react like accommodation extraocular muscles intact. Ears within normal limits throat clear, tonsils normal, no cervical lymphadenopathy thyroid normal  Lungs: clear to auscultation bilaterally, respirations non-labored  Heart: regular rate and rhythm, S1, S2 normal, no murmur, PMI non-diffuse  Abdomen: soft, non-tender. Bowel sounds normal.  Extremities: extremities normal, atraumatic, no cyanosis or edema, pulses intact in upper and lower extremities    Assessment     ENCOUNTER DIAGNOSES     ICD-10-CM   1. Alcohol use disorder, severe, in early remission (CMS HCC)  F10.21   2. Essential hypertension  I10   3. Type 2 diabetes mellitus without complication, without long-term current use of insulin (CMS HCC)  E11.9   4. Attention deficit hyperactivity disorder (ADHD), combined type  F90.2       Plan     Orders Placed This Encounter   . lisdexamfetamine (VYVANSE) 20 mg Oral Capsule           Patient is to continue on all current medications other than those changed in the orders.     As far as his possibility of ADHD it is difficult to say obviously patient needs to refrain from alcohol use his blood pressure is elevated today will go ahead and try him on Vyvanse 20 mg.  I would like for the patient monitor his blood pressure heart rate and write those numbers down let me know if he is having any issues.  Will have patient recheck here 1 month certainly sooner for problems.    Nelda Severe Mel Almond, M.D.

## 2021-10-27 NOTE — Nursing Note (Addendum)
Pt supplied medication, Vivitrol 380 mg/vial. Injection given in Rt hip. Pt tolerated well.

## 2021-10-27 NOTE — Nursing Note (Signed)
10/27/21 1000   Medication Administration   Initials BY   Witness Initials CB   Medication  Vivitrol   Medication Concentration 3.4 ml DILUENT   Medication Dose Given 380 MG   Medication Volume Given (mL) 383.4 ML   Route of Administration IM   Site Right Hip   NDC # [id number]   LOT # [id number]   Expiration date 09/27/23   Manufacturer [facility] Supplied No   Patient Supplied Yes   Comments: REPEAT IN 30 DAYS

## 2021-11-02 ENCOUNTER — Other Ambulatory Visit (INDEPENDENT_AMBULATORY_CARE_PROVIDER_SITE_OTHER): Payer: Self-pay | Admitting: Family Medicine

## 2021-11-04 ENCOUNTER — Other Ambulatory Visit (INDEPENDENT_AMBULATORY_CARE_PROVIDER_SITE_OTHER): Payer: Self-pay | Admitting: Family Medicine

## 2021-11-26 ENCOUNTER — Encounter (INDEPENDENT_AMBULATORY_CARE_PROVIDER_SITE_OTHER): Payer: Self-pay | Admitting: Family Medicine

## 2021-11-28 ENCOUNTER — Encounter (INDEPENDENT_AMBULATORY_CARE_PROVIDER_SITE_OTHER): Payer: Self-pay | Admitting: Family Medicine

## 2021-11-28 ENCOUNTER — Other Ambulatory Visit (INDEPENDENT_AMBULATORY_CARE_PROVIDER_SITE_OTHER): Payer: Self-pay | Admitting: Family Medicine

## 2021-11-28 MED ORDER — LISDEXAMFETAMINE 20 MG CAPSULE
20.0000 mg | ORAL_CAPSULE | Freq: Every day | ORAL | 0 refills | Status: DC
Start: 2021-11-28 — End: 2022-01-27

## 2021-11-28 NOTE — Telephone Encounter (Signed)
BOP in chart. Lady Gary, MA  11/28/2021, 07:50

## 2021-12-01 ENCOUNTER — Encounter (INDEPENDENT_AMBULATORY_CARE_PROVIDER_SITE_OTHER): Payer: BC Managed Care – PPO | Admitting: Family Medicine

## 2021-12-04 ENCOUNTER — Encounter (INDEPENDENT_AMBULATORY_CARE_PROVIDER_SITE_OTHER): Payer: Self-pay | Admitting: Family Medicine

## 2022-01-02 ENCOUNTER — Other Ambulatory Visit (INDEPENDENT_AMBULATORY_CARE_PROVIDER_SITE_OTHER): Payer: Self-pay | Admitting: Family Medicine

## 2022-01-07 ENCOUNTER — Other Ambulatory Visit (INDEPENDENT_AMBULATORY_CARE_PROVIDER_SITE_OTHER): Payer: Self-pay | Admitting: Family Medicine

## 2022-01-07 DIAGNOSIS — F1021 Alcohol dependence, in remission: Secondary | ICD-10-CM

## 2022-01-07 DIAGNOSIS — F32A Depression, unspecified: Secondary | ICD-10-CM

## 2022-01-13 ENCOUNTER — Telehealth (INDEPENDENT_AMBULATORY_CARE_PROVIDER_SITE_OTHER): Payer: Self-pay | Admitting: Family Medicine

## 2022-01-13 NOTE — Telephone Encounter (Signed)
Phone call from [place] case Freight forwarder from [facility]  [phone],  wanting to schedule a Rehab/discharge appt.  I spoke with patients wife [name], she states discharge planning is set for  Jan 23, 2022. I ask her to call me when he is discharged and I will schedule the f/u appt or if plan should change call me with updates.

## 2022-01-27 ENCOUNTER — Ambulatory Visit (INDEPENDENT_AMBULATORY_CARE_PROVIDER_SITE_OTHER): Payer: BC Managed Care – PPO | Admitting: Family Medicine

## 2022-01-27 ENCOUNTER — Other Ambulatory Visit: Payer: Self-pay

## 2022-01-27 ENCOUNTER — Encounter (INDEPENDENT_AMBULATORY_CARE_PROVIDER_SITE_OTHER): Payer: Self-pay | Admitting: Family Medicine

## 2022-01-27 VITALS — BP 122/70 | HR 101 | Resp 18 | Ht 70.98 in | Wt 234.0 lb

## 2022-01-27 DIAGNOSIS — E782 Mixed hyperlipidemia: Secondary | ICD-10-CM

## 2022-01-27 DIAGNOSIS — R748 Abnormal levels of other serum enzymes: Secondary | ICD-10-CM

## 2022-01-27 DIAGNOSIS — E119 Type 2 diabetes mellitus without complications: Secondary | ICD-10-CM | POA: Insufficient documentation

## 2022-01-27 DIAGNOSIS — I10 Essential (primary) hypertension: Secondary | ICD-10-CM

## 2022-01-27 DIAGNOSIS — F1021 Alcohol dependence, in remission: Secondary | ICD-10-CM | POA: Insufficient documentation

## 2022-01-27 MED ORDER — AMLODIPINE 10 MG TABLET
10.0000 mg | ORAL_TABLET | Freq: Every day | ORAL | 3 refills | Status: DC
Start: 2022-01-27 — End: 2023-01-30

## 2022-01-27 MED ORDER — ACAMPROSATE 333 MG TABLET,DELAYED RELEASE
DELAYED_RELEASE_TABLET | ORAL | 5 refills | Status: DC
Start: 2022-01-27 — End: 2022-08-18

## 2022-01-27 MED ORDER — SERTRALINE 50 MG TABLET
50.0000 mg | ORAL_TABLET | Freq: Every day | ORAL | 3 refills | Status: DC
Start: 2022-01-27 — End: 2023-02-08

## 2022-01-27 MED ORDER — MIRTAZAPINE 15 MG TABLET
15.0000 mg | ORAL_TABLET | Freq: Every evening | ORAL | 3 refills | Status: DC
Start: 2022-01-27 — End: 2023-02-03

## 2022-01-27 NOTE — Nursing Note (Signed)
01/27/22 1100   Medication Administration   Initials cb   Medication  Vivitrol   Medication Dose Given 380   Medication Volume Given (mL) mg   Route of Administration IM   Site Right Hip   NDC # [id number]   LOT # [id number]   Expiration date 12/21/23   [facility] Supplied No   Patient Supplied Yes

## 2022-01-27 NOTE — Progress Notes (Signed)
[facility]  [address] [place] [address]  Dept Phone: [phone]  Dept Fax: [phone]    Danny [name]  [birth year]  [id number]    Date of Service: 01/27/2022   Chief complaint:   Chief Complaint   Patient presents with   . Follow Up     anxiety , hypertension, alcohol abuse    . Blood Work     Pt isnt fasting - check liver enzymes , cholesterol level    . Medication Advice     Pt wants  to discuss his cholesterol medication        Subjective:   Patient presents today really for an acute visit to discuss his history of alcohol abuse.  Patient was recently discharged from an inpatient rehab facility yesterday and is currently living in a residence facility for people with substance abuse disorders.  Patient states that he is doing well he feels very comfortable cough and at this time that he is going to remain in refrain off alcohol.  He did have couple issues he wanted to discuss such as elevated liver enzymes and his cholesterol issues.  No other issues or concerns.    Current Outpatient Medications   Medication Sig   . acamprosate (CAMPRAL) 333 mg Oral Tablet, Delayed Release (E.C.) Pt takes 2 tablets  three times a day   . amLODIPine (NORVASC) 10 mg Oral Tablet Take 1 Tablet (10 mg total) by mouth Once a day   . aspirin 81 mg Oral Tablet, Chewable Take 1 Tab (81 mg total) by mouth Once a day   . atorvastatin (LIPITOR) 80 mg Oral Tablet Take 1 Tablet (80 mg total) by mouth Every night (Patient not taking: Reported on 01/27/2022)   . Blood Sugar Diagnostic (ONETOUCH VERIO TEST STRIPS) Strip 1 Strip Twice daily   . Blood-Glucose Meter (ONETOUCH VERIO FLEX METER) Misc USE TO TEST GLUCOSE TWICE A DAY DX E11.9   . cholecalciferol, vitamin D3, 1,000 unit Oral Tablet Take 1 Tablet (1,000 Units total) by mouth Once a day   . colesevelam (WELCHOL) 625 mg Oral Tablet TAKE 3 TABLETS BY MOUTH TWICE A DAY WITH FOOD (Patient not taking: Reported on 01/27/2022)   . DAILY-VITE Oral Tablet TAKE 1  TABLET BY MOUTH EVERY DAY   . diazePAM (VALIUM) 5 mg Oral Tablet Take 1 Tablet (5 mg total) by mouth Every 12 hours as needed for Anxiety   . diphenhydrAMINE (BENADRYL) 25 mg Oral Capsule Take 1 Capsule (25 mg total) by mouth Every 6 hours as needed   . EPINEPHrine (EPIPEN) 0.3 mg/0.3 mL Injection Auto-Injector 0.3 mL (0.3 mg total) by Intramuscular route Once, as needed for up to 1 dose   . FLUoxetine (PROZAC) 40 mg Oral Capsule Take 1 Capsule (40 mg total) by mouth Once a day   . fluticasone (FLONASE) 50 mcg/actuation Nasal Spray, Suspension 1 Spray by Each Nostril route Twice daily   . folic acid (FOLVITE) 1 mg Oral Tablet TAKE 1 TABLET BY MOUTH EVERY DAY   . glipiZIDE (GLUCOTROL) 5 mg Oral Tablet Take 1 Tablet (5 mg total) by mouth Every morning before breakfast Take 30 minutes before meals   . lancets (LANCETS,THIN) 23 gauge Misc TEST GLUCOSE TWICE A DAY DX-E11.9   . lansoprazole (PREVACID) 30 mg Oral Capsule, Delayed Release(E.C.) TAKE 1 CAPSULE BY MOUTH EVERY DAY INDICATIONS: GASTROESOPHAGEAL REFLUX DISEASE   . loratadine (CLARITIN) 10 mg Oral Tablet Take 1 Tablet (10 mg total) by  mouth Once a day   . losartan (COZAAR) 50 mg Oral Tablet TAKE 1 TABLET (50 MG TOTAL) BY MOUTH ONCE A DAY   . metoprolol succinate (TOPROL-XL) 25 mg Oral Tablet Sustained Release 24 hr TAKE 1 TABLET BY MOUTH EVERY DAY   . mirtazapine (REMERON) 15 mg Oral Tablet Take 1 Tablet (15 mg total) by mouth Every night   . naltrexone microspheres (VIVITROL) 380 mg IntraMUSCULAR Suspension,Sust.Release Recon Inject 4 mL (380 mg total) into the muscle Every 28 days DX - F10.9 Alcohol Use   . naphazoline-pheniramine (OPCON-A) 0.02675-0.315 % Ophthalmic Drops Instill 1 Drop into both eyes Once per day as needed   . ondansetron (ZOFRAN) 4 mg Oral Tablet Take 1 Tab (4 mg total) by mouth Every 8 hours as needed for nausea/vomiting   . PROAIR HFA 90 mcg/actuation Inhalation HFA Aerosol Inhaler Take 1-2 Puffs by inhalation Every 6 hours as needed   .  sertraline (ZOLOFT) 50 mg Oral Tablet Take 1 Tablet (50 mg total) by mouth Once a day   . thiamine (VITAMIN B1) 100 mg Oral Tablet TAKE 1 TABLET BY MOUTH EVERY DAY       Objective:   BP 122/70   Pulse (!) 101   Resp 18   Ht 1.803 m (5' 10.98")   Wt 106 kg (234 lb)   SpO2 98%   BMI 32.65 kg/m       General appearance: alert, oriented x 3, in his normal state, cooperative, not in apparent distress, appearing stated age   HEENT:  Eyes:  Pupils equal round react like accommodation extraocular muscles intact. Ears within normal limits throat clear, tonsils normal, no cervical lymphadenopathy thyroid normal  Lungs: clear to auscultation bilaterally, respirations non-labored  Heart: regular rate and rhythm, S1, S2 normal, no murmur, PMI non-diffuse  Abdomen: soft, non-tender. Bowel sounds normal.  Extremities: extremities normal, atraumatic, no cyanosis or edema, pulses intact in upper and lower extremities    Assessment     ENCOUNTER DIAGNOSES     ICD-10-CM   1. Alcohol use disorder, severe, in early remission (CMS HCC)  F10.21   2. Type 2 diabetes mellitus without complication, without long-term current use of insulin (CMS HCC)  E11.9   3. Essential hypertension  I10   4. Mixed hyperlipidemia  E78.2   5. Elevated liver enzymes  R74.8       Plan     Orders Placed This Encounter   . COMPREHENSIVE METABOLIC PNL, FASTING   . LIPID PANEL   . HGA1C (HEMOGLOBIN A1C WITH EST AVG GLUCOSE)   . acamprosate (CAMPRAL) 333 mg Oral Tablet, Delayed Release (E.C.)   . amLODIPine (NORVASC) 10 mg Oral Tablet   . mirtazapine (REMERON) 15 mg Oral Tablet   . sertraline (ZOLOFT) 50 mg Oral Tablet           Patient is to continue on all current medications other than those changed in the orders.     Overall patient seems to be doing well.  Obviously encouraged him to her refrain from alcohol use.  We are going to order some lab work which she will get done fasting within the next few days.  May want to address his cholesterol.  Will have  patient recheck here in 2 months certainly sooner for problems.    Nelda Severe Mel Almond, M.D.

## 2022-01-27 NOTE — Nursing Note (Signed)
Pt received Vivitrol injection 380 mg IM per order in right  hip . See medication administration for details. Lincoln Brigham, LPN  08/24/5858, 29:24

## 2022-01-28 ENCOUNTER — Other Ambulatory Visit: Payer: BC Managed Care – PPO | Attending: Family Medicine | Admitting: Family Medicine

## 2022-01-28 ENCOUNTER — Ambulatory Visit (INDEPENDENT_AMBULATORY_CARE_PROVIDER_SITE_OTHER): Payer: BC Managed Care – PPO

## 2022-01-28 DIAGNOSIS — E119 Type 2 diabetes mellitus without complications: Secondary | ICD-10-CM

## 2022-01-28 DIAGNOSIS — R748 Abnormal levels of other serum enzymes: Secondary | ICD-10-CM

## 2022-01-28 DIAGNOSIS — E782 Mixed hyperlipidemia: Secondary | ICD-10-CM

## 2022-01-28 LAB — LIPID PANEL
CHOL/HDL RATIO: 5.9
CHOLESTEROL: 240 mg/dL — ABNORMAL HIGH (ref 100–200)
HDL CHOL: 41 mg/dL — ABNORMAL LOW (ref >=50)
LDL CALC: 176 mg/dL — ABNORMAL HIGH (ref <100)
NON-HDL: 199 mg/dL — ABNORMAL HIGH (ref <=190)
TRIGLYCERIDES: 116 mg/dL (ref <150)
VLDL CALC: 23 mg/dL (ref <30)

## 2022-01-28 LAB — HGA1C (HEMOGLOBIN A1C WITH EST AVG GLUCOSE)
ESTIMATED AVERAGE GLUCOSE: 148 mg/dL
HEMOGLOBIN A1C: 6.8 % — ABNORMAL HIGH (ref 4.1–5.7)

## 2022-01-28 LAB — COMPREHENSIVE METABOLIC PNL, FASTING
ALBUMIN: 3.7 g/dL (ref 3.4–4.8)
ALKALINE PHOSPHATASE: 93 U/L (ref 45–115)
ALT (SGPT): 12 U/L (ref 10–55)
ANION GAP: 12 mmol/L (ref 4–13)
AST (SGOT): 20 U/L (ref 8–45)
BILIRUBIN TOTAL: 0.7 mg/dL (ref 0.3–1.3)
BUN/CREA RATIO: 11 (ref 6–22)
BUN: 11 mg/dL (ref 8–25)
CALCIUM: 10 mg/dL (ref 8.8–10.2)
CHLORIDE: 106 mmol/L (ref 96–111)
CO2 TOTAL: 24 mmol/L (ref 23–31)
CREATININE: 1.03 mg/dL (ref 0.75–1.35)
ESTIMATED GFR: 83 mL/min/BSA (ref >=60)
GLUCOSE: 135 mg/dL — ABNORMAL HIGH (ref 70–99)
POTASSIUM: 3.8 mmol/L (ref 3.5–5.1)
PROTEIN TOTAL: 7 g/dL (ref 6.0–8.0)
SODIUM: 142 mmol/L (ref 136–145)

## 2022-01-30 ENCOUNTER — Telehealth (INDEPENDENT_AMBULATORY_CARE_PROVIDER_SITE_OTHER): Payer: Self-pay | Admitting: Family Medicine

## 2022-01-30 NOTE — Telephone Encounter (Signed)
See his lab results. I spoke to his wife and i thought I sent it back to you, to make you aware that he has not been taking his Lipitor and he was on Welchol as well. He is aware of Glizipide 10mg  and they have plenty of those right now, you need to decide what you want him to take for his cholesterol and send in. Lady Gary, MA  01/30/2022, 10:33

## 2022-02-17 ENCOUNTER — Other Ambulatory Visit (INDEPENDENT_AMBULATORY_CARE_PROVIDER_SITE_OTHER): Payer: Self-pay | Admitting: Family Medicine

## 2022-02-17 MED ORDER — NALTREXONE ER 380 MG INTRAMUSCULAR SUSPENSION,EXTENDED RELEASE
380.0000 mg | INTRAMUSCULAR | 5 refills | Status: DC
Start: 2022-02-17 — End: 2022-03-12

## 2022-02-17 NOTE — Telephone Encounter (Signed)
The doctor in the hospital cancelled the Rx for Vivitrol. They are not sure why or how he did it. But pharmacy needs new prescription from [place]. Lady Gary, MA  02/17/2022, 14:59

## 2022-02-20 ENCOUNTER — Encounter (INDEPENDENT_AMBULATORY_CARE_PROVIDER_SITE_OTHER): Payer: BC Managed Care – PPO | Admitting: Family Medicine

## 2022-02-22 ENCOUNTER — Other Ambulatory Visit (INDEPENDENT_AMBULATORY_CARE_PROVIDER_SITE_OTHER): Payer: Self-pay | Admitting: Family Medicine

## 2022-02-23 ENCOUNTER — Ambulatory Visit (HOSPITAL_COMMUNITY): Payer: Self-pay

## 2022-02-23 NOTE — Telephone Encounter (Addendum)
Pt added to psych waiting list.    [place]. Colletta Maryland, CASE MANAGER  02/23/2022, 11:05        ----- Message from Grandville Silos sent at 02/23/2022  8:52 AM EST -----  Pt is wanting to schedule/wait list [facility]. Pt is aware of the process. Thank you.    [employer]

## 2022-03-12 ENCOUNTER — Other Ambulatory Visit (INDEPENDENT_AMBULATORY_CARE_PROVIDER_SITE_OTHER): Payer: Self-pay | Admitting: Family Medicine

## 2022-03-12 ENCOUNTER — Other Ambulatory Visit: Payer: Self-pay

## 2022-03-12 ENCOUNTER — Ambulatory Visit (INDEPENDENT_AMBULATORY_CARE_PROVIDER_SITE_OTHER): Payer: BC Managed Care – PPO

## 2022-03-12 DIAGNOSIS — F1010 Alcohol abuse, uncomplicated: Secondary | ICD-10-CM

## 2022-03-12 MED ORDER — NALTREXONE ER 380 MG INTRAMUSCULAR SUSPENSION,EXTENDED RELEASE
380.0000 mg | INTRAMUSCULAR | 0 refills | Status: DC
Start: 2022-03-12 — End: 2022-05-13

## 2022-03-13 NOTE — Nursing Note (Signed)
03/12/22 1300   Medication Administration   Initials BY   Medication  Vivitrol   Medication Dose Given 380 mg   Medication Volume Given (mL) 46m   Route of Administration IM   Site Left Hip   NDC # [id number]  LOT # [id number]  Expiration date 06/19/24   Manufacturer [email or web address]   Patient Supplied Yes   Comments: Repeat in 28 days

## 2022-03-20 ENCOUNTER — Other Ambulatory Visit (INDEPENDENT_AMBULATORY_CARE_PROVIDER_SITE_OTHER): Payer: Self-pay | Admitting: Family Medicine

## 2022-03-27 ENCOUNTER — Encounter (INDEPENDENT_AMBULATORY_CARE_PROVIDER_SITE_OTHER): Payer: BC Managed Care – PPO | Admitting: Family Medicine

## 2022-03-27 ENCOUNTER — Other Ambulatory Visit (INDEPENDENT_AMBULATORY_CARE_PROVIDER_SITE_OTHER): Payer: Self-pay | Admitting: Family Medicine

## 2022-03-31 ENCOUNTER — Other Ambulatory Visit (INDEPENDENT_AMBULATORY_CARE_PROVIDER_SITE_OTHER): Payer: Self-pay | Admitting: Family Medicine

## 2022-04-01 ENCOUNTER — Other Ambulatory Visit (INDEPENDENT_AMBULATORY_CARE_PROVIDER_SITE_OTHER): Payer: Self-pay | Admitting: Family Medicine

## 2022-04-01 MED ORDER — METOPROLOL SUCCINATE ER 25 MG TABLET,EXTENDED RELEASE 24 HR
25.0000 mg | ORAL_TABLET | Freq: Every day | ORAL | 3 refills | Status: DC
Start: 2022-04-01 — End: 2022-12-31

## 2022-04-07 ENCOUNTER — Encounter (INDEPENDENT_AMBULATORY_CARE_PROVIDER_SITE_OTHER): Payer: Self-pay | Admitting: Family Medicine

## 2022-05-11 ENCOUNTER — Encounter (INDEPENDENT_AMBULATORY_CARE_PROVIDER_SITE_OTHER): Payer: BC Managed Care – PPO | Admitting: Family Medicine

## 2022-05-13 ENCOUNTER — Other Ambulatory Visit: Payer: Self-pay

## 2022-05-13 ENCOUNTER — Other Ambulatory Visit: Payer: BC Managed Care – PPO | Attending: Family Medicine | Admitting: Family Medicine

## 2022-05-13 ENCOUNTER — Ambulatory Visit (INDEPENDENT_AMBULATORY_CARE_PROVIDER_SITE_OTHER): Payer: BC Managed Care – PPO | Admitting: Family Medicine

## 2022-05-13 ENCOUNTER — Encounter (INDEPENDENT_AMBULATORY_CARE_PROVIDER_SITE_OTHER): Payer: Self-pay | Admitting: Family Medicine

## 2022-05-13 ENCOUNTER — Ambulatory Visit (INDEPENDENT_AMBULATORY_CARE_PROVIDER_SITE_OTHER): Payer: BC Managed Care – PPO

## 2022-05-13 VITALS — BP 126/86 | HR 76 | Resp 18 | Ht 71.0 in | Wt 239.0 lb

## 2022-05-13 DIAGNOSIS — E119 Type 2 diabetes mellitus without complications: Secondary | ICD-10-CM

## 2022-05-13 DIAGNOSIS — E782 Mixed hyperlipidemia: Secondary | ICD-10-CM

## 2022-05-13 DIAGNOSIS — I10 Essential (primary) hypertension: Secondary | ICD-10-CM | POA: Insufficient documentation

## 2022-05-13 DIAGNOSIS — F1021 Alcohol dependence, in remission: Secondary | ICD-10-CM

## 2022-05-13 DIAGNOSIS — F32A Depression, unspecified: Secondary | ICD-10-CM

## 2022-05-13 LAB — CBC
HCT: 48.2 % (ref 38.9–52.0)
HGB: 15.5 g/dL (ref 13.4–17.5)
MCH: 29.9 pg (ref 26.0–32.0)
MCHC: 32.2 g/dL (ref 31.0–35.5)
MCV: 92.9 fL (ref 78.0–100.0)
MPV: 10.2 fL (ref 8.7–12.5)
PLATELETS: 279 10*3/uL (ref 150–400)
RBC: 5.19 10*6/uL (ref 4.50–6.10)
RDW-CV: 13.2 % (ref 11.5–15.5)
WBC: 6.5 10*3/uL (ref 3.7–11.0)

## 2022-05-13 LAB — HGA1C (HEMOGLOBIN A1C WITH EST AVG GLUCOSE)
ESTIMATED AVERAGE GLUCOSE: 120 mg/dL
HEMOGLOBIN A1C: 5.8 % — ABNORMAL HIGH (ref 4.1–5.7)

## 2022-05-13 LAB — COMPREHENSIVE METABOLIC PNL, FASTING
ALBUMIN: 4.1 g/dL (ref 3.4–4.8)
ALKALINE PHOSPHATASE: 87 U/L (ref 45–115)
ALT (SGPT): 20 U/L (ref 10–55)
ANION GAP: 13 mmol/L (ref 4–13)
AST (SGOT): 22 U/L (ref 8–45)
BILIRUBIN TOTAL: 0.6 mg/dL (ref 0.3–1.3)
BUN/CREA RATIO: 9 (ref 6–22)
BUN: 11 mg/dL (ref 8–25)
CALCIUM: 10 mg/dL (ref 8.8–10.2)
CHLORIDE: 102 mmol/L (ref 96–111)
CO2 TOTAL: 23 mmol/L (ref 23–31)
CREATININE: 1.16 mg/dL (ref 0.75–1.35)
ESTIMATED GFR: 72 mL/min/BSA (ref >=60)
GLUCOSE: 141 mg/dL — ABNORMAL HIGH (ref 70–99)
POTASSIUM: 3.9 mmol/L (ref 3.5–5.1)
PROTEIN TOTAL: 7.5 g/dL (ref 6.0–8.0)
SODIUM: 138 mmol/L (ref 136–145)

## 2022-05-13 LAB — LIPID PANEL
CHOL/HDL RATIO: 5.9
CHOLESTEROL: 249 mg/dL — ABNORMAL HIGH (ref 100–200)
HDL CHOL: 42 mg/dL — ABNORMAL LOW (ref >=50)
LDL CALC: 172 mg/dL — ABNORMAL HIGH (ref <100)
NON-HDL: 207 mg/dL — ABNORMAL HIGH (ref <=190)
TRIGLYCERIDES: 186 mg/dL — ABNORMAL HIGH (ref <150)
VLDL CALC: 37 mg/dL — ABNORMAL HIGH (ref <30)

## 2022-05-13 LAB — MICROALBUMIN/CREATININE RATIO, URINE, RANDOM
CREATININE RANDOM URINE: 121 mg/dL — ABNORMAL HIGH (ref 50–100)
MICROALBUMIN RANDOM URINE: 6.5 mg/dL
MICROALBUMIN/CREATININE RATIO RANDOM URINE: 53.7 mg/g — ABNORMAL HIGH (ref <30.0)

## 2022-05-13 MED ORDER — NALTREXONE ER 380 MG INTRAMUSCULAR SUSPENSION,EXTENDED RELEASE
380.0000 mg | INTRAMUSCULAR | 0 refills | Status: DC
Start: 2022-05-13 — End: 2022-06-01

## 2022-05-13 NOTE — Nursing Note (Signed)
05/13/22 0800   Medication Administration   Initials BY   Witness Initials CB   Medication  Vivitrol   Medication Dose Given '380mg'$    Medication Volume Given (mL) 4 ml   Route of Administration IM   Site Right Hip   NDC # [id number]   LOT # [id number]   Expiration date 07/13/24   Manufacturer [facility] Supplied No   Patient Supplied Yes   Comments: REPEAT IN 28 DAYS

## 2022-05-13 NOTE — Nursing Note (Signed)
05/13/22 0758   Fall Risk Assessment   Do you feel unsteady when standing or walking? No   Do you worry about falling? No   Have you fallen in the past year? No

## 2022-05-13 NOTE — Nursing Note (Signed)
05/13/22 0750   Monkeypox Screening   In the past 21 days, have you been in contact with someone who has confirmed or suspected to have Monkeypox; or do you suspect you may have Monkeypox? No

## 2022-05-13 NOTE — Nursing Note (Signed)
05/13/22 0758   Depression Screen   Little interest or pleasure in doing things. 1   Feeling down, depressed, or hopeless 1   PHQ 2 Total 2

## 2022-05-13 NOTE — Progress Notes (Signed)
[facility]  [address] [place] [address]  Dept Phone: [phone]  Dept Fax: [phone]    Danny [name]  1961-11-30  [id number]    Date of Service: 05/13/2022   Chief complaint:   Chief Complaint   Patient presents with   . Follow Up 6 Months     HTN, Hyperlipidemia, DM II, Anxiety,Depression,    . Blood Work     Pt is fasting        Subjective:   Patient presents today for routine follow-up of his chronic medical problems which is primarily his hypertension, hyperlipidemia, diabetes, and history of alcohol abuse.  Overall patient is doing well he is not had a drink of alcohol in over 4 months now.  Patient states that he is completely refrain from alcohol.  Far as his blood sugars they have been about the same.  He is trying to watch his diet but his weight has gone up slightly over the last 3 months.  Far as his blood pressure he states it is good he did not take his blood pressure medicine this morning it is up slightly.  He denies headache dizziness chest pain or shortness of breath.    Current Outpatient Medications   Medication Sig   . acamprosate (CAMPRAL) 333 mg Oral Tablet, Delayed Release (E.C.) Pt takes 2 tablets  three times a day   . amLODIPine (NORVASC) 10 mg Oral Tablet Take 1 Tablet (10 mg total) by mouth Once a day   . aspirin 81 mg Oral Tablet, Chewable Take 1 Tab (81 mg total) by mouth Once a day   . Blood Sugar Diagnostic (ONETOUCH VERIO TEST STRIPS) Strip 1 Strip Twice daily   . Blood-Glucose Meter (ONETOUCH VERIO FLEX METER) Misc USE TO TEST GLUCOSE TWICE A DAY DX E11.9   . cholecalciferol, vitamin D3, 1,000 unit Oral Tablet Take 1 Tablet (1,000 Units total) by mouth Once a day   . colesevelam (WELCHOL) 625 mg Oral Tablet TAKE 3 TABLETS BY MOUTH TWICE A DAY WITH FOOD   . DAILY-VITE Oral Tablet TAKE 1 TABLET BY MOUTH EVERY DAY   . diazePAM (VALIUM) 5 mg Oral Tablet TAKE 1 TABLET BY MOUTH EVERY 12 HOURS AS NEEDED FOR ANXIETY   . diphenhydrAMINE (BENADRYL)  25 mg Oral Capsule Take 1 Capsule (25 mg total) by mouth Every 6 hours as needed   . EPINEPHrine (EPIPEN) 0.3 mg/0.3 mL Injection Auto-Injector 0.3 mL (0.3 mg total) by Intramuscular route Once, as needed for up to 1 dose   . fluticasone (FLONASE) 50 mcg/actuation Nasal Spray, Suspension 1 Spray by Each Nostril route Twice daily   . folic acid (FOLVITE) 1 mg Oral Tablet TAKE 1 TABLET BY MOUTH EVERY DAY   . glipiZIDE (GLUCOTROL) 5 mg Oral Tablet Take 1 Tablet (5 mg total) by mouth Every morning before breakfast Take 30 minutes before meals   . lancets (LANCETS,THIN) 23 gauge Misc TEST GLUCOSE TWICE A DAY DX-E11.9   . lansoprazole (PREVACID) 30 mg Oral Capsule, Delayed Release(E.C.) TAKE 1 CAPSULE BY MOUTH EVERY DAY INDICATIONS: GASTROESOPHAGEAL REFLUX DISEASE   . loratadine (CLARITIN) 10 mg Oral Tablet Take 1 Tablet (10 mg total) by mouth Once a day   . losartan (COZAAR) 50 mg Oral Tablet TAKE 1 TABLET BY MOUTH EVERY DAY   . metoprolol succinate (TOPROL-XL) 25 mg Oral Tablet Sustained Release 24 hr Take 1 Tablet (25 mg total) by mouth Once a day   . mirtazapine (  REMERON) 15 mg Oral Tablet Take 1 Tablet (15 mg total) by mouth Every night   . naltrexone microspheres (VIVITROL) 380 mg IntraMUSCULAR Suspension,Sust.Release Recon Inject 4 mL (380 mg total) into the muscle Now for 1 dose DX - F10.9 Alcohol Use Indications: alcohol use disorder   . naphazoline-pheniramine (OPCON-A) 0.02675-0.315 % Ophthalmic Drops Instill 1 Drop into both eyes Once per day as needed   . ondansetron (ZOFRAN) 4 mg Oral Tablet Take 1 Tab (4 mg total) by mouth Every 8 hours as needed for nausea/vomiting   . PROAIR HFA 90 mcg/actuation Inhalation HFA Aerosol Inhaler Take 1-2 Puffs by inhalation Every 6 hours as needed   . sertraline (ZOLOFT) 50 mg Oral Tablet Take 1 Tablet (50 mg total) by mouth Once a day   . thiamine (VITAMIN B1) 100 mg Oral Tablet TAKE 1 TABLET BY MOUTH EVERY DAY       Objective:   BP 126/86   Pulse 76   Resp 18   Ht 1.803  m ('5\' 11"'$ )   Wt 108 kg (239 lb)   SpO2 97%   BMI 33.33 kg/m       General appearance: alert, oriented x 3, in his normal state, cooperative, not in apparent distress, appearing stated age   HEENT:  Eyes:  Pupils equal round react like accommodation extraocular muscles intact. Ears within normal limits throat clear, tonsils normal, no cervical lymphadenopathy thyroid normal  Lungs: clear to auscultation bilaterally, respirations non-labored  Heart: regular rate and rhythm, S1, S2 normal, no murmur, PMI non-diffuse  Abdomen: soft, non-tender. Bowel sounds normal.  Extremities: extremities normal, atraumatic, no cyanosis or edema, pulses intact in upper and lower extremities    Assessment     ENCOUNTER DIAGNOSES     ICD-10-CM   1. Essential hypertension  I10   2. Mixed hyperlipidemia  E78.2   3. Alcohol use disorder, severe, in early remission (CMS HCC)  F10.21   4. Type 2 diabetes mellitus without complication, without long-term current use of insulin (CMS HCC)  E11.9   5. Depression, unspecified depression type  F32.A       Plan     Orders Placed This Encounter   . Microalbumin/Creatinine Ratio, Urine, Random   . CBC   . COMPREHENSIVE METABOLIC PNL, FASTING   . HGA1C (HEMOGLOBIN A1C WITH EST AVG GLUCOSE)   . LIPID PANEL   . naltrexone microspheres (VIVITROL) 380 mg IntraMUSCULAR Suspension,Sust.Release Recon       The PHQ 2 Total: 2 depression screen is interpreted as negative.    Patient is to continue on all current medications other than those changed in the orders.     1. Hypertension: Currently appears to be well managed  2. Hyperlipidemia:  Will check labs and call with results.  Due to his alcohol abuse we have been hesitant to treat his hyperlipidemia but he is doing well so once we get his numbers back we may consider starting him on cholesterol medication.  3. Type 2 diabetes:  Patient's blood sugars have been moderately well managed we are going to check lab work today and will call patient with  results.  May consider starting him on 1 of the injectables that is once a week to help manage his diabetes and help weight control also.    4. Alcohol abuse:  Patient is completely refrain from alcohol and seems to be doing well encouraged him to continue and will have patient recheck here in 3 months.  Nelda Severe Mel Almond, M.D.

## 2022-05-14 ENCOUNTER — Other Ambulatory Visit (INDEPENDENT_AMBULATORY_CARE_PROVIDER_SITE_OTHER): Payer: Self-pay | Admitting: Family Medicine

## 2022-05-14 MED ORDER — ROSUVASTATIN 10 MG TABLET
10.0000 mg | ORAL_TABLET | Freq: Every evening | ORAL | 3 refills | Status: DC
Start: 2022-05-14 — End: 2023-05-10

## 2022-05-14 MED ORDER — MOUNJARO 2.5 MG/0.5 ML SUBCUTANEOUS PEN INJECTOR
2.5000 mg | PEN_INJECTOR | SUBCUTANEOUS | 2 refills | Status: DC
Start: 2022-05-14 — End: 2022-06-16

## 2022-05-18 ENCOUNTER — Encounter (INDEPENDENT_AMBULATORY_CARE_PROVIDER_SITE_OTHER): Payer: Self-pay | Admitting: Family Medicine

## 2022-06-01 ENCOUNTER — Other Ambulatory Visit (INDEPENDENT_AMBULATORY_CARE_PROVIDER_SITE_OTHER): Payer: Self-pay | Admitting: Family Medicine

## 2022-06-01 ENCOUNTER — Encounter (INDEPENDENT_AMBULATORY_CARE_PROVIDER_SITE_OTHER): Payer: Self-pay | Admitting: Family Medicine

## 2022-06-01 MED ORDER — NALTREXONE ER 380 MG INTRAMUSCULAR SUSPENSION,EXTENDED RELEASE
380.0000 mg | INTRAMUSCULAR | 0 refills | Status: DC
Start: 2022-06-01 — End: 2022-06-02

## 2022-06-02 ENCOUNTER — Other Ambulatory Visit (INDEPENDENT_AMBULATORY_CARE_PROVIDER_SITE_OTHER): Payer: Self-pay | Admitting: Family Medicine

## 2022-06-02 MED ORDER — NALTREXONE ER 380 MG INTRAMUSCULAR SUSPENSION,EXTENDED RELEASE
380.0000 mg | INTRAMUSCULAR | 5 refills | Status: DC
Start: 2022-06-02 — End: 2022-06-02

## 2022-06-02 MED ORDER — NALTREXONE ER 380 MG INTRAMUSCULAR SUSPENSION,EXTENDED RELEASE
380.0000 mg | INTRAMUSCULAR | 5 refills | Status: DC
Start: 2022-06-02 — End: 2022-07-02

## 2022-06-15 ENCOUNTER — Encounter (INDEPENDENT_AMBULATORY_CARE_PROVIDER_SITE_OTHER): Payer: Self-pay

## 2022-06-15 ENCOUNTER — Encounter (INDEPENDENT_AMBULATORY_CARE_PROVIDER_SITE_OTHER): Payer: Self-pay | Admitting: Family Medicine

## 2022-06-16 ENCOUNTER — Other Ambulatory Visit (INDEPENDENT_AMBULATORY_CARE_PROVIDER_SITE_OTHER): Payer: Self-pay | Admitting: Family Medicine

## 2022-06-16 MED ORDER — MOUNJARO 5 MG/0.5 ML SUBCUTANEOUS PEN INJECTOR
5.0000 mg | PEN_INJECTOR | SUBCUTANEOUS | 5 refills | Status: DC
Start: 2022-06-16 — End: 2022-12-11

## 2022-06-17 ENCOUNTER — Ambulatory Visit (INDEPENDENT_AMBULATORY_CARE_PROVIDER_SITE_OTHER): Payer: BC Managed Care – PPO

## 2022-06-17 ENCOUNTER — Other Ambulatory Visit: Payer: Self-pay

## 2022-06-17 DIAGNOSIS — F1010 Alcohol abuse, uncomplicated: Secondary | ICD-10-CM

## 2022-06-25 ENCOUNTER — Other Ambulatory Visit (INDEPENDENT_AMBULATORY_CARE_PROVIDER_SITE_OTHER): Payer: Self-pay | Admitting: Family Medicine

## 2022-07-02 ENCOUNTER — Other Ambulatory Visit (INDEPENDENT_AMBULATORY_CARE_PROVIDER_SITE_OTHER): Payer: Self-pay | Admitting: Family Medicine

## 2022-07-02 ENCOUNTER — Encounter (INDEPENDENT_AMBULATORY_CARE_PROVIDER_SITE_OTHER): Payer: Self-pay | Admitting: Family Medicine

## 2022-07-02 DIAGNOSIS — F1010 Alcohol abuse, uncomplicated: Secondary | ICD-10-CM

## 2022-07-02 MED ORDER — NALTREXONE ER 380 MG INTRAMUSCULAR SUSPENSION,EXTENDED RELEASE
380.0000 mg | INTRAMUSCULAR | 5 refills | Status: DC
Start: 2022-07-02 — End: 2022-07-17

## 2022-07-14 ENCOUNTER — Other Ambulatory Visit (INDEPENDENT_AMBULATORY_CARE_PROVIDER_SITE_OTHER): Payer: Self-pay | Admitting: Family Medicine

## 2022-07-15 ENCOUNTER — Encounter (INDEPENDENT_AMBULATORY_CARE_PROVIDER_SITE_OTHER): Payer: Self-pay | Admitting: Family Medicine

## 2022-07-16 ENCOUNTER — Encounter (INDEPENDENT_AMBULATORY_CARE_PROVIDER_SITE_OTHER): Payer: Self-pay

## 2022-07-17 ENCOUNTER — Other Ambulatory Visit: Payer: Self-pay

## 2022-07-17 ENCOUNTER — Ambulatory Visit (INDEPENDENT_AMBULATORY_CARE_PROVIDER_SITE_OTHER): Payer: BC Managed Care – PPO

## 2022-07-17 ENCOUNTER — Other Ambulatory Visit (INDEPENDENT_AMBULATORY_CARE_PROVIDER_SITE_OTHER): Payer: Self-pay | Admitting: Family Medicine

## 2022-07-17 DIAGNOSIS — F1010 Alcohol abuse, uncomplicated: Secondary | ICD-10-CM

## 2022-07-17 MED ORDER — NALTREXONE ER 380 MG INTRAMUSCULAR SUSPENSION,EXTENDED RELEASE
380.0000 mg | Freq: Once | INTRAMUSCULAR | Status: AC
Start: 2022-07-17 — End: 2022-07-17
  Administered 2022-07-17: 380 mg via INTRAMUSCULAR

## 2022-07-17 MED ORDER — NALTREXONE ER 380 MG INTRAMUSCULAR SUSPENSION,EXTENDED RELEASE
380.0000 mg | INTRAMUSCULAR | 5 refills | Status: DC
Start: 2022-07-17 — End: 2022-08-03

## 2022-07-17 NOTE — Progress Notes (Signed)
Gave Vivatrol injection in right hip Sheila Minick, MA

## 2022-07-17 NOTE — Nursing Note (Signed)
Gave Vivatrol in right hip. Lady Gary, MA

## 2022-07-30 NOTE — H&P (Deleted)
[facility] Medicine  [facility] Medicine & Psychiatry  Outpatient Intake History & Physical    Patient name:  Danny [name]   Chart number:  [id number]  Date of birth:  October 18, 1961  Date of service:  08/05/2022      Identification:  Danny [name] is a 61 y.o. male from [place] [address]    Chief Complaint  - Establish care    ID:  Danny [name] is a 61 y.o. male from [place] [address]    History of Present Illness  This patient is a 61 y.o. White male with PPH of Alcohol use disorder, in remission *** who presents today to establish care. Patient was referred to [facility] outpatient clinic by *** for management of ***. Patient used to follow with [facility] outpatient clinic. Was last seen by Dr.Chowdhary on 04/10/2020, states he stopped following ***    Current meds:  -  Zoloft 50 mg daily**. Has been on it for  -  Remeron  15 mg qhs**. Has been on it for  -  Valium 5 mg daily q12h prn for anxiety**. Last script sent on 03/20/22 by Dr.James Mel Almond  - Campral 333 mg TID for AUD (severe, in remission)    Med trials: Prozac (03/2020), Valium (02/2022 was on it for brief course)    DEPRESSIVE SYMPTOMS:  Five+ w/in same 2 week period, nearly every day, ADLs affected  (+)         Sadness, emptiness, hopelessness, tearfulness  (+)         Anhedonia. States  (+)         Decrease in appetite   (-)         Insomnia. Reports on average patient gets around ** hr/night  (+)         Fatigue/ Low energy  (+)         Worthlessness/ excessive, inappropriate guilt  (-)         Poor concentration, poor thinking, indecisiveness,   (+)         Suicidal ideation. Denies prior SA or self harm behaviors. Denies / Admits to access to firearms at home.        MANIC SYMPTOMS: Denies  A. One week+ period, syx. Every or nearly every day; or if hospitalized  (-) elevated, expansive or irritable mood   (-)  increased activity or energy    B. Three or more of:   (-) grandiosity  (-) decreased need for sleep  (-)  pressured speech/more talkative  (-) racing thoughts/flight of ideas  (-) distractibility  (-) increased goal directed activity  (-) Excessive involvement in high risk behavior.       ANXIETY SYMPTOMS: Denies  (+) excessive worry. States "  (+) panic attacks: Most recent episode was        PSYCHOTIC SYMPTOMS: Denies  (-) auditory hallucinations.   (-) visual hallucinations  (-) paranoia    TRAUMA:    Patient denies history of significant trauma such as physical, emotional, or sexual abuse. Denies flashbacks, nightmares, hypervigilance, avoidance.       SUBSTANCE USE HISTORY: ***  Tobacco:   Cannabis:   Alcohol:   Benzodiazepines: Denies   Opiates: Denies   Stimulants: Denies   Other: Denies     Life Stressors: ***  Therapy:  Support system:      ROS:   All other review of systems negative except what is reported in HPI    Home  Medications***  I have reviewed the following med list for accuracy.    Current Outpatient Medications   Medication Sig Dispense Refill    acamprosate (CAMPRAL) 333 mg Oral Tablet, Delayed Release (E.C.) Pt takes 2 tablets  three times a day 180 Tablet 5    amLODIPine (NORVASC) 10 mg Oral Tablet Take 1 Tablet (10 mg total) by mouth Once a day 90 Tablet 3    aspirin 81 mg Oral Tablet, Chewable Take 1 Tab (81 mg total) by mouth Once a day      Blood Sugar Diagnostic (ONETOUCH VERIO TEST STRIPS) Strip 1 Strip Twice daily 200 Each 3    Blood-Glucose Meter (ONETOUCH VERIO FLEX METER) Misc USE TO TEST GLUCOSE TWICE A DAY DX E11.9 1 Each 0    cholecalciferol, vitamin D3, 1,000 unit Oral Tablet Take 1 Tablet (1,000 Units total) by mouth Once a day      colesevelam (WELCHOL) 625 mg Oral Tablet TAKE 3 TABLETS BY MOUTH TWICE A DAY WITH FOOD 180 Tablet 0    DAILY-VITE Oral Tablet TAKE 1 TABLET BY MOUTH EVERY DAY 30 Tablet 11    diazePAM (VALIUM) 5 mg Oral Tablet TAKE 1 TABLET BY MOUTH EVERY 12 HOURS AS NEEDED FOR ANXIETY 30 Tablet 2    diphenhydrAMINE (BENADRYL) 25 mg Oral Capsule Take 1 Capsule (25 mg  total) by mouth Every 6 hours as needed      EPINEPHrine (EPIPEN) 0.3 mg/0.3 mL Injection Auto-Injector 0.3 mL (0.3 mg total) by Intramuscular route Once, as needed for up to 1 dose 2 Each 0    fluticasone (FLONASE) 50 mcg/actuation Nasal Spray, Suspension 1 Spray by Each Nostril route Twice daily 16 g 5    folic acid (FOLVITE) 1 mg Oral Tablet TAKE 1 TABLET BY MOUTH EVERY DAY 30 Tablet 11    glipiZIDE (GLUCOTROL) 5 mg Oral Tablet Take 1 Tablet (5 mg total) by mouth Every morning before breakfast Take 30 minutes before meals      lancets (LANCETS,THIN) 23 gauge Misc TEST GLUCOSE TWICE A DAY DX-E11.9 200 Each 3    lansoprazole (PREVACID) 30 mg Oral Capsule, Delayed Release(E.C.) TAKE 1 CAPSULE BY MOUTH EVERY DAY INDICATIONS: GASTROESOPHAGEAL REFLUX DISEASE 90 Capsule 3    loratadine (CLARITIN) 10 mg Oral Tablet Take 1 Tablet (10 mg total) by mouth Once a day      losartan (COZAAR) 50 mg Oral Tablet TAKE 1 TABLET BY MOUTH EVERY DAY 90 Tablet 3    metoprolol succinate (TOPROL-XL) 25 mg Oral Tablet Sustained Release 24 hr Take 1 Tablet (25 mg total) by mouth Once a day 90 Tablet 3    mirtazapine (REMERON) 15 mg Oral Tablet Take 1 Tablet (15 mg total) by mouth Every night 90 Tablet 3    naltrexone microspheres (VIVITROL) 380 mg IntraMUSCULAR Suspension,Sust.Release Recon Inject 4 mL (380 mg total) into the muscle Every 28 days 4 mL 5    naphazoline-pheniramine (OPCON-A) 0.02675-0.315 % Ophthalmic Drops Instill 1 Drop into both eyes Once per day as needed      ondansetron (ZOFRAN) 4 mg Oral Tablet Take 1 Tab (4 mg total) by mouth Every 8 hours as needed for nausea/vomiting 20 Tab 0    PROAIR HFA 90 mcg/actuation Inhalation HFA Aerosol Inhaler Take 1-2 Puffs by inhalation Every 6 hours as needed 1 Inhaler 3    rosuvastatin (CRESTOR) 10 mg Oral Tablet Take 1 Tablet (10 mg total) by mouth Every evening 90 Tablet 3    sertraline (ZOLOFT) 50  mg Oral Tablet Take 1 Tablet (50 mg total) by mouth Once a day 90 Tablet 3    thiamine  (VITAMIN B1) 100 mg Oral Tablet TAKE 1 TABLET BY MOUTH EVERY DAY 30 Tablet 11    tirzepatide (MOUNJARO) 5 mg/0.5 mL Subcutaneous Pen Injector Inject 0.5 mL (5 mg total) under the skin Every 7 days 2 mL 5     No current facility-administered medications for this visit.     Past Psychiatric History ***  Prior diagnoses: Alcohol use disorder (in remission), MDD  Prior medication trials:  Outpatient: Denies  Inpatient: Denies  Suicide attempts/self-injury:  Violence: Denies     Allergies  Allergies   Allergen Reactions    Percocet [Oxycodone] Anaphylaxis       Past Medical History ***  History of seizures: Denies   History of traumatic brain injury: Denies   Past Medical History:   Diagnosis Date    Anxiety     Elevated lipids     GERD (gastroesophageal reflux disease)     Hypertension     Panic attack     Sleep apnea     no cpap    Vomiting     Wears glasses            Past Surgical History  Past Surgical History:   Procedure Laterality Date    COLONOSCOPY  2015    [facility]    ESOPHAGOGASTRODUODENOSCOPY  2015    [facility]    GASTROSCOPY  07/28/2017    Dr. 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 Other    Healthy Sister, Son, Son    Hypertension (High Blood Pressure) Father              Physical Exam    There were no vitals filed for this visit.        There were no vitals filed for this visit.       General: Appears in good health  Eyes: Pupils equal, round. EOM grossly intact. No nystagmus. Conjunctiva  clear.  Respiratory: Regular rate. No increased work of breathing. No use of accessory muscles.  Cardiovascular: No swelling/edema of exposed extremities. No skin pallor.  Musculoskeletal: Gait/station as below. Moving all 4 extremities. No observed joint swelling.  Neuro: Alert, oriented to person, place, time, situation. No abnormal movements noted. No tremor.  Skin: Dry. No diaphoresis or flushing. No noticeable erythema, abrasions, or lesions on exposed skin.  Psych: Below.    Mental Status Exam  Appearance:  {APPEARANCE BMED (AMB):[phone]}  Behavior:  {BEHAVIOR:50022346}  Motor/MS:  {MOTOR/MS:50022347}.   Speech:  {SPEECH (PSYCH):50022348}  Mood:  {QAST:41962229}  Affect:  {AFFECT:50022350}  Perception:  {PERCEPTION:50022351}  Thought:  {THOUGHT:50022352}  Thought Content:  {THOUGHT CONTENT:50022353}   Suicidal Ideation:  {SUICIDAL  IDEATION BMED (AMB):[phone]}.    Homicidal Ideation:  Denies  Attention/Concentration: grossly intact  Orientation: grossly oriented  Memory: recent and remote memory intact per interview  Language: no word-finding issues  Insight: good  Judgment: good    Labs  Results in Last 18 Months   Lab Test 09/08/21  1033 01/28/22  0718 05/13/22  0829   TSH 3.016  --   --    AST 30   < > 22   ALT 40   < > 20   HA1C  --    < > 5.8*   CHOLESTEROL  --    < > 249*   HDLCHOL  --    < > 42*   LDLCHOL  --    < > 172*   TRIG  --    < > 186*    < > = values in this interval not displayed.       Psychiatric Diagnosis: Alcohol use disorder (Severe, in remission)  Medical comorbidities: DM2, GERD, HLD, HTN    Med trials: ***    Assessment  This patient is a 61 y.o. White male ***. Presents today to re-establish care with [facility] outpatient clinic. Was last seen by Dr.Chowdhary on 03/2020, notes being lost to follow up due to ***      Plan  ***:  - Continue Zoloft 50 mg daily  - Continue Remeron 15 mg qhs    ***:  - ***  - ***    - Supportive therapy provided and non-pharmacological modalities  provided  - Reviewed daily routine, time management and sleep hygiene  - Reviewed coping skills and self care    Therapy: ***  - Supportive therapy provided.  - List of local therapy services was shared via MyChart  Safety:  - Patient denied acute safety concerns. No SI/HI.  - Advised to report to nearest emergency department or to call 911 if having any suicidal or homicidal ideations.    Labs ordered:    - CBC, BMP, TSH, Vit D and Vit B. None ordered at this time.     Follow-up:   - Return to clinic in 6 weeks via video visit.   - Patient advised to call in the interim with questions/concerns and go to the ER is safety concerns arise.    Board of pharmacy:                                      Multi State Controlled Substance Patient Full Name Report Report Date 07/30/2022   From 07/30/2021 To 07/30/2022 Date of Birth [birth year]     Prescription Count 9   Last Name Grabel First Name Everly Name                                   Patients included in report that appear to match the search criteria.   Last Name First Name Middle Name Gender Address                                 Prescriber Name Prescriber [place] Name [place] Zip Rx Written Date Rx Dispense Date & Date Sold Rx Number Product Name Strength Qty Days # of Refill Sched Payment Type  [name]   (Reported to [facility])   Payton Mccallum [id number] Max, L.L.C. [id number] 02/23/2022 02/23/2022 02/23/2022 [id number] TRAMADOL HCL 50 MG 14 7 0/0 CIV Private Pay   Liberty Handy (Dds) [id number] [place], L.L.C. [id number] 02/18/2022 02/18/2022 02/18/2022 [id number] Hydrocodone Bitartrate And Acetaminophen  7.5;MG- 325;MG  8 2 0/0 CII [address]   Wonda Horner [place] [id number] Olivia, L.L.C. [id number] 10/08/2021 12/27/2021 12/31/2021 [id number] DIAZEPAM 5 MG 30 15 2/2 CIV Insurance   Wonda Horner [place] [id number] [facility],  L.L.C. [id number] 11/28/2021 11/28/2021 11/30/2021 [id number] Vyvanse '20MG'$  30 30 0/0 CII Insurance   Wonda Horner [place] [id number] [place], L.L.C. [id number] 10/08/2021 11/09/2021 11/09/2021 [id number] DIAZEPAM 5 MG 30 15 1/2 CIV Insurance   Wonda Horner [place] [id number] [place] [facility], L.L.C[facility] [id number] 10/27/2021 10/27/2021 10/27/2021 [id number] Vyvanse '20MG'$  30 30 0/0 CII Insurance   Wonda Horner [place] [id number] [facility], L.L.C. [id number] 10/08/2021 10/08/2021 10/09/2021 [id number] DIAZEPAM 5 MG 30 15 0/2 CIV Insurance   Wonda Horner [place] [id number] [place] [facility], L.L[facility] [id number] 07/07/2021 09/20/2021 09/25/2021 [id number] DIAZEPAM 5 MG 30 15 2/2 CIV Insurance   Wonda Horner [place] [id number] [place] [facility], L.L.C[facility] [id number] 07/07/2021 08/19/2021 08/20/2021 [id number] DIAZEPAM 5 MG 30 15 1/2 CIV Insurance                                                           [place], DO   07/30/2022 09:21  [facility], Resident, PGY-3

## 2022-08-03 ENCOUNTER — Ambulatory Visit (INDEPENDENT_AMBULATORY_CARE_PROVIDER_SITE_OTHER): Payer: BC Managed Care – PPO | Admitting: Family Medicine

## 2022-08-03 ENCOUNTER — Other Ambulatory Visit: Payer: Self-pay

## 2022-08-03 ENCOUNTER — Encounter (INDEPENDENT_AMBULATORY_CARE_PROVIDER_SITE_OTHER): Payer: Self-pay | Admitting: Family Medicine

## 2022-08-03 VITALS — BP 118/72 | HR 83 | Resp 20 | Ht 70.98 in | Wt 214.0 lb

## 2022-08-03 DIAGNOSIS — Z8659 Personal history of other mental and behavioral disorders: Secondary | ICD-10-CM

## 2022-08-03 DIAGNOSIS — F1010 Alcohol abuse, uncomplicated: Secondary | ICD-10-CM

## 2022-08-03 DIAGNOSIS — Z23 Encounter for immunization: Secondary | ICD-10-CM

## 2022-08-03 DIAGNOSIS — E782 Mixed hyperlipidemia: Secondary | ICD-10-CM

## 2022-08-03 DIAGNOSIS — E119 Type 2 diabetes mellitus without complications: Secondary | ICD-10-CM

## 2022-08-03 DIAGNOSIS — Z7985 Long-term (current) use of injectable non-insulin antidiabetic drugs: Secondary | ICD-10-CM

## 2022-08-03 DIAGNOSIS — I10 Essential (primary) hypertension: Secondary | ICD-10-CM

## 2022-08-03 MED ORDER — NALTREXONE ER 380 MG INTRAMUSCULAR SUSPENSION,EXTENDED RELEASE
380.0000 mg | INTRAMUSCULAR | 5 refills | Status: DC
Start: 2022-08-03 — End: 2023-01-19

## 2022-08-03 NOTE — Progress Notes (Signed)
[address] [place] [address]  Dept Phone: [phone]  Dept Fax: [phone]    Lander Eslick  Apr 25, 1961  [id number]    Date of Service: 08/03/2022   Chief complaint:   Chief Complaint   Patient presents with    Follow Up 3 Months     HTN, Hyperlipidemia, DM II, Anxiety,Depression,Alcohol Abuse     Blood Work     Pt brought in lab work he had done        Subjective:   Patient presents today for routine 3 month followup to discuss his chronic medical problems which include his hypertension hyperlipidemia diabetes anxiety depression and history of alcohol abuse.  Overall patient is doing very well.  He is remaining active.  He is not had a drop of alcohol since January.  He states that he is losing weight he is playing tennis blood pressures been good mood is good really is doing very well.  He had a few questions which we discussed.  Otherwise no complaints no concerns.    Current Outpatient Medications   Medication Sig    acamprosate (CAMPRAL) 333 mg Oral Tablet, Delayed Release (E.C.) Pt takes 2 tablets  three times a day    amLODIPine (NORVASC) 10 mg Oral Tablet Take 1 Tablet (10 mg total) by mouth Once a day    aspirin 81 mg Oral Tablet, Chewable Take 1 Tab (81 mg total) by mouth Once a day    Blood Sugar Diagnostic (ONETOUCH VERIO TEST STRIPS) Strip 1 Strip Twice daily    Blood-Glucose Meter (ONETOUCH VERIO FLEX METER) Misc USE TO TEST GLUCOSE TWICE A DAY DX E11.9    cholecalciferol, vitamin D3, 1,000 unit Oral Tablet Take 1 Tablet (1,000 Units total) by mouth Once a day    colesevelam (WELCHOL) 625 mg Oral Tablet TAKE 3 TABLETS BY MOUTH TWICE A DAY WITH FOOD (Patient not taking: Reported on 08/03/2022)    DAILY-VITE Oral Tablet TAKE 1 TABLET BY MOUTH EVERY DAY    diazePAM (VALIUM) 5 mg Oral Tablet TAKE 1 TABLET BY MOUTH EVERY 12 HOURS AS NEEDED FOR ANXIETY    diphenhydrAMINE (BENADRYL) 25 mg Oral Capsule Take 1 Capsule (25 mg total) by mouth Every 6 hours as  needed    EPINEPHrine (EPIPEN) 0.3 mg/0.3 mL Injection Auto-Injector 0.3 mL (0.3 mg total) by Intramuscular route Once, as needed for up to 1 dose    fluticasone (FLONASE) 50 mcg/actuation Nasal Spray, Suspension 1 Spray by Each Nostril route Twice daily    folic acid (FOLVITE) 1 mg Oral Tablet TAKE 1 TABLET BY MOUTH EVERY DAY    glipiZIDE (GLUCOTROL) 5 mg Oral Tablet Take 1 Tablet (5 mg total) by mouth Every morning before breakfast Take 30 minutes before meals    lancets (LANCETS,THIN) 23 gauge Misc TEST GLUCOSE TWICE A DAY DX-E11.9    lansoprazole (PREVACID) 30 mg Oral Capsule, Delayed Release(E.C.) TAKE 1 CAPSULE BY MOUTH EVERY DAY INDICATIONS: GASTROESOPHAGEAL REFLUX DISEASE    loratadine (CLARITIN) 10 mg Oral Tablet Take 1 Tablet (10 mg total) by mouth Once a day    losartan (COZAAR) 50 mg Oral Tablet TAKE 1 TABLET BY MOUTH EVERY DAY    metoprolol succinate (TOPROL-XL) 25 mg Oral Tablet Sustained Release 24 hr Take 1 Tablet (25 mg total) by mouth Once a day    mirtazapine (REMERON) 15 mg Oral Tablet Take 1 Tablet (15 mg total) by mouth Every night    naltrexone  microspheres (VIVITROL) 380 mg IntraMUSCULAR Suspension,Sust.Release Recon Inject 4 mL (380 mg total) into the muscle Every 28 days    naphazoline-pheniramine (OPCON-A) 0.02675-0.315 % Ophthalmic Drops Instill 1 Drop into both eyes Once per day as needed    ondansetron (ZOFRAN) 4 mg Oral Tablet Take 1 Tab (4 mg total) by mouth Every 8 hours as needed for nausea/vomiting    PROAIR HFA 90 mcg/actuation Inhalation HFA Aerosol Inhaler Take 1-2 Puffs by inhalation Every 6 hours as needed    rosuvastatin (CRESTOR) 10 mg Oral Tablet Take 1 Tablet (10 mg total) by mouth Every evening    sertraline (ZOLOFT) 50 mg Oral Tablet Take 1 Tablet (50 mg total) by mouth Once a day    thiamine (VITAMIN B1) 100 mg Oral Tablet TAKE 1 TABLET BY MOUTH EVERY DAY    tirzepatide (MOUNJARO) 5 mg/0.5 mL Subcutaneous Pen Injector Inject 0.5 mL (5 mg total) under the skin Every 7  days       Objective:   BP 118/72   Pulse 83   Resp 20   Ht 1.803 m (5' 10.98")   Wt 97.1 kg (214 lb)   SpO2 95%   BMI 29.86 kg/m       General appearance: alert, oriented x 3, in his normal state, cooperative, not in apparent distress, appearing stated age   HEENT:  Eyes:  Pupils equal round react like accommodation extraocular muscles intact. Ears within normal limits throat clear, tonsils normal, no cervical lymphadenopathy thyroid normal  Lungs: clear to auscultation bilaterally, respirations non-labored  Heart: regular rate and rhythm, S1, S2 normal, no murmur, PMI non-diffuse  Abdomen: soft, non-tender. Bowel sounds normal.  Extremities: extremities normal, atraumatic, no cyanosis or edema, pulses intact in upper and lower extremities    Assessment     ENCOUNTER DIAGNOSES     ICD-10-CM   1. Type 2 diabetes mellitus without complication, without long-term current use of insulin (CMS HCC)  E11.9   2. Alcohol abuse  F10.10   3. Essential hypertension  I10   4. Mixed hyperlipidemia  E78.2       Plan     Orders Placed This Encounter    Prevnar 20 (PCV 20) (Admin)    naltrexone microspheres (VIVITROL) 380 mg IntraMUSCULAR Suspension,Sust.Release Recon           Patient is to continue on all current medications other than those changed in the orders.     Overall patient seems to be doing well.  As far as his diabetes he seems to be doing well.  Blood pressure is doing well.  Would continue with his activity obviously remain and refrain from alcohol.  Will recheck here in 3 months certainly sooner for problems.    Nelda Severe Mel Almond, M.D.

## 2022-08-05 ENCOUNTER — Ambulatory Visit (HOSPITAL_COMMUNITY): Payer: Self-pay | Admitting: Student in an Organized Health Care Education/Training Program

## 2022-08-11 ENCOUNTER — Other Ambulatory Visit (INDEPENDENT_AMBULATORY_CARE_PROVIDER_SITE_OTHER): Payer: Self-pay | Admitting: Family Medicine

## 2022-08-12 NOTE — Progress Notes (Signed)
[facility] Medicine  [facility] Medicine & Psychiatry  Outpatient Intake History & Physical    Patient name:  Danny [name]   Chart number:  [id number]  Date of birth:  May 22, 1961  Date of service:  08/19/2022      Identification:  Danny [name] is a 61 y.o. male from [place] [address]    Chief Complaint  - Establish care    ID:  Danny [name] is a 61 y.o. male from [place] [address]    History of Present Illness  This patient is a 61 y.o. White male with PPH of Alcohol use disorder, in remission, MDD and GAD who presents today to establish care. Patient was referred to [facility] outpatient clinic by PCP for management of potential med management. Patient used to follow with [facility] outpatient clinic. Was last seen by Dr.Chowdhary on 04/10/2020.    Patient reports that he has been dealing with depression and anxiety since she was 61 years old.  But mainly he has been struggling with alcohol abuse in the past which was exacerbating all his depressive and particularly anxiety symptoms.  States he has gone through multiple detox but most recent 1 was in January 2023 via [facility] in [place]. notes that he completed IOP and was in sober living after finishing detox. States that recovery was particularly helpful because he did it for himself.  States he has been able to maintain his sobriety since then.  States that he had not seen any psychiatrist for a long time his wanted to get re-established with Psychiatry and see if he can restart therapy again.    Regarding medication, states his PCP has been managing his medication including Vivitrol and Campral.  States he has been compliant with all his psych meds since his discharge.  Notes he has found Vivitrol and Campral to be very helpful to maintain his sobriety.  Most recent Vivitrol shot was yesterday.  Knows that he is interested in seeing if [facility] he is drug and alcohol intake can provide any additional resources for  him and he is considering transferring care if needed.    Reports apart from sobriety he has also made lifestyle changes which includes change in his diet and adding exercise in his lifestyle.  States he likes playing tennis and golf with his family.  States abstaining from drinking alcohol has made a huge difference in his life.    DEPRESSIVE SYMPTOMS: Symptoms have been stable, particularly since completing detox in Jan 2023.  Five+ w/in same 2 week period, nearly every day, ADLs affected  (-)         Sadness, emptiness, hopelessness, tearfulness  (-)         Anhedonia. States he likes exercising, playing Tennis and golf as well with his kids and wife  (-)         Decrease in appetite   (-)         Insomnia. Reports on average patient gets around 8 hr/night  (-)         Fatigue/ Low energy  (-)         Worthlessness/ excessive, inappropriate guilt  (-)         Poor concentration, poor thinking, indecisiveness,   (-)         Suicidal ideation. Denies prior SA or self harm behaviors. Admits to access to firearms at home. States he owns 3 total pistol and rifles at home.  MANIC SYMPTOMS: Denies  A. One week+ period, syx. Every or nearly every day; or if hospitalized  (-) elevated, expansive or irritable mood   (-)  increased activity or energy    B. Three or more of:   (-) grandiosity  (-) decreased need for sleep  (-) pressured speech/more talkative  (-) racing thoughts/flight of ideas  (-) distractibility  (-) increased goal directed activity  (-) Excessive involvement in high risk behavior.       ANXIETY SYMPTOMS: States it has been stable since Jan 2023. States Zoloft has been helpful. Currently , anxiety 1-2/10, 10 being the worst. States it used to be 7-8/10, 10 being the worst. States alcohol had made it worse.   (-) excessive worry.   (+) panic attacks: Last episode was Jan 2023. States he hasn't had any since then. Medication, sobtriety and lifestyle changes really helped with anxiety and panic  attack.    PSYCHOTIC SYMPTOMS: Denies  (-) auditory hallucinations.   (-) visual hallucinations  (-) paranoia    TRAUMA:    Patient denies history of significant trauma such as physical, emotional, or sexual abuse. Denies flashbacks, nightmares, hypervigilance, avoidance.       SUBSTANCE USE HISTORY:   Tobacco:  Denies  Cannabis: Denies recent use. Last use was several years ago, atleast 20 years ago.  Alcohol: None since he completed treatment in Jan 2023. Hx. Of alcohol abuse.   Benzodiazepines: States he has been taking as prescribed.   Opiates: Denies   Stimulants: Denies   Other: Denies     Life Stressors: Denies  Therapy: Interesting in therapy again.  Shared list of local therapy services via MyChart and also place patient in [facility] therapy wait list.  Support system: Wife,  and 2 sons (35 and 32 y.o)    Current meds:  -  Zoloft 50 mg daily anxiety and depression. Has been on it since Jan 2023. States it has been helpful.   -  Remeron  15 mg qhs for sleep. Has been on it since Dec 2022. Has found it to be helpful  -  Valium 5 mg daily q12h prn for anxiety. Last script sent on 03/20/22 by [facility]. States he used to take it before but since Jan he has cut down and hasn't really taken one since he was discharged. States he was taking more than he suppose to when he was struggling with alcohol abuse. States his wife was suggesting him to take it more.   - Campral 333 mg TID for AUD (severe, in remission)  - Vivitrol shot. Last received yesterday (08/18/22)    Med trials: Prozac (03/2020), Valium (02/2022 was on it for brief course), Wellbutrin  ROS:   All other review of systems negative except what is reported in HPI    Home Medications  I have reviewed the following med list for accuracy.    Current Outpatient Medications   Medication Sig Dispense Refill    acamprosate (CAMPRAL) 333 mg Oral Tablet, Delayed Release (E.C.) TAKE 2 TABLETS BY MOUTH 3 TIMES A DAY 180 Tablet 2    amLODIPine (NORVASC) 10 mg Oral  Tablet Take 1 Tablet (10 mg total) by mouth Once a day 90 Tablet 3    aspirin 81 mg Oral Tablet, Chewable Take 1 Tab (81 mg total) by mouth Once a day      Blood Sugar Diagnostic (ONETOUCH VERIO TEST STRIPS) Strip 1 Strip Twice daily 200 Each 3    Blood-Glucose Meter (  ONETOUCH VERIO FLEX METER) Misc USE TO TEST GLUCOSE TWICE A DAY DX E11.9 1 Each 0    cholecalciferol, vitamin D3, 1,000 unit Oral Tablet Take 1 Tablet (1,000 Units total) by mouth Once a day      DAILY-VITE Oral Tablet TAKE 1 TABLET BY MOUTH EVERY DAY 30 Tablet 11    diazePAM (VALIUM) 5 mg Oral Tablet TAKE 1 TABLET BY MOUTH EVERY 12 HOURS AS NEEDED FOR ANXIETY 30 Tablet 2    diphenhydrAMINE (BENADRYL) 25 mg Oral Capsule Take 1 Capsule (25 mg total) by mouth Every 6 hours as needed      EPINEPHrine (EPIPEN) 0.3 mg/0.3 mL Injection Auto-Injector 0.3 mL (0.3 mg total) by Intramuscular route Once, as needed for up to 1 dose 2 Each 0    fluticasone (FLONASE) 50 mcg/actuation Nasal Spray, Suspension 1 Spray by Each Nostril route Twice daily 16 g 5    folic acid (FOLVITE) 1 mg Oral Tablet TAKE 1 TABLET BY MOUTH EVERY DAY 30 Tablet 11    lancets (LANCETS,THIN) 23 gauge Misc TEST GLUCOSE TWICE A DAY DX-E11.9 200 Each 3    lansoprazole (PREVACID) 30 mg Oral Capsule, Delayed Release(E.C.) TAKE 1 CAPSULE BY MOUTH EVERY DAY INDICATIONS: GASTROESOPHAGEAL REFLUX DISEASE 90 Capsule 3    loratadine (CLARITIN) 10 mg Oral Tablet Take 1 Tablet (10 mg total) by mouth Once a day      losartan (COZAAR) 50 mg Oral Tablet TAKE 1 TABLET BY MOUTH EVERY DAY 90 Tablet 3    metoprolol succinate (TOPROL-XL) 25 mg Oral Tablet Sustained Release 24 hr Take 1 Tablet (25 mg total) by mouth Once a day 90 Tablet 3    mirtazapine (REMERON) 15 mg Oral Tablet Take 1 Tablet (15 mg total) by mouth Every night 90 Tablet 3    naltrexone microspheres (VIVITROL) 380 mg IntraMUSCULAR Suspension,Sust.Release Recon Inject 4 mL (380 mg total) into the muscle Every 28 days 4 mL 5     naphazoline-pheniramine (OPCON-A) 0.02675-0.315 % Ophthalmic Drops Instill 1 Drop into both eyes Once per day as needed      rosuvastatin (CRESTOR) 10 mg Oral Tablet Take 1 Tablet (10 mg total) by mouth Every evening 90 Tablet 3    sertraline (ZOLOFT) 50 mg Oral Tablet Take 1 Tablet (50 mg total) by mouth Once a day 90 Tablet 3    thiamine (VITAMIN B1) 100 mg Oral Tablet TAKE 1 TABLET BY MOUTH EVERY DAY 30 Tablet 11    tirzepatide (MOUNJARO) 5 mg/0.5 mL Subcutaneous Pen Injector Inject 0.5 mL (5 mg total) under the skin Every 7 days 2 mL 5     No current facility-administered medications for this visit.     Past Psychiatric History   Prior diagnoses: Alcohol use disorder (in remission), MDD and GAD  Prior medication trials:  Prozac (03/2020), Valium, Wellbutrin, Remeron, Paxil  Outpatient: PCP had been managing psych meds  Inpatient:  4x times at Detox facility. Most recent was in Jan 2023,  [facility], [place]  Suicide attempts/self-injury: Denies  Violence: Denies     Allergies  Allergies   Allergen Reactions    Percocet [Oxycodone] Anaphylaxis       Past Medical History   History of seizures: Denies epilepsy or history of withdrawal seizures  History of traumatic brain injury: Denies   Past Medical History:   Diagnosis Date    Anxiety     Elevated lipids     GERD (gastroesophageal reflux disease)     Hypertension  Panic attack     Sleep apnea     no cpap    Vomiting     Wears glasses        Past Surgical History  Past Surgical History:   Procedure Laterality Date    COLONOSCOPY  2015    [facility]    ESOPHAGOGASTRODUODENOSCOPY  2015    [facility]    GASTROSCOPY  07/28/2017    Dr. Jarome Lamas ELBOW SURGERY Left 2011    HX HAND SURGERY Left 06/2013    incision and drainage    HX HAND SURGERY Left     incision and drainage    HX WISDOM TEETH EXTRACTION           Social History   Living: Wife in [place]. Has 2 sons (84 and 42 y.o). 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  Substance abuse: None  Suicides/suicide attempts: Denies    Family Medical History:       Problem Relation (Age of Onset)    Alzheimer's/Dementia Father    Cancer Other, Mother, Sister    HTN <20 y.o. Other    Healthy Sister, Son, Son    Hypertension (High Blood Pressure) Father              Physical Exam    Vitals:    08/19/22 1622   BP: 116/79   Pulse: 78   SpO2: 98%   Weight: 97.8 kg (215 lb 9.8 oz)   Height: 1.803 m ('5\' 11"'$ )   BMI: 30.13           Vitals:    08/19/22 1622   BP: 116/79   Pulse: 78   SpO2: 98%   Weight: 97.8 kg (215 lb 9.8 oz)   Height: 1.803 m ('5\' 11"'$ )   BMI: 30.13          General: Appears in good health  Eyes: Pupils equal, round. EOM grossly intact. No nystagmus. Conjunctiva clear.  Respiratory: Regular rate. No increased work of breathing. No use of accessory muscles.  Cardiovascular: No swelling/edema of exposed extremities. No skin pallor.  Musculoskeletal: Gait/station as below. Moving all 4 extremities. No observed joint swelling.  Neuro: Alert, oriented to person, place, time, situation. No abnormal movements noted. No tremor.  Skin: Dry. No diaphoresis or flushing. No noticeable erythema, abrasions, or lesions on exposed skin.  Psych: Below.    Mental Status Exam  Appearance:  neatly dressed and dressed appropriately  Behavior:  cooperative  Motor/MS:  normal gait.   Speech:  normal  Mood:  euthymic "good"  Affect:  stable  Perception:  normal  Thought:  goal directed/coherent  Thought Content:  appropriate   Suicidal Ideation:  none.    Homicidal Ideation:  Denies  Attention/Concentration: grossly intact  Orientation: grossly oriented  Memory: recent and remote memory intact per interview  Language: no word-finding issues  Insight: good  Judgment:  good    Labs  Results in Last 18 Months   Lab Test 09/08/21  1033 01/28/22  0718 05/13/22  0829   TSH 3.016  --   --    AST 30   < > 22   ALT 40   < >  20   HA1C  --    < > 5.8*   CHOLESTEROL  --    < > 249*   HDLCHOL  --    < > 42*   LDLCHOL  --    < > 172*   TRIG  --    < > 186*    < > = values in this interval not displayed.       Psychiatric Diagnosis: Alcohol use disorder (Severe, in remission); MDD, partial remission; GAD  Medical comorbidities: DM2, GERD, HLD, HTN    Med trials: Prozac (03/2020), Valium (02/2022 was on it for brief course), Wellbutrin    Assessment  This patient is a 61 y.o. White male  with PPH of Alcohol use disorder, in remission, MDD and GAD who presents today to establish care. Patient was referred to [facility] outpatient clinic by PCP for management of potential med management. Patient used to follow with [facility] outpatient clinic. Was last seen by Dr.Chowdhary on 04/10/2020.  Today noted overall stability on his depressive and anxiety symptoms at he has been able to maintain his sobriety as well.  Denied any relapse on alcohol since he completed his detox in Jan 2023.  Noted going to sober living and completing IOP after his detox.  Noted history of completing multiple detox prior to this but reports this 1 was particularly helpful as he did it for himself.  Continues to be highly motivated to maintain his sobriety.  Currently all his psych meds including Vivitrol and Campral is being managed by his PCP but patient is interested in going over the resources that [facility] drug and alcohol intake clinic has to offer.  Plans to transfer his care to [facility] after having drug and alcohol intake if he finds further utility in doing that.  Denies any prior history of suicide attempts or self-harm behaviors.  Continues to denies any safety concerns including SI, HI or AVH.  Notes family to be a good support system.  Also motivated to establish with therapy at this time.  Overall agreeable to plan  below.      Plan  MDD:  - Continue Zoloft 50 mg daily for depression and anxiety  - Continue Remeron 15 mg qhs for sleep    GAD:  - Continue Zoloft 50 mg daily   - Continue Valium 5 mg daily prn   - Dr.Bailey (PCP) has been prescribing the medication   - Hasn't used it since Jan 2023 per patient. Was taking more than he was suppose to while he was in active addiction   - Understands risks related to mixing benzos with alcohol.   - Understands that Valium will likely be discontinued in the future if he follows with [facility] give his history    Alcohol use disorder (Severe, In remission):  - Continue Vivitrol 380 mg IM.   - Received yesterday on 09/19/22  - Continue Campral 333 mg TID   - Messaged [facility] case manager for Drug and Alcohol Intake  - Completed most recent detox on Jan 2023    - Supportive therapy provided and non-pharmacological modalities provided  - Reviewed daily routine, time management and sleep hygiene  - Reviewed coping skills and self care    Therapy:   - Supportive therapy provided.  - List of local therapy services was shared via [facility] clinic case managers and therapy case managers to place patient on waitlist   Safety:  - Patient denied acute  safety concerns. No SI/HI.  - Advised to report to nearest emergency department or to call 911 if having any suicidal or homicidal ideations.    Labs ordered:    - None ordered at this time.     Follow-up:   - Return to clinic in 2 months.    - Patient advised to call in the interim with questions/concerns and go to the ER is safety concerns arise.    Board of pharmacy:                                      Multi State Controlled Substance Patient Full Name Report Report Date 07/30/2022   From 07/30/2021 To 07/30/2022 Date of Birth March 01, 1961     Prescription Count 9   Last Name [place] First Name [place] Name                                   Patients included in report that appear to match the search criteria.   Last Name  First Name Middle Name Gender Address                                 Prescriber Name Prescriber [place] Name [place] Zip Rx Written Date Rx Dispense Date & Date Sold Rx Number Product Name Strength Qty Days # of Refill Sched Payment Type   ABSALOM, ARO   (Reported to [facility])   Payton Mccallum [id number] [place], L.L.C. [id number] 02/23/2022 02/23/2022 02/23/2022 [id number] TRAMADOL HCL 50 MG 14 7 0/0 CIV Private Pay   Liberty Handy (Dds) [id number] [place], L.L.C. [id number] 02/18/2022 02/18/2022 02/18/2022 [id number] Hydrocodone Bitartrate And Acetaminophen  7.5;MG- 325;MG  8 2 0/0 CII [address]   Wonda Horner [place] [id number] [place], L.L.C[facility] [id number] 10/08/2021 12/27/2021 12/31/2021 [id number] DIAZEPAM 5 MG 30 15 2/2 CIV Insurance   Wonda Horner [place] [id number] [place] [facility] [id number] [phone] 11/28/2021 11/28/2021 11/30/2021 [id number] Vyvanse '20MG'$  30 30 0/0 CII Insurance   Wonda Horner [place] [id number] [place], L.L.C. [id number] 10/08/2021 11/09/2021 11/09/2021 [id number] DIAZEPAM 5 MG 30 15 1/2 CIV Insurance   Wonda Horner [place] [id number] [place] [facility] [id number] 10/27/2021 10/27/2021 10/27/2021 [id number] Vyvanse '20MG'$  30 30 0/0 CII Insurance   Wonda Horner [place] [id number] [place], L.L.C. [id number] 10/08/2021 10/08/2021 10/09/2021 [id number] DIAZEPAM 5 MG 30 15 0/2 CIV Insurance   Wonda Horner [place] [id number] [place] [facility] [id number] 07/07/2021 09/20/2021 09/25/2021 [id number] DIAZEPAM 5 MG 30 15 2/2 CIV Insurance   Wonda Horner [place] [id number] [place] [facility] [id number] 07/07/2021 08/19/2021 08/20/2021 [id number] DIAZEPAM 5 MG 30 15 1/2 CIV Insurance                                                           [place]  Gaye Pollack, DO    08/20/2022 10:51  [place] [facility], Resident, PGY-3     I reviewed the resident's note.  I agree with the findings and plan of care as documented in the resident's note.  Any exceptions/additions are edited/noted.    Trevor Iha, MD

## 2022-08-13 ENCOUNTER — Encounter (INDEPENDENT_AMBULATORY_CARE_PROVIDER_SITE_OTHER): Payer: Self-pay | Admitting: Family Medicine

## 2022-08-14 ENCOUNTER — Other Ambulatory Visit (INDEPENDENT_AMBULATORY_CARE_PROVIDER_SITE_OTHER): Payer: Self-pay | Admitting: Family Medicine

## 2022-08-14 ENCOUNTER — Other Ambulatory Visit: Payer: Self-pay

## 2022-08-14 ENCOUNTER — Ambulatory Visit (INDEPENDENT_AMBULATORY_CARE_PROVIDER_SITE_OTHER): Payer: BC Managed Care – PPO

## 2022-08-14 DIAGNOSIS — F1010 Alcohol abuse, uncomplicated: Secondary | ICD-10-CM

## 2022-08-14 DIAGNOSIS — H527 Unspecified disorder of refraction: Secondary | ICD-10-CM

## 2022-08-14 MED ORDER — NALTREXONE 50 MG TABLET
50.0000 mg | ORAL_TABLET | Freq: Every day | ORAL | 1 refills | Status: DC
Start: 2022-08-14 — End: 2022-08-19

## 2022-08-17 NOTE — Progress Notes (Signed)
Error. Lincoln Brigham, LPN  7/56/4332 95:18

## 2022-08-18 ENCOUNTER — Other Ambulatory Visit (INDEPENDENT_AMBULATORY_CARE_PROVIDER_SITE_OTHER): Payer: Self-pay | Admitting: Family Medicine

## 2022-08-18 ENCOUNTER — Ambulatory Visit (INDEPENDENT_AMBULATORY_CARE_PROVIDER_SITE_OTHER): Payer: BC Managed Care – PPO

## 2022-08-18 ENCOUNTER — Other Ambulatory Visit: Payer: Self-pay

## 2022-08-18 DIAGNOSIS — F1010 Alcohol abuse, uncomplicated: Secondary | ICD-10-CM

## 2022-08-18 NOTE — Progress Notes (Signed)
Vivatrol injection given in left hip per order Lincoln Brigham, LPN  3/66/8159 47:07

## 2022-08-18 NOTE — Nursing Note (Signed)
08/18/22 1100   Medication Administration   Initials cb   Medication  Vivitrol   Medication Dose Given '380mg'$    Medication Volume Given (mL) '380mg'$    Route of Administration IM   Site Left Hip   NDC # [id number]   LOT # [id number]   Expiration date 11/19/24   Manufacturer [place] No   Patient Supplied Yes

## 2022-08-19 ENCOUNTER — Encounter (HOSPITAL_COMMUNITY): Payer: Self-pay | Admitting: Student in an Organized Health Care Education/Training Program

## 2022-08-19 ENCOUNTER — Ambulatory Visit (HOSPITAL_COMMUNITY): Payer: BC Managed Care – PPO | Admitting: Student in an Organized Health Care Education/Training Program

## 2022-08-19 VITALS — BP 116/79 | HR 78 | Ht 71.0 in | Wt 215.6 lb

## 2022-08-19 DIAGNOSIS — F329 Major depressive disorder, single episode, unspecified: Secondary | ICD-10-CM

## 2022-08-19 DIAGNOSIS — F411 Generalized anxiety disorder: Secondary | ICD-10-CM

## 2022-08-19 DIAGNOSIS — F324 Major depressive disorder, single episode, in partial remission: Secondary | ICD-10-CM

## 2022-08-19 DIAGNOSIS — F1011 Alcohol abuse, in remission: Secondary | ICD-10-CM

## 2022-08-20 ENCOUNTER — Ambulatory Visit (HOSPITAL_COMMUNITY): Payer: Self-pay

## 2022-08-20 NOTE — Telephone Encounter (Signed)
CM returned patient phone call to discuss process for scheduling Addiction Services Intake but there was no answer. Voicemail was left with call back number to discuss process for scheduling appointment.    Patient placed on Addiction Services Intake wait list.    Thana Farr, CASE MANAGER  08/20/2022, 13:01

## 2022-09-18 ENCOUNTER — Other Ambulatory Visit: Payer: Self-pay

## 2022-09-18 ENCOUNTER — Ambulatory Visit (INDEPENDENT_AMBULATORY_CARE_PROVIDER_SITE_OTHER): Payer: BC Managed Care – PPO

## 2022-09-18 DIAGNOSIS — F1010 Alcohol abuse, uncomplicated: Secondary | ICD-10-CM

## 2022-09-18 NOTE — Nursing Note (Signed)
09/18/22 0900   Medication Administration   Initials SM   Witness Initials CB   Medication  Vivitrol   Medication Dose Given 52m   Medication Volume Given (mL) 420m  Route of Administration IM   Site Right Hip   NDC # [id number] LOT # [id number] Expiration date 12/21/24   Manufacturer [place]   Patient Supplied Yes

## 2022-09-18 NOTE — Progress Notes (Signed)
Gave Vivatrol injection in right hip Sheila Minick, MA

## 2022-09-21 MED ORDER — NALTREXONE ER 380 MG INTRAMUSCULAR SUSPENSION,EXTENDED RELEASE
380.0000 mg | INTRAMUSCULAR | Status: AC
Start: 2022-09-21 — End: 2023-08-23
  Administered 2022-11-11 – 2023-02-09 (×4): 380 mg via INTRAMUSCULAR

## 2022-10-21 ENCOUNTER — Encounter (HOSPITAL_COMMUNITY): Payer: BC Managed Care – PPO | Admitting: Student in an Organized Health Care Education/Training Program

## 2022-11-09 ENCOUNTER — Ambulatory Visit (INDEPENDENT_AMBULATORY_CARE_PROVIDER_SITE_OTHER): Payer: BC Managed Care – PPO

## 2022-11-09 ENCOUNTER — Other Ambulatory Visit: Payer: Self-pay

## 2022-11-11 ENCOUNTER — Other Ambulatory Visit: Payer: Self-pay

## 2022-11-11 ENCOUNTER — Ambulatory Visit (INDEPENDENT_AMBULATORY_CARE_PROVIDER_SITE_OTHER): Payer: BC Managed Care – PPO

## 2022-11-11 DIAGNOSIS — F1010 Alcohol abuse, uncomplicated: Secondary | ICD-10-CM

## 2022-11-11 NOTE — Research Notes (Signed)
Vivitrol 380 mg IM Injection given in Rt hip. Patient tolerated well.

## 2022-11-17 ENCOUNTER — Ambulatory Visit (INDEPENDENT_AMBULATORY_CARE_PROVIDER_SITE_OTHER): Payer: BC Managed Care – PPO | Admitting: Family Medicine

## 2022-11-17 ENCOUNTER — Encounter (INDEPENDENT_AMBULATORY_CARE_PROVIDER_SITE_OTHER): Payer: Self-pay | Admitting: Family Medicine

## 2022-11-17 ENCOUNTER — Other Ambulatory Visit: Payer: Self-pay

## 2022-11-17 VITALS — BP 122/72 | HR 84 | Resp 18 | Ht 70.98 in | Wt 209.0 lb

## 2022-11-17 DIAGNOSIS — E782 Mixed hyperlipidemia: Secondary | ICD-10-CM

## 2022-11-17 DIAGNOSIS — Z23 Encounter for immunization: Secondary | ICD-10-CM

## 2022-11-17 DIAGNOSIS — I10 Essential (primary) hypertension: Secondary | ICD-10-CM

## 2022-11-17 DIAGNOSIS — R748 Abnormal levels of other serum enzymes: Secondary | ICD-10-CM

## 2022-11-17 DIAGNOSIS — E119 Type 2 diabetes mellitus without complications: Secondary | ICD-10-CM

## 2022-11-17 DIAGNOSIS — F1010 Alcohol abuse, uncomplicated: Secondary | ICD-10-CM

## 2022-11-17 NOTE — Progress Notes (Signed)
[address] [place] [address]  Dept Phone: [phone]  Dept Fax: [phone]    Danny [name]  1961/10/05  [id number]    Date of Service: 11/17/2022   Chief complaint:   Chief Complaint   Patient presents with    Follow Up 3 Months     HTN, Hyperlipidemia, DM II, Anxiety,Depression,Alcohol Abuse     Blood Work     Pt isn't fasting - pt needs HA1C     Stomach Ache     Pt c/o stomach discomfort , nausea , upset , burning    Eye Exam     Pt has a appointment coming up        Subjective:   Patient presents today for routine follow-up of his chronic medical problems which include his diabetes hyperlipidemia hypertension and history of alcohol abuse.  Overall patient continues to do very well.  He is continuing off alcohol and is doing much better.  He continues to have GERD symptoms he had an endoscopy done last about 3 years ago.  He is dealt with GERD symptoms since college.  As far as his blood pressures been good he had blood work done in August which his lipid panel and A1c were excellent.  Patient really does not have any complaints or concerns other than his GERD symptoms.    Current Outpatient Medications   Medication Sig    acamprosate (CAMPRAL) 333 mg Oral Tablet, Delayed Release (E.C.) TAKE 2 TABLETS BY MOUTH 3 TIMES A DAY    amLODIPine (NORVASC) 10 mg Oral Tablet Take 1 Tablet (10 mg total) by mouth Once a day    aspirin 81 mg Oral Tablet, Chewable Take 1 Tab (81 mg total) by mouth Once a day    Blood Sugar Diagnostic (ONETOUCH VERIO TEST STRIPS) Strip 1 Strip Twice daily    Blood-Glucose Meter (ONETOUCH VERIO FLEX METER) Misc USE TO TEST GLUCOSE TWICE A DAY DX E11.9    cholecalciferol, vitamin D3, 1,000 unit Oral Tablet Take 1 Tablet (1,000 Units total) by mouth Once a day    DAILY-VITE Oral Tablet TAKE 1 TABLET BY MOUTH EVERY DAY    diazePAM (VALIUM) 5 mg Oral Tablet TAKE 1 TABLET BY MOUTH EVERY 12 HOURS AS NEEDED FOR ANXIETY    diphenhydrAMINE (BENADRYL)  25 mg Oral Capsule Take 1 Capsule (25 mg total) by mouth Every 6 hours as needed    EPINEPHrine (EPIPEN) 0.3 mg/0.3 mL Injection Auto-Injector 0.3 mL (0.3 mg total) by Intramuscular route Once, as needed for up to 1 dose    fluticasone (FLONASE) 50 mcg/actuation Nasal Spray, Suspension 1 Spray by Each Nostril route Twice daily    folic acid (FOLVITE) 1 mg Oral Tablet TAKE 1 TABLET BY MOUTH EVERY DAY    lancets (LANCETS,THIN) 23 gauge Misc TEST GLUCOSE TWICE A DAY DX-E11.9    lansoprazole (PREVACID) 30 mg Oral Capsule, Delayed Release(E.C.) TAKE 1 CAPSULE BY MOUTH EVERY DAY INDICATIONS: GASTROESOPHAGEAL REFLUX DISEASE    loratadine (CLARITIN) 10 mg Oral Tablet Take 1 Tablet (10 mg total) by mouth Once a day    losartan (COZAAR) 50 mg Oral Tablet TAKE 1 TABLET BY MOUTH EVERY DAY    metoprolol succinate (TOPROL-XL) 25 mg Oral Tablet Sustained Release 24 hr Take 1 Tablet (25 mg total) by mouth Once a day    mirtazapine (REMERON) 15 mg Oral Tablet Take 1 Tablet (15 mg total) by mouth Every night    naltrexone  microspheres (VIVITROL) 380 mg IntraMUSCULAR Suspension,Sust.Release Recon Inject 4 mL (380 mg total) into the muscle Every 28 days    naphazoline-pheniramine (OPCON-A) 0.02675-0.315 % Ophthalmic Drops Instill 1 Drop into both eyes Once per day as needed    rosuvastatin (CRESTOR) 10 mg Oral Tablet Take 1 Tablet (10 mg total) by mouth Every evening    sertraline (ZOLOFT) 50 mg Oral Tablet Take 1 Tablet (50 mg total) by mouth Once a day    thiamine (VITAMIN B1) 100 mg Oral Tablet TAKE 1 TABLET BY MOUTH EVERY DAY    tirzepatide (MOUNJARO) 5 mg/0.5 mL Subcutaneous Pen Injector Inject 0.5 mL (5 mg total) under the skin Every 7 days       Objective:   BP 122/72   Pulse 84   Resp 18   Ht 1.803 m (5' 10.98")   Wt 94.8 kg (209 lb)   SpO2 97%   BMI 29.16 kg/m       General appearance: alert, oriented x 3, in his normal state, cooperative, not in apparent distress, appearing stated age   HEENT:  Eyes:  Pupils equal  round react like accommodation extraocular muscles intact. Ears within normal limits throat clear, tonsils normal, no cervical lymphadenopathy thyroid normal  Lungs: clear to auscultation bilaterally, respirations non-labored  Heart: regular rate and rhythm, S1, S2 normal, no murmur, PMI non-diffuse  Abdomen: soft, non-tender. Bowel sounds normal.  Extremities: extremities normal, atraumatic, no cyanosis or edema, pulses intact in upper and lower extremities    Assessment     ENCOUNTER DIAGNOSES     ICD-10-CM   1. Alcohol abuse  F10.10   2. Type 2 diabetes mellitus without complication, without long-term current use of insulin (CMS HCC)  E11.9   3. Essential hypertension  I10   4. Mixed hyperlipidemia  E78.2   5. Elevated liver enzymes  R74.8       Plan     Orders Placed This Encounter    Flu Vaccine, 6 month-adult,0.5 mL IM (Admin)                   Patient is to continue on all current medications other than those changed in the orders.     Overall patient is doing well.  He is not drinking alcohol at all anymore which has made a huge difference.  He is lost weight.  His diabetes is doing very well his last hemoglobin A1c was 5.8.  His blood pressure is excellent.  His lipids were excellent.  Hopefully he will continue off of alcohol and his overall health will continue to improve.  Will have patient recheck here in 3 months certainly sooner for problems.    Nelda Severe Mel Almond, M.D.

## 2022-11-24 ENCOUNTER — Other Ambulatory Visit (INDEPENDENT_AMBULATORY_CARE_PROVIDER_SITE_OTHER): Payer: Self-pay | Admitting: Family Medicine

## 2022-11-30 ENCOUNTER — Encounter (INDEPENDENT_AMBULATORY_CARE_PROVIDER_SITE_OTHER): Payer: Self-pay | Admitting: Family Medicine

## 2022-12-09 ENCOUNTER — Ambulatory Visit (INDEPENDENT_AMBULATORY_CARE_PROVIDER_SITE_OTHER): Payer: BC Managed Care – PPO

## 2022-12-09 ENCOUNTER — Other Ambulatory Visit: Payer: Self-pay

## 2022-12-09 DIAGNOSIS — F1010 Alcohol abuse, uncomplicated: Secondary | ICD-10-CM

## 2022-12-09 NOTE — Progress Notes (Signed)
Gave Vivatrol injection in right hip Lady Gary, MA

## 2022-12-11 ENCOUNTER — Other Ambulatory Visit (INDEPENDENT_AMBULATORY_CARE_PROVIDER_SITE_OTHER): Payer: Self-pay | Admitting: Family Medicine

## 2022-12-30 ENCOUNTER — Encounter (INDEPENDENT_AMBULATORY_CARE_PROVIDER_SITE_OTHER): Payer: Self-pay | Admitting: Family Medicine

## 2022-12-31 ENCOUNTER — Other Ambulatory Visit (INDEPENDENT_AMBULATORY_CARE_PROVIDER_SITE_OTHER): Payer: Self-pay | Admitting: Family Medicine

## 2023-01-12 ENCOUNTER — Ambulatory Visit (INDEPENDENT_AMBULATORY_CARE_PROVIDER_SITE_OTHER): Payer: BC Managed Care – PPO

## 2023-01-12 ENCOUNTER — Other Ambulatory Visit: Payer: Self-pay

## 2023-01-12 ENCOUNTER — Encounter (INDEPENDENT_AMBULATORY_CARE_PROVIDER_SITE_OTHER): Payer: Self-pay | Admitting: Family Medicine

## 2023-01-12 DIAGNOSIS — F1010 Alcohol abuse, uncomplicated: Secondary | ICD-10-CM

## 2023-01-12 NOTE — Progress Notes (Signed)
Pt in for Vivitrol injection. Given in left hip Lincoln Brigham, LPN  0/37/0964 38:38

## 2023-01-19 ENCOUNTER — Other Ambulatory Visit (INDEPENDENT_AMBULATORY_CARE_PROVIDER_SITE_OTHER): Payer: Self-pay | Admitting: Family Medicine

## 2023-01-19 DIAGNOSIS — F1010 Alcohol abuse, uncomplicated: Secondary | ICD-10-CM

## 2023-01-30 ENCOUNTER — Other Ambulatory Visit (INDEPENDENT_AMBULATORY_CARE_PROVIDER_SITE_OTHER): Payer: Self-pay | Admitting: Family Medicine

## 2023-02-01 MED ORDER — AMLODIPINE 10 MG TABLET
10.0000 mg | ORAL_TABLET | Freq: Every day | ORAL | 3 refills | Status: DC
Start: 2023-02-01 — End: 2023-05-27

## 2023-02-02 ENCOUNTER — Encounter (INDEPENDENT_AMBULATORY_CARE_PROVIDER_SITE_OTHER): Payer: Self-pay | Admitting: Family Medicine

## 2023-02-03 ENCOUNTER — Other Ambulatory Visit (INDEPENDENT_AMBULATORY_CARE_PROVIDER_SITE_OTHER): Payer: Self-pay | Admitting: Family Medicine

## 2023-02-06 ENCOUNTER — Other Ambulatory Visit (INDEPENDENT_AMBULATORY_CARE_PROVIDER_SITE_OTHER): Payer: Self-pay | Admitting: Family Medicine

## 2023-02-09 ENCOUNTER — Ambulatory Visit (INDEPENDENT_AMBULATORY_CARE_PROVIDER_SITE_OTHER): Payer: BC Managed Care – PPO

## 2023-02-09 ENCOUNTER — Other Ambulatory Visit: Payer: Self-pay

## 2023-02-09 DIAGNOSIS — F1021 Alcohol dependence, in remission: Secondary | ICD-10-CM

## 2023-02-09 NOTE — Progress Notes (Signed)
Vivitrol injection given per order . Lincoln Brigham, LPN  [phone]

## 2023-02-17 ENCOUNTER — Other Ambulatory Visit (INDEPENDENT_AMBULATORY_CARE_PROVIDER_SITE_OTHER): Payer: Self-pay | Admitting: Family Medicine

## 2023-02-17 MED ORDER — DIAZEPAM 5 MG TABLET
5.0000 mg | ORAL_TABLET | Freq: Two times a day (BID) | ORAL | 2 refills | Status: AC | PRN
Start: 2023-02-17 — End: ?

## 2023-02-23 ENCOUNTER — Ambulatory Visit (INDEPENDENT_AMBULATORY_CARE_PROVIDER_SITE_OTHER): Payer: BC Managed Care – PPO | Admitting: Family Medicine

## 2023-02-23 ENCOUNTER — Encounter (INDEPENDENT_AMBULATORY_CARE_PROVIDER_SITE_OTHER): Payer: Self-pay | Admitting: Family Medicine

## 2023-02-23 ENCOUNTER — Other Ambulatory Visit: Payer: Self-pay

## 2023-02-23 ENCOUNTER — Other Ambulatory Visit: Payer: BC Managed Care – PPO | Attending: Family Medicine | Admitting: Family Medicine

## 2023-02-23 ENCOUNTER — Ambulatory Visit (INDEPENDENT_AMBULATORY_CARE_PROVIDER_SITE_OTHER): Payer: BC Managed Care – PPO

## 2023-02-23 VITALS — BP 118/78 | HR 76 | Resp 18 | Ht 70.98 in | Wt 217.2 lb

## 2023-02-23 DIAGNOSIS — F1021 Alcohol dependence, in remission: Secondary | ICD-10-CM

## 2023-02-23 DIAGNOSIS — E119 Type 2 diabetes mellitus without complications: Secondary | ICD-10-CM

## 2023-02-23 DIAGNOSIS — E782 Mixed hyperlipidemia: Secondary | ICD-10-CM

## 2023-02-23 DIAGNOSIS — Z1211 Encounter for screening for malignant neoplasm of colon: Secondary | ICD-10-CM

## 2023-02-23 DIAGNOSIS — I10 Essential (primary) hypertension: Secondary | ICD-10-CM

## 2023-02-23 DIAGNOSIS — Z125 Encounter for screening for malignant neoplasm of prostate: Secondary | ICD-10-CM

## 2023-02-23 DIAGNOSIS — Z7985 Long-term (current) use of injectable non-insulin antidiabetic drugs: Secondary | ICD-10-CM

## 2023-02-23 LAB — COMPREHENSIVE METABOLIC PNL, FASTING
ALBUMIN: 4.1 g/dL (ref 3.4–4.8)
ALKALINE PHOSPHATASE: 91 U/L (ref 45–115)
ALT (SGPT): 17 U/L (ref 10–55)
ANION GAP: 8 mmol/L (ref 4–13)
AST (SGOT): 24 U/L (ref 8–45)
BILIRUBIN TOTAL: 0.3 mg/dL (ref 0.3–1.3)
BUN/CREA RATIO: 7 (ref 6–22)
BUN: 8 mg/dL (ref 8–25)
CALCIUM: 9.9 mg/dL (ref 8.6–10.3)
CHLORIDE: 104 mmol/L (ref 96–111)
CO2 TOTAL: 27 mmol/L (ref 23–31)
CREATININE: 1.12 mg/dL (ref 0.75–1.35)
ESTIMATED GFR - MALE: 75 mL/min/BSA (ref >=60)
GLUCOSE: 100 mg/dL — ABNORMAL HIGH (ref 70–99)
POTASSIUM: 4.3 mmol/L (ref 3.5–5.1)
PROTEIN TOTAL: 7.5 g/dL (ref 6.0–8.0)
SODIUM: 139 mmol/L (ref 136–145)

## 2023-02-23 LAB — LIPID PANEL
CHOL/HDL RATIO: 3.7
CHOLESTEROL: 148 mg/dL (ref 100–200)
HDL CHOL: 40 mg/dL — ABNORMAL LOW (ref >=50)
LDL CALC: 78 mg/dL (ref <100)
NON-HDL: 108 mg/dL (ref <=190)
TRIGLYCERIDES: 179 mg/dL — ABNORMAL HIGH (ref <150)
VLDL CALC: 27 mg/dL (ref <30)

## 2023-02-23 LAB — CBC
HCT: 49.5 % (ref 38.9–52.0)
HGB: 16.3 g/dL (ref 13.4–17.5)
MCH: 29.7 pg (ref 26.0–32.0)
MCHC: 32.9 g/dL (ref 31.0–35.5)
MCV: 90.2 fL (ref 78.0–100.0)
MPV: 9.8 fL (ref 8.7–12.5)
PLATELETS: 274 10*3/uL (ref 150–400)
RBC: 5.49 10*6/uL (ref 4.50–6.10)
RDW-CV: 13.9 % (ref 11.5–15.5)
WBC: 8.6 10*3/uL (ref 3.7–11.0)

## 2023-02-23 LAB — PSA SCREENING: PSA: 0.24 ng/mL (ref <=4.00)

## 2023-02-23 LAB — HGA1C (HEMOGLOBIN A1C WITH EST AVG GLUCOSE)
ESTIMATED AVERAGE GLUCOSE: 114 mg/dL
HEMOGLOBIN A1C: 5.6 % (ref 4.1–5.7)

## 2023-02-23 MED ORDER — MOUNJARO 2.5 MG/0.5 ML SUBCUTANEOUS PEN INJECTOR
2.5000 mg | PEN_INJECTOR | SUBCUTANEOUS | 3 refills | Status: DC
Start: 2023-02-23 — End: 2023-06-02

## 2023-02-23 NOTE — Progress Notes (Signed)
[address] [place] [address]  Dept Phone: [phone]  Dept Fax: [phone]    Danny [name]  11-Aug-1961  [id number]    Date of Service: 02/23/2023   Chief complaint:   Chief Complaint   Patient presents with    Follow Up 3 Months     Type 2 diabetes, alcohol abuse, hypertension, hyperlipidemia, elevated liver enzymes    Blood Work     Patient is fasting.     Cologuard     Never had a clear colonoscopy    Medication Check     Wants to discuss Mounjaro. Working well, but wondering about long term effects. Also wants to discuss getting pills to replace Vivatrol shot, due to being out of town when due next.       Subjective:   Patient presents today for routine 3 month followup to discuss his chronic medical problems which include his diabetes hypertension hyperlipidemia elevated liver enzymes and history of alcohol abuse.  Overall patient is doing well.  He has still not drinking.  He is lost almost 20 lb he feels better he is more active he is doing well on his current regimen as far as his diabetes.    Current Outpatient Medications   Medication Sig    acamprosate (CAMPRAL) 333 mg Oral Tablet, Delayed Release (E.C.) TAKE 2 TABLETS BY MOUTH 3 TIMES A DAY    amLODIPine (NORVASC) 10 mg Oral Tablet Take 1 Tablet (10 mg total) by mouth Once a day    aspirin 81 mg Oral Tablet, Chewable Take 1 Tab (81 mg total) by mouth Once a day    cholecalciferol, vitamin D3, 1,000 unit Oral Tablet Take 1 Tablet (1,000 Units total) by mouth Once a day    DAILY-VITE Oral Tablet TAKE 1 TABLET BY MOUTH EVERY DAY    diazePAM (VALIUM) 5 mg Oral Tablet Take 1 Tablet (5 mg total) by mouth Every 12 hours as needed for Anxiety    diphenhydrAMINE (BENADRYL) 25 mg Oral Capsule Take 1 Capsule (25 mg total) by mouth Every 6 hours as needed    EPINEPHrine (EPIPEN) 0.3 mg/0.3 mL Injection Auto-Injector 0.3 mL (0.3 mg total) by Intramuscular route Once, as needed for up to 1 dose    fluticasone  (FLONASE) 50 mcg/actuation Nasal Spray, Suspension 1 Spray by Each Nostril route Twice daily    folic acid (FOLVITE) 1 mg Oral Tablet TAKE 1 TABLET BY MOUTH EVERY DAY    lansoprazole (PREVACID) 30 mg Oral Capsule, Delayed Release(E.C.) TAKE 1 CAPSULE BY MOUTH EVERY DAY INDICATIONS: GASTROESOPHAGEAL REFLUX DISEASE    loratadine (CLARITIN) 10 mg Oral Tablet Take 1 Tablet (10 mg total) by mouth Once a day    losartan (COZAAR) 50 mg Oral Tablet TAKE 1 TABLET BY MOUTH EVERY DAY    metoprolol succinate (TOPROL-XL) 25 mg Oral Tablet Sustained Release 24 hr TAKE 1 TABLET BY MOUTH ONCE A DAY    mirtazapine (REMERON) 15 mg Oral Tablet TAKE 1 TABLET BY MOUTH EVERY NIGHT.    naphazoline-pheniramine (OPCON-A) 0.02675-0.315 % Ophthalmic Drops Instill 1 Drop into both eyes Once per day as needed    rosuvastatin (CRESTOR) 10 mg Oral Tablet Take 1 Tablet (10 mg total) by mouth Every evening    sertraline (ZOLOFT) 50 mg Oral Tablet TAKE 1 TABLET BY MOUTH EVERY DAY    thiamine (VITAMIN B1) 100 mg Oral Tablet TAKE 1 TABLET BY MOUTH EVERY DAY    tirzepatide [facility])  2.5 mg/0.5 mL Subcutaneous Pen Injector Inject 0.5 mL (2.5 mg total) under the skin Every 7 days    VIVITROL 380 mg IntraMUSCULAR Suspension,Sust.Release Recon INJECT 380 MG INTO THE MUSCLE EVERY 28 DAYS       Objective:   BP 118/78   Pulse 76   Resp 18   Ht 1.803 m (5' 10.98")   Wt 98.5 kg (217 lb 3.2 oz)   SpO2 96%   BMI 30.31 kg/m       General appearance: alert, oriented x 3, in his normal state, cooperative, not in apparent distress, appearing stated age   HEENT:  Eyes:  Pupils equal round react like accommodation extraocular muscles intact. Ears within normal limits throat clear, tonsils normal, no cervical lymphadenopathy thyroid normal  Lungs: clear to auscultation bilaterally, respirations non-labored  Heart: regular rate and rhythm, S1, S2 normal, no murmur, PMI non-diffuse  Abdomen: soft, non-tender. Bowel sounds normal.  Extremities: extremities normal,  atraumatic, no cyanosis or edema, pulses intact in upper and lower extremities    Assessment     ENCOUNTER DIAGNOSES     ICD-10-CM   1. Screening for colon cancer  Z12.11   2. Alcohol use disorder, severe, in early remission (CMS HCC)  F10.21   3. Type 2 diabetes mellitus without complication, without long-term current use of insulin (CMS HCC)  E11.9   4. Essential hypertension  I10   5. Mixed hyperlipidemia  E78.2   6. Screening for prostate cancer  Z12.5       Plan     Orders Placed This Encounter    FECAL DNA TESTING (AMB)    CBC    COMPREHENSIVE METABOLIC PNL, FASTING    123456 (HEMOGLOBIN A1C WITH EST AVG GLUCOSE)    LIPID PANEL    PSA SCREENING    tirzepatide (MOUNJARO) 2.5 mg/0.5 mL Subcutaneous Pen Injector                 Patient is to continue on all current medications other than those changed in the orders.     Overall patient seems to be doing well we are going to get lab work today and decide where to go from there he would like to go back to the 2.5 mg mounjaro since he feels that it was working just as well as the 5 mg so going to go ahead and try that.  We are going to check routine blood work and will call patient with results.    Nelda Severe Mel Almond, M.D.

## 2023-04-16 ENCOUNTER — Other Ambulatory Visit (INDEPENDENT_AMBULATORY_CARE_PROVIDER_SITE_OTHER): Payer: Self-pay | Admitting: Family Medicine

## 2023-04-16 ENCOUNTER — Encounter (INDEPENDENT_AMBULATORY_CARE_PROVIDER_SITE_OTHER): Payer: Self-pay | Admitting: Family Medicine

## 2023-04-16 DIAGNOSIS — F1010 Alcohol abuse, uncomplicated: Secondary | ICD-10-CM

## 2023-04-16 MED ORDER — NALTREXONE ER 380 MG INTRAMUSCULAR SUSPENSION,EXTENDED RELEASE
INTRAMUSCULAR | 5 refills | Status: DC
Start: 2023-04-16 — End: 2023-09-15

## 2023-04-22 ENCOUNTER — Other Ambulatory Visit (INDEPENDENT_AMBULATORY_CARE_PROVIDER_SITE_OTHER): Payer: Self-pay | Admitting: Family Medicine

## 2023-04-22 MED ORDER — ACAMPROSATE 333 MG TABLET,DELAYED RELEASE
DELAYED_RELEASE_TABLET | ORAL | 5 refills | Status: DC
Start: 2023-04-22 — End: 2023-10-25

## 2023-05-10 ENCOUNTER — Other Ambulatory Visit (INDEPENDENT_AMBULATORY_CARE_PROVIDER_SITE_OTHER): Payer: Self-pay | Admitting: Family Medicine

## 2023-05-10 ENCOUNTER — Encounter (INDEPENDENT_AMBULATORY_CARE_PROVIDER_SITE_OTHER): Payer: Self-pay | Admitting: Family Medicine

## 2023-05-10 MED ORDER — ROSUVASTATIN 10 MG TABLET
10.0000 mg | ORAL_TABLET | Freq: Every evening | ORAL | 3 refills | Status: DC
Start: 2023-05-10 — End: 2024-09-18

## 2023-05-27 ENCOUNTER — Ambulatory Visit (INDEPENDENT_AMBULATORY_CARE_PROVIDER_SITE_OTHER): Payer: BC Managed Care – PPO | Admitting: Family Medicine

## 2023-05-27 ENCOUNTER — Encounter (INDEPENDENT_AMBULATORY_CARE_PROVIDER_SITE_OTHER): Payer: Self-pay | Admitting: Family Medicine

## 2023-05-27 ENCOUNTER — Other Ambulatory Visit: Payer: Self-pay

## 2023-05-27 VITALS — BP 112/77 | HR 84 | Resp 18 | Ht 70.98 in | Wt 216.0 lb

## 2023-05-27 DIAGNOSIS — I10 Essential (primary) hypertension: Secondary | ICD-10-CM

## 2023-05-27 DIAGNOSIS — E119 Type 2 diabetes mellitus without complications: Secondary | ICD-10-CM

## 2023-05-27 DIAGNOSIS — Z0000 Encounter for general adult medical examination without abnormal findings: Secondary | ICD-10-CM

## 2023-05-27 DIAGNOSIS — E782 Mixed hyperlipidemia: Secondary | ICD-10-CM

## 2023-05-27 DIAGNOSIS — F1010 Alcohol abuse, uncomplicated: Secondary | ICD-10-CM

## 2023-05-27 MED ORDER — AMLODIPINE 5 MG TABLET
5.0000 mg | ORAL_TABLET | Freq: Every day | ORAL | 1 refills | Status: DC
Start: 2023-05-27 — End: 2023-09-15

## 2023-05-27 NOTE — Progress Notes (Signed)
[facility]  [address]  [place] [address]  Dept Phone: [phone]  Dept Fax: [phone]    Danny [name]  [birth year]  [id number]    Date of Service: 05/27/2023   Chief complaint:   Chief Complaint   Patient presents with    Follow Up 3 Months       Type 2 diabetes, alcohol abuse, hypertension, hyperlipidemia, elevated liver enzymes        Blood Work     Pt is fasting     Physical     Non Medicare Physical        Subjective:   Patient presents today for routine follow-up of his chronic medical problems which include his diabetes hypertension hyperlipidemia and history of alcohol abuse.  Overall patient is doing well.  Blood sugars have been doing good blood pressures been doing good he is feels good he has still not drinking has now been alcohol free for about 18 months.  No problems issues or concerns.    Current Outpatient Medications   Medication Sig    acamprosate (CAMPRAL) 333 mg Oral Tablet, Delayed Release (E.C.) TAKE 2 TABLETS BY MOUTH 3 TIMES A DAY    amLODIPine (NORVASC) 5 mg Oral Tablet Take 1 Tablet (5 mg total) by mouth Once a day for 180 days    aspirin 81 mg Oral Tablet, Chewable Take 1 Tab (81 mg total) by mouth Once a day    cholecalciferol, vitamin D3, 1,000 unit Oral Tablet Take 1 Tablet (1,000 Units total) by mouth Once a day    DAILY-VITE Oral Tablet TAKE 1 TABLET BY MOUTH EVERY DAY    diazePAM (VALIUM) 5 mg Oral Tablet Take 1 Tablet (5 mg total) by mouth Every 12 hours as needed for Anxiety    diphenhydrAMINE (BENADRYL) 25 mg Oral Capsule Take 1 Capsule (25 mg total) by mouth Every 6 hours as needed    EPINEPHrine (EPIPEN) 0.3 mg/0.3 mL Injection Auto-Injector 0.3 mL (0.3 mg total) by Intramuscular route Once, as needed for up to 1 dose    fluticasone (FLONASE) 50 mcg/actuation Nasal Spray, Suspension 1 Spray by Each Nostril route Twice daily    folic acid (FOLVITE) 1 mg Oral Tablet TAKE 1 TABLET BY MOUTH EVERY DAY    lansoprazole (PREVACID) 30 mg Oral  Capsule, Delayed Release(E.C.) TAKE 1 CAPSULE BY MOUTH EVERY DAY INDICATIONS: GASTROESOPHAGEAL REFLUX DISEASE    loratadine (CLARITIN) 10 mg Oral Tablet Take 1 Tablet (10 mg total) by mouth Once a day    losartan (COZAAR) 50 mg Oral Tablet TAKE 1 TABLET BY MOUTH EVERY DAY    metoprolol succinate (TOPROL-XL) 25 mg Oral Tablet Sustained Release 24 hr TAKE 1 TABLET BY MOUTH ONCE A DAY    mirtazapine (REMERON) 15 mg Oral Tablet TAKE 1 TABLET BY MOUTH EVERY NIGHT.    naltrexone microspheres (VIVITROL) 380 mg IntraMUSCULAR Suspension,Sust.Release Recon INJECT 380 MG INTO THE MUSCLE EVERY 28 DAYS    naphazoline-pheniramine (OPCON-A) 0.02675-0.315 % Ophthalmic Drops Instill 1 Drop into both eyes Once per day as needed    rosuvastatin (CRESTOR) 10 mg Oral Tablet Take 1 Tablet (10 mg total) by mouth Every evening    sertraline (ZOLOFT) 50 mg Oral Tablet TAKE 1 TABLET BY MOUTH EVERY DAY    thiamine (VITAMIN B1) 100 mg Oral Tablet TAKE 1 TABLET BY MOUTH EVERY DAY    tirzepatide (MOUNJARO) 2.5 mg/0.5 mL Subcutaneous Pen Injector Inject 0.5 mL (2.5 mg total) under the skin  Every 7 days       Objective:   BP 112/77   Pulse 84   Resp 18   Ht 1.803 m (5' 10.98")   Wt 98 kg (216 lb)   SpO2 96%   BMI 30.14 kg/m       General appearance: alert, oriented x 3, in his normal state, cooperative, not in apparent distress, appearing stated age   HEENT:  Eyes:  Pupils equal round react like accommodation extraocular muscles intact. Ears within normal limits throat clear, tonsils normal, no cervical lymphadenopathy thyroid normal  Lungs: clear to auscultation bilaterally, respirations non-labored  Heart: regular rate and rhythm, S1, S2 normal, no murmur, PMI non-diffuse  Abdomen: soft, non-tender. Bowel sounds normal.  Extremities: extremities normal, atraumatic, no cyanosis or edema, pulses intact in upper and lower extremities    Assessment     ENCOUNTER DIAGNOSES     ICD-10-CM   1. Routine general medical examination at a health care  facility  Z00.00   2. Alcohol abuse  F10.10   3. Essential hypertension  I10   4. Type 2 diabetes mellitus without complication, without long-term current use of insulin (CMS HCC)  E11.9   5. Mixed hyperlipidemia  E78.2       Plan     Orders Placed This Encounter    amLODIPine (NORVASC) 5 mg Oral Tablet                 Patient is to continue on all current medications other than those changed in the orders.     Overall patient is doing very well blood pressure certainly is good we would like to try to reduce some of his medication we are going to reduce his amlodipine from 10 mg to 5 mg would like for the patient to continue to monitor his blood pressure let me know if he has any issues.  We also possibly discussed decreasing or discontinuing his Vivitrol.  Patient is going to start extending that out from every 28 days to every 5 weeks and then if doing well do every 6 weeks and if doing well we will discontinue.  Will have patient recheck here in 3 months certainly sooner for problems likely do blood work at that time.  Overall encourage patient to eat healthy continue being active exercise regularly.  Patient states that he is very active and manage maintaining his weight at this point.  No side effects from any of his medications.    Quita Skye Fredric Mare, M.D.

## 2023-05-28 ENCOUNTER — Encounter (INDEPENDENT_AMBULATORY_CARE_PROVIDER_SITE_OTHER): Payer: Self-pay | Admitting: Family Medicine

## 2023-05-31 ENCOUNTER — Encounter (INDEPENDENT_AMBULATORY_CARE_PROVIDER_SITE_OTHER): Payer: Self-pay | Admitting: Family Medicine

## 2023-05-31 ENCOUNTER — Other Ambulatory Visit (INDEPENDENT_AMBULATORY_CARE_PROVIDER_SITE_OTHER): Payer: Self-pay

## 2023-06-02 ENCOUNTER — Other Ambulatory Visit (INDEPENDENT_AMBULATORY_CARE_PROVIDER_SITE_OTHER): Payer: Self-pay | Admitting: Family Medicine

## 2023-06-02 MED ORDER — MOUNJARO 2.5 MG/0.5 ML SUBCUTANEOUS PEN INJECTOR
2.5000 mg | PEN_INJECTOR | SUBCUTANEOUS | 3 refills | Status: DC
Start: 2023-06-02 — End: 2024-07-18

## 2023-06-21 LAB — LAB: COLOGUARD RESULT: NEGATIVE

## 2023-06-21 LAB — FECAL DNA TESTING (AMB): FECAL DNA TEST (AMB): NEGATIVE

## 2023-06-21 LAB — COLOGUARD® COLON CANCER SCREEN: COLOGUARD RESULT: NEGATIVE

## 2023-07-03 ENCOUNTER — Other Ambulatory Visit (INDEPENDENT_AMBULATORY_CARE_PROVIDER_SITE_OTHER): Payer: Self-pay | Admitting: Family Medicine

## 2023-09-15 ENCOUNTER — Other Ambulatory Visit: Payer: BC Managed Care – PPO | Attending: Family Medicine | Admitting: Family Medicine

## 2023-09-15 ENCOUNTER — Other Ambulatory Visit: Payer: Self-pay

## 2023-09-15 ENCOUNTER — Ambulatory Visit (INDEPENDENT_AMBULATORY_CARE_PROVIDER_SITE_OTHER): Payer: BC Managed Care – PPO | Admitting: Family Medicine

## 2023-09-15 ENCOUNTER — Ambulatory Visit (INDEPENDENT_AMBULATORY_CARE_PROVIDER_SITE_OTHER): Payer: BC Managed Care – PPO

## 2023-09-15 ENCOUNTER — Encounter (INDEPENDENT_AMBULATORY_CARE_PROVIDER_SITE_OTHER): Payer: Self-pay | Admitting: Family Medicine

## 2023-09-15 VITALS — BP 104/76 | HR 78 | Resp 18 | Ht 70.98 in | Wt 219.0 lb

## 2023-09-15 DIAGNOSIS — I10 Essential (primary) hypertension: Secondary | ICD-10-CM

## 2023-09-15 DIAGNOSIS — E119 Type 2 diabetes mellitus without complications: Secondary | ICD-10-CM | POA: Insufficient documentation

## 2023-09-15 DIAGNOSIS — E782 Mixed hyperlipidemia: Secondary | ICD-10-CM

## 2023-09-15 LAB — HGA1C (HEMOGLOBIN A1C WITH EST AVG GLUCOSE)
ESTIMATED AVERAGE GLUCOSE: 117 mg/dL
HEMOGLOBIN A1C: 5.7 % (ref 4.1–5.7)

## 2023-09-15 LAB — LIPID PANEL
CHOL/HDL RATIO: 3.6
CHOLESTEROL: 148 mg/dL (ref 100–200)
HDL CHOL: 41 mg/dL — ABNORMAL LOW (ref >=50)
LDL CALC: 82 mg/dL (ref <100)
NON-HDL: 107 mg/dL (ref <=190)
TRIGLYCERIDES: 140 mg/dL (ref <150)
VLDL CALC: 22 mg/dL (ref <30)

## 2023-09-15 LAB — COMPREHENSIVE METABOLIC PNL, FASTING
ALBUMIN: 4 g/dL (ref 3.4–4.8)
ALKALINE PHOSPHATASE: 81 U/L (ref 45–115)
ALT (SGPT): 19 U/L (ref 10–55)
ANION GAP: 7 mmol/L (ref 4–13)
AST (SGOT): 23 U/L (ref 8–45)
BILIRUBIN TOTAL: 0.6 mg/dL (ref 0.3–1.3)
BUN/CREA RATIO: 9 (ref 6–22)
BUN: 10 mg/dL (ref 8–25)
CALCIUM: 10.3 mg/dL (ref 8.6–10.3)
CHLORIDE: 99 mmol/L (ref 96–111)
CO2 TOTAL: 29 mmol/L (ref 23–31)
CREATININE: 1.16 mg/dL (ref 0.75–1.35)
ESTIMATED GFR - MALE: 72 mL/min/BSA (ref >=60)
GLUCOSE: 94 mg/dL (ref 70–99)
POTASSIUM: 3.9 mmol/L (ref 3.5–5.1)
PROTEIN TOTAL: 7.6 g/dL (ref 6.0–8.0)
SODIUM: 135 mmol/L — ABNORMAL LOW (ref 136–145)

## 2023-09-15 LAB — CBC
HCT: 50.4 % (ref 38.9–52.0)
HGB: 16.5 g/dL (ref 13.4–17.5)
MCH: 30 pg (ref 26.0–32.0)
MCHC: 32.7 g/dL (ref 31.0–35.5)
MCV: 91.6 fL (ref 78.0–100.0)
MPV: 10.3 fL (ref 8.7–12.5)
PLATELETS: 268 10*3/uL (ref 150–400)
RBC: 5.5 10*6/uL (ref 4.50–6.10)
RDW-CV: 13.8 % (ref 11.5–15.5)
WBC: 8.7 10*3/uL (ref 3.7–11.0)

## 2023-09-15 LAB — MICROALBUMIN/CREATININE RATIO, URINE, RANDOM
CREATININE RANDOM URINE: 102 mg/dL
MICROALBUMIN RANDOM URINE: 1.6 mg/dL
MICROALBUMIN/CREATININE RATIO RANDOM URINE: 15.7 mg/g (ref <30.0)

## 2023-09-15 NOTE — Progress Notes (Signed)
[facility]  [address]  [place] [address]  Dept Phone: [phone]  Dept Fax: [phone]    Danny [name]  10/26/1961  [id number]    Date of Service: 09/15/2023   Chief complaint:   Chief Complaint   Patient presents with    Follow Up 3 Months     Type 2 diabetes, alcohol abuse, hypertension, hyperlipidemia, elevated liver enzymes    Blood Work     Pt is fasting - needs microalbumin and HA1C        Subjective:   Patient presents today for routine follow-up of his chronic medical problems which include his diabetes hypertension hyperlipidemia and alcohol use disorder.  Overall patient is doing well.  His current situation as far as his health is doing very well.  He has been off alcohol completely for 18 months now.  Blood sugars are good blood pressure is good we had reduced his blood pressure medication in his last visit his blood pressure remains normal to low normal.    Current Outpatient Medications   Medication Sig    acamprosate (CAMPRAL) 333 mg Oral Tablet, Delayed Release (E.C.) TAKE 2 TABLETS BY MOUTH 3 TIMES A DAY    aspirin 81 mg Oral Tablet, Chewable Take 1 Tab (81 mg total) by mouth Once a day    cholecalciferol, vitamin D3, 1,000 unit Oral Tablet Take 1 Tablet (1,000 Units total) by mouth Once a day    DAILY-VITE Oral Tablet TAKE 1 TABLET BY MOUTH EVERY DAY    diazePAM (VALIUM) 5 mg Oral Tablet Take 1 Tablet (5 mg total) by mouth Every 12 hours as needed for Anxiety    diphenhydrAMINE (BENADRYL) 25 mg Oral Capsule Take 1 Capsule (25 mg total) by mouth Every 6 hours as needed    EPINEPHrine (EPIPEN) 0.3 mg/0.3 mL Injection Auto-Injector 0.3 mL (0.3 mg total) by Intramuscular route Once, as needed for up to 1 dose    fluticasone (FLONASE) 50 mcg/actuation Nasal Spray, Suspension 1 Spray by Each Nostril route Twice daily    folic acid (FOLVITE) 1 mg Oral Tablet TAKE 1 TABLET BY MOUTH EVERY DAY    lansoprazole (PREVACID) 30 mg Oral Capsule, Delayed Release(E.C.) TAKE 1  CAPSULE BY MOUTH EVERY DAY. INDICATIONS GERD    loratadine (CLARITIN) 10 mg Oral Tablet Take 1 Tablet (10 mg total) by mouth Once a day    losartan (COZAAR) 50 mg Oral Tablet TAKE 1 TABLET BY MOUTH EVERY DAY    metoprolol succinate (TOPROL-XL) 25 mg Oral Tablet Sustained Release 24 hr TAKE 1 TABLET BY MOUTH ONCE A DAY    mirtazapine (REMERON) 15 mg Oral Tablet TAKE 1 TABLET BY MOUTH EVERY NIGHT.    naphazoline-pheniramine (OPCON-A) 0.02675-0.315 % Ophthalmic Drops Instill 1 Drop into both eyes Once per day as needed    rosuvastatin (CRESTOR) 10 mg Oral Tablet Take 1 Tablet (10 mg total) by mouth Every evening    sertraline (ZOLOFT) 50 mg Oral Tablet TAKE 1 TABLET BY MOUTH EVERY DAY    thiamine (VITAMIN B1) 100 mg Oral Tablet TAKE 1 TABLET BY MOUTH EVERY DAY    tirzepatide (MOUNJARO) 2.5 mg/0.5 mL Subcutaneous Pen Injector Inject 0.5 mL (2.5 mg total) under the skin Every 7 days       Objective:   BP 104/76   Pulse 78   Resp 18   Ht 1.803 m (5' 10.98")   Wt 99.3 kg (219 lb)   SpO2 96%   BMI  30.56 kg/m       General appearance: alert, oriented x 3, in his normal state, cooperative, not in apparent distress, appearing stated age   HEENT:  Eyes:  Pupils equal round react like accommodation extraocular muscles intact. Ears within normal limits throat clear, tonsils normal, no cervical lymphadenopathy thyroid normal  Lungs: clear to auscultation bilaterally, respirations non-labored  Heart: regular rate and rhythm, S1, S2 normal, no murmur, PMI non-diffuse  Abdomen: soft, non-tender. Bowel sounds normal.  Extremities: extremities normal, atraumatic, no cyanosis or edema, pulses intact in upper and lower extremities    Assessment     ENCOUNTER DIAGNOSES     ICD-10-CM   1. Essential hypertension  I10   2. Type 2 diabetes mellitus without complication, without long-term current use of insulin (CMS HCC)  E11.9   3. Mixed hyperlipidemia  E78.2       Plan     Orders Placed This Encounter    Microalbumin/Creatinine Ratio,  Urine, Random    CBC    COMPREHENSIVE METABOLIC PNL, FASTING    LIPID PANEL    HGA1C (HEMOGLOBIN A1C WITH EST AVG GLUCOSE)       Depression screening is negative. PHQ 2 Total: 0             Patient is to continue on all current medications other than those changed in the orders.     Overall patient seems to be doing well at this point I am going to have him discontinue his amlodipine altogether.  I would ask that he monitor his blood pressure over the next several weeks and if there is any issues obviously can go back on the medication but will need to notify so we can put him back on his list.  As far as his diabetes hyperlipidemia going to check labs and call patient with results.  As far as his alcohol he continues to remain off alcohol and we did have a long discussion about the need to completely refrain from this altogether.  At this point patient is in agreement.  Will have patient recheck here in 6 months certainly sooner for problems.    Quita Skye Fredric Mare, M.D.

## 2023-10-22 ENCOUNTER — Other Ambulatory Visit (INDEPENDENT_AMBULATORY_CARE_PROVIDER_SITE_OTHER): Payer: Self-pay | Admitting: Family Medicine

## 2023-10-25 ENCOUNTER — Other Ambulatory Visit (INDEPENDENT_AMBULATORY_CARE_PROVIDER_SITE_OTHER): Payer: Self-pay | Admitting: Family Medicine

## 2023-11-30 ENCOUNTER — Other Ambulatory Visit (INDEPENDENT_AMBULATORY_CARE_PROVIDER_SITE_OTHER): Payer: Self-pay | Admitting: Family Medicine

## 2023-12-02 ENCOUNTER — Other Ambulatory Visit (INDEPENDENT_AMBULATORY_CARE_PROVIDER_SITE_OTHER): Payer: Self-pay | Admitting: Family Medicine

## 2024-01-22 ENCOUNTER — Other Ambulatory Visit (INDEPENDENT_AMBULATORY_CARE_PROVIDER_SITE_OTHER): Payer: Self-pay | Admitting: Family Medicine

## 2024-02-08 ENCOUNTER — Other Ambulatory Visit (INDEPENDENT_AMBULATORY_CARE_PROVIDER_SITE_OTHER): Payer: Self-pay | Admitting: Family Medicine

## 2024-02-26 ENCOUNTER — Other Ambulatory Visit (INDEPENDENT_AMBULATORY_CARE_PROVIDER_SITE_OTHER): Payer: Self-pay | Admitting: Family Medicine

## 2024-03-14 ENCOUNTER — Other Ambulatory Visit: Attending: Family Medicine | Admitting: Family Medicine

## 2024-03-14 ENCOUNTER — Ambulatory Visit (INDEPENDENT_AMBULATORY_CARE_PROVIDER_SITE_OTHER)

## 2024-03-14 ENCOUNTER — Ambulatory Visit (INDEPENDENT_AMBULATORY_CARE_PROVIDER_SITE_OTHER): Payer: Self-pay | Admitting: Family Medicine

## 2024-03-14 ENCOUNTER — Encounter (INDEPENDENT_AMBULATORY_CARE_PROVIDER_SITE_OTHER): Payer: Self-pay | Admitting: Family Medicine

## 2024-03-14 ENCOUNTER — Other Ambulatory Visit: Payer: Self-pay

## 2024-03-14 VITALS — BP 118/70 | HR 94 | Ht 70.98 in | Wt 203.0 lb

## 2024-03-14 DIAGNOSIS — E782 Mixed hyperlipidemia: Secondary | ICD-10-CM

## 2024-03-14 DIAGNOSIS — I10 Essential (primary) hypertension: Secondary | ICD-10-CM

## 2024-03-14 DIAGNOSIS — Z125 Encounter for screening for malignant neoplasm of prostate: Secondary | ICD-10-CM | POA: Insufficient documentation

## 2024-03-14 DIAGNOSIS — E119 Type 2 diabetes mellitus without complications: Secondary | ICD-10-CM

## 2024-03-14 LAB — CBC
HCT: 48.1 % (ref 38.9–52.0)
HGB: 16.3 g/dL (ref 13.4–17.5)
MCH: 30.1 pg (ref 26.0–32.0)
MCHC: 33.9 g/dL (ref 31.0–35.5)
MCV: 88.9 fL (ref 78.0–100.0)
MPV: 10.2 fL (ref 8.7–12.5)
PLATELETS: 285 10*3/uL (ref 150–400)
RBC: 5.41 10*6/uL (ref 4.50–6.10)
RDW-CV: 14.2 % (ref 11.5–15.5)
WBC: 7.2 10*3/uL (ref 3.7–11.0)

## 2024-03-14 LAB — COMPREHENSIVE METABOLIC PNL, FASTING
ALBUMIN: 4.4 g/dL (ref 3.4–4.8)
ALKALINE PHOSPHATASE: 73 U/L (ref 45–115)
ALT (SGPT): 24 U/L (ref 10–55)
ANION GAP: 8 mmol/L (ref 4–13)
AST (SGOT): 28 U/L (ref 8–45)
BILIRUBIN TOTAL: 0.6 mg/dL (ref 0.3–1.3)
BUN/CREA RATIO: 7 (ref 6–22)
BUN: 7 mg/dL — ABNORMAL LOW (ref 8–25)
CALCIUM: 9.7 mg/dL (ref 8.6–10.3)
CHLORIDE: 104 mmol/L (ref 96–111)
CO2 TOTAL: 27 mmol/L (ref 23–31)
CREATININE: 1.05 mg/dL (ref 0.75–1.35)
ESTIMATED GFR - MALE: 80 mL/min/BSA (ref >=60)
GLUCOSE: 104 mg/dL — ABNORMAL HIGH (ref 70–99)
POTASSIUM: 3.7 mmol/L (ref 3.5–5.1)
PROTEIN TOTAL: 7.9 g/dL (ref 6.0–8.0)
SODIUM: 139 mmol/L (ref 136–145)

## 2024-03-14 LAB — LIPID PANEL
CHOL/HDL RATIO: 2.8
CHOLESTEROL: 132 mg/dL (ref 100–200)
HDL CHOL: 48 mg/dL — ABNORMAL LOW (ref >=50)
LDL CALC: 69 mg/dL (ref <100)
NON-HDL: 84 mg/dL (ref <=190)
TRIGLYCERIDES: 72 mg/dL (ref <150)
VLDL CALC: 11 mg/dL (ref <30)

## 2024-03-14 LAB — HGA1C (HEMOGLOBIN A1C WITH EST AVG GLUCOSE)
ESTIMATED AVERAGE GLUCOSE: 120 mg/dL
HEMOGLOBIN A1C: 5.8 % — ABNORMAL HIGH (ref 4.1–5.7)

## 2024-03-14 LAB — PSA SCREENING: PSA: 0.57 ng/mL (ref <=4.00)

## 2024-03-14 MED ORDER — METOPROLOL SUCCINATE ER 25 MG TABLET,EXTENDED RELEASE 24 HR
12.5000 mg | ORAL_TABLET | Freq: Every day | ORAL | 3 refills | Status: DC
Start: 2024-03-14 — End: 2024-09-14

## 2024-03-14 NOTE — Progress Notes (Signed)
 [facility]  [address]  [place] [address]  Dept Phone: [phone]  Dept Fax: [phone]    Caylen Yardley  1961/01/24  [id number]    Date of Service: 03/14/2024   Chief complaint:   Chief Complaint   Patient presents with    Follow Up 6 Months      diabetes hypertension hyperlipidemia and alcohol use disorder.    Blood Work     Pt is fasting     Medication Adjustment     Pt is taking the Mounjarno 2.5 every 10 days - pt is exercising daily    Slow Heart Rate     Resting heart rate is in 50"s  but blood pressure is good        Subjective:   Patient presents today for routine 6 month followup to discuss his chronic medical problems which include his history of alcohol abuse disorder hypertension hyperlipidemia and diabetes.  Overall patient is doing very well.  He continues to not consume alcohol.  He is exercising has lost weight his blood sugars are excellent he feels better his blood pressure is good he really has no complaints or concerns.    Current Outpatient Medications   Medication Sig    acamprosate  (CAMPRAL ) 333 mg Oral Tablet, Delayed Release (E.C.) TAKE 2 TABLETS BY MOUTH 3 TIMES A DAY    aspirin  81 mg Oral Tablet, Chewable Take 1 Tab (81 mg total) by mouth Once a day    cholecalciferol, vitamin D3, 1,000 unit Oral Tablet Take 1 Tablet (1,000 Units total) by mouth Daily    DAILY-VITE Oral Tablet TAKE 1 TABLET BY MOUTH EVERY DAY    diazePAM  (VALIUM ) 5 mg Oral Tablet Take 1 Tablet (5 mg total) by mouth Every 12 hours as needed for Anxiety    diphenhydrAMINE (BENADRYL) 25 mg Oral Capsule Take 1 Capsule (25 mg total) by mouth Every 6 hours as needed    EPINEPHrine  (EPIPEN ) 0.3 mg/0.3 mL Injection Auto-Injector 0.3 mL (0.3 mg total) by Intramuscular route Once, as needed for up to 1 dose    fluticasone  (FLONASE ) 50 mcg/actuation Nasal Spray, Suspension 1 Spray by Each Nostril route Twice daily    folic acid (FOLVITE) 1 mg Oral Tablet TAKE ONE TABLET BY MOUTH ONCE A DAY     lansoprazole  (PREVACID ) 30 mg Oral Capsule, Delayed Release(E.C.) TAKE 1 CAPSULE BY MOUTH EVERY DAY. INDICATIONS GERD    loratadine (CLARITIN) 10 mg Oral Tablet Take 1 Tablet (10 mg total) by mouth Daily    losartan  (COZAAR ) 50 mg Oral Tablet TAKE 1 TABLET BY MOUTH EVERY DAY    metoprolol  succinate (TOPROL -XL) 25 mg Oral Tablet Sustained Release 24 hr Take 0.5 Tablets (12.5 mg total) by mouth Daily    mirtazapine  (REMERON ) 15 mg Oral Tablet TAKE 1 TABLET BY MOUTH EVERY NIGHT.    naphazoline-pheniramine (OPCON-A) 0.02675-0.315 % Ophthalmic Drops Instill 1 Drop into both eyes Once per day as needed    rosuvastatin  (CRESTOR ) 10 mg Oral Tablet Take 1 Tablet (10 mg total) by mouth Every evening    sertraline  (ZOLOFT ) 50 mg Oral Tablet TAKE 1 TABLET BY MOUTH EVERY DAY    thiamine (VITAMIN B1) 100 mg Oral Tablet TAKE 1 TABLET BY MOUTH EVERY DAY    tirzepatide  (MOUNJARO ) 2.5 mg/0.5 mL Subcutaneous Pen Injector Inject 0.5 mL (2.5 mg total) under the skin Every 7 days       Objective:   BP 118/70 (Patient Position: Sitting)  Pulse 94   Ht 1.803 m (5' 10.98")   Wt 92.1 kg (203 lb)   SpO2 99%   BMI 28.33 kg/m       General appearance: alert, oriented x 3, in his normal state, cooperative, not in apparent distress, appearing stated age   HEENT:  Eyes:  Pupils equal round react like accommodation extraocular muscles intact. Ears within normal limits throat clear, tonsils normal, no cervical lymphadenopathy thyroid  normal  Lungs: clear to auscultation bilaterally, respirations non-labored  Heart: regular rate and rhythm, S1, S2 normal, no murmur, PMI non-diffuse  Abdomen: soft, non-tender. Bowel sounds normal.  Extremities: extremities normal, atraumatic, no cyanosis or edema, pulses intact in upper and lower extremities    Assessment     ENCOUNTER DIAGNOSES     ICD-10-CM   1. Essential hypertension  I10   2. Type 2 diabetes mellitus without complication, without long-term current use of insulin (CMS HCC)  E11.9   3. Mixed  hyperlipidemia  E78.2   4. Screening for prostate cancer  Z12.5       Plan     Orders Placed This Encounter    CBC    COMPREHENSIVE METABOLIC PNL, FASTING    HGA1C (HEMOGLOBIN A1C WITH EST AVG GLUCOSE)    LIPID PANEL    PSA SCREENING    metoprolol  succinate (TOPROL -XL) 25 mg Oral Tablet Sustained Release 24 hr       Depression screening is negative. PHQ 2 Total: 0             Patient is to continue on all current medications other than those changed in the orders.     Overall patient seems to be doing well as far as his blood pressure is currently well managed.  He states he has a resting heart rate in the 50s I am going to go ahead and reduce his metoprolol  from 25 mg to 12.5 mg.  As far as his diabetes hyperlipidemia we are going to check lab work will call patient with results.  Recheck here in 6 months certainly sooner for problems.    Tatiana Farrier Toy Freund, M.D.

## 2024-05-25 ENCOUNTER — Other Ambulatory Visit (INDEPENDENT_AMBULATORY_CARE_PROVIDER_SITE_OTHER): Payer: Self-pay | Admitting: Family Medicine

## 2024-06-14 ENCOUNTER — Encounter (INDEPENDENT_AMBULATORY_CARE_PROVIDER_SITE_OTHER): Payer: Self-pay | Admitting: Family Medicine

## 2024-06-15 ENCOUNTER — Encounter (INDEPENDENT_AMBULATORY_CARE_PROVIDER_SITE_OTHER): Payer: Self-pay | Admitting: Family Medicine

## 2024-07-18 ENCOUNTER — Other Ambulatory Visit (INDEPENDENT_AMBULATORY_CARE_PROVIDER_SITE_OTHER): Payer: Self-pay | Admitting: Family Medicine

## 2024-07-18 MED ORDER — MOUNJARO 2.5 MG/0.5 ML SUBCUTANEOUS PEN INJECTOR
2.5000 mg | PEN_INJECTOR | SUBCUTANEOUS | 3 refills | Status: AC
Start: 2024-07-18 — End: ?

## 2024-08-29 ENCOUNTER — Other Ambulatory Visit (INDEPENDENT_AMBULATORY_CARE_PROVIDER_SITE_OTHER): Payer: Self-pay | Admitting: Family Medicine

## 2024-09-14 ENCOUNTER — Ambulatory Visit (INDEPENDENT_AMBULATORY_CARE_PROVIDER_SITE_OTHER): Payer: Self-pay | Admitting: Family Medicine

## 2024-09-14 ENCOUNTER — Other Ambulatory Visit: Attending: Family Medicine | Admitting: Family Medicine

## 2024-09-14 ENCOUNTER — Ambulatory Visit (INDEPENDENT_AMBULATORY_CARE_PROVIDER_SITE_OTHER)

## 2024-09-14 ENCOUNTER — Encounter (INDEPENDENT_AMBULATORY_CARE_PROVIDER_SITE_OTHER): Payer: Self-pay | Admitting: Family Medicine

## 2024-09-14 ENCOUNTER — Other Ambulatory Visit: Payer: Self-pay

## 2024-09-14 VITALS — BP 114/75 | HR 59 | Resp 18 | Ht 70.0 in | Wt 161.0 lb

## 2024-09-14 DIAGNOSIS — E782 Mixed hyperlipidemia: Secondary | ICD-10-CM

## 2024-09-14 DIAGNOSIS — E119 Type 2 diabetes mellitus without complications: Secondary | ICD-10-CM

## 2024-09-14 DIAGNOSIS — I10 Essential (primary) hypertension: Secondary | ICD-10-CM | POA: Insufficient documentation

## 2024-09-14 DIAGNOSIS — R001 Bradycardia, unspecified: Secondary | ICD-10-CM

## 2024-09-14 DIAGNOSIS — Z23 Encounter for immunization: Secondary | ICD-10-CM

## 2024-09-14 LAB — LIPID PANEL
CHOL/HDL RATIO: 3.3
CHOLESTEROL: 178 mg/dL (ref 100–200)
HDL CHOL: 54 mg/dL (ref >=50)
LDL CALC: 109 mg/dL — ABNORMAL HIGH (ref <100)
NON-HDL: 124 mg/dL (ref <=190)
TRIGLYCERIDES: 83 mg/dL (ref <150)
VLDL CALC: 14 mg/dL (ref <30)

## 2024-09-14 LAB — COMPREHENSIVE METABOLIC PNL, FASTING
ALBUMIN: 4.4 g/dL (ref 3.4–4.8)
ALKALINE PHOSPHATASE: 88 U/L (ref 45–115)
ALT (SGPT): 20 U/L (ref 10–55)
ANION GAP: 7 mmol/L (ref 4–13)
AST (SGOT): 27 U/L (ref 8–45)
BILIRUBIN TOTAL: 0.4 mg/dL (ref 0.3–1.3)
BUN/CREA RATIO: 20 (ref 6–22)
BUN: 18 mg/dL (ref 8–25)
CALCIUM: 9.9 mg/dL (ref 8.6–10.3)
CHLORIDE: 104 mmol/L (ref 96–111)
CO2 TOTAL: 29 mmol/L (ref 23–31)
CREATININE: 0.92 mg/dL (ref 0.75–1.35)
GLUCOSE: 103 mg/dL — ABNORMAL HIGH (ref 70–99)
POTASSIUM: 4.3 mmol/L (ref 3.5–5.1)
PROTEIN TOTAL: 7.8 g/dL (ref 6.0–8.0)
SODIUM: 140 mmol/L (ref 136–145)
eGFRcr - MALE: >90 mL/min/1.73mˆ2 (ref >=60)

## 2024-09-14 LAB — CBC
HCT: 45.3 % (ref 38.9–52.0)
HGB: 15.3 g/dL (ref 13.4–17.5)
MCH: 31.5 pg (ref 26.0–32.0)
MCHC: 33.8 g/dL (ref 31.0–35.5)
MCV: 93.2 fL (ref 78.0–100.0)
MPV: 10.4 fL (ref 8.7–12.5)
PLATELETS: 259 x10ˆ3/uL (ref 150–400)
RBC: 4.86 x10ˆ6/uL (ref 4.50–6.10)
RDW-CV: 14.6 % (ref 11.5–15.5)
WBC: 6.1 x10ˆ3/uL (ref 3.7–11.0)

## 2024-09-14 LAB — HGA1C (HEMOGLOBIN A1C WITH EST AVG GLUCOSE)
ESTIMATED AVERAGE GLUCOSE: 100 mg/dL
HEMOGLOBIN A1C: 5.1 % (ref 4.1–5.7)

## 2024-09-14 LAB — THYROID STIMULATING HORMONE (SENSITIVE TSH): TSH: 3.512 u[IU]/mL (ref 0.350–4.940)

## 2024-09-14 LAB — MICROALBUMIN/CREATININE RATIO, URINE, RANDOM
CREATININE RANDOM URINE: 116 mg/dL
MICROALBUMIN RANDOM URINE: 1.1 mg/dL
MICROALBUMIN/CREATININE RATIO RANDOM URINE: 9.5 mg/g (ref <30.0)

## 2024-09-14 NOTE — Progress Notes (Signed)
 [facility]  [address]  [place] [address]  Dept Phone: [phone]  Dept Fax: [phone]    Danny [name]  10-17-1961  [id number]    Date of Service: 09/14/2024   Chief complaint:   Chief Complaint   Patient presents with    Follow Up 6 Months      diabetes hypertension hyperlipidemia and alcohol use disorder., cold fingers turning white     Blood Work     Patient is fasting     Annual Exam     Non medicare exam     Fingers Contract               Irregular Heart Beat     Low pulse resting at 54-55, not worry so much he is exercising      Pain     Right wrist , test for Arthritis     Sweats       Subjective:   Patient presents today for routine follow-up of his chronic medical problems which include his history of alcohol abuse hypertension diabetes hyperlipidemia he is also having an issue with right wrist pain.  Patient reports his blood pressures been good he has been on Mon jar 0 for his diabetes and has had significant weight loss.  He has lost about 40 lb since his last appointment here.  Patient states overall he feels good.  He denies any chest pain or shortness a breath.  He has been to Orthopedics for his right wrist and was told that it was arthritis he had an injection which gave him some improvement.  He also has a problem with the middle finger of his right hand it turns her white occasionally has to heat it up or warm it up in some way in order to get this resolved.  Occasionally will happened in the right index finger but it is primarily right middle finger.    Current Outpatient Medications   Medication Sig    aspirin  81 mg Oral Tablet, Chewable Take 1 Tab (81 mg total) by mouth Once a day    cholecalciferol, vitamin D3, 1,000 unit Oral Tablet Take 1 Tablet (1,000 Units total) by mouth Daily    diazePAM  (VALIUM ) 5 mg Oral Tablet Take 1 Tablet (5 mg total) by mouth Every 12 hours as needed for Anxiety    diphenhydrAMINE (BENADRYL) 25 mg Oral Capsule Take 1 Capsule  (25 mg total) by mouth Every 6 hours as needed    EPINEPHrine  (EPIPEN ) 0.3 mg/0.3 mL Injection Auto-Injector 0.3 mL (0.3 mg total) by Intramuscular route Once, as needed for up to 1 dose    fluticasone  (FLONASE ) 50 mcg/actuation Nasal Spray, Suspension 1 Spray by Each Nostril route Twice daily    lansoprazole  (PREVACID ) 30 mg Oral Capsule, Delayed Release(E.C.) TAKE 1 CAPSULE BY MOUTH EVERY DAY. INDICATIONS GERD    loratadine (CLARITIN) 10 mg Oral Tablet Take 1 Tablet (10 mg total) by mouth Daily    losartan  (COZAAR ) 50 mg Oral Tablet TAKE 1 TABLET BY MOUTH EVERY DAY    mirtazapine  (REMERON ) 15 mg Oral Tablet TAKE 1 TABLET BY MOUTH EVERY NIGHT.    naphazoline-pheniramine (OPCON-A) 0.02675-0.315 % Ophthalmic Drops Instill 1 Drop into both eyes Once per day as needed    rosuvastatin  (CRESTOR ) 10 mg Oral Tablet Take 1 Tablet (10 mg total) by mouth Every evening    sertraline  (ZOLOFT ) 50 mg Oral Tablet TAKE 1 TABLET BY MOUTH EVERY DAY  tirzepatide  (MOUNJARO ) 2.5 mg/0.5 mL Subcutaneous Pen Injector Inject 0.5 mL (2.5 mg total) under the skin Every 7 days       Objective:   BP 114/75   Pulse 59   Resp 18   Ht 1.778 m (5' 10)   Wt 73 kg (161 lb)   SpO2 98%   BMI 23.10 kg/m       General appearance: alert, oriented x 3, in his normal state, cooperative, not in apparent distress, appearing stated age   HEENT:  Eyes:  Pupils equal round react like accommodation extraocular muscles intact. Ears within normal limits throat clear, tonsils normal, no cervical lymphadenopathy thyroid  normal  Lungs: clear to auscultation bilaterally, respirations non-labored  Heart: regular rate and rhythm, S1, S2 normal, no murmur, PMI non-diffuse  Abdomen: soft, non-tender. Bowel sounds normal.  Extremities: extremities normal, atraumatic, no cyanosis or edema, pulses intact in upper and lower extremities    Assessment     ENCOUNTER DIAGNOSES     ICD-10-CM   1. Essential hypertension  I10   2. Type 2 diabetes mellitus without  complication, without long-term current use of insulin  E11.9   3. Mixed hyperlipidemia  E78.2   4. Bradycardia  R00.1       Plan     Orders Placed This Encounter    Microalbumin/Creatinine Ratio, Urine, Random    CBC    COMPREHENSIVE METABOLIC PNL, FASTING    HGA1C (HEMOGLOBIN A1C WITH EST AVG GLUCOSE)    LIPID PANEL    THYROID  STIMULATING HORMONE (SENSITIVE TSH)                 Patient is to continue on all current medications other than those changed in the orders.     Overall patient seems to be doing well he has lost a significant amount of weight in certainly at this point I do not want him to continue with the weight loss.  His blood sugars are excellent blood pressure is excellent I am going to have him hold his Mounjaro  in monitor his blood sugars we are going to do lab work today my guess is will probably discontinue the Mon jaw and we may be able to discontinue the rosuvastatin .  I would like for him to continue to monitor his blood pressure.  If his weight loss continues then I would like for him to reach out to us .  He has been having heart rate in the upper 50s although he does exercise a lot plays tennis a good bit.  As far as the wrist pain going to defer to Orthopedics.  As far as the finger it does sound like Raynaud's but we will continue to monitor this.  Will have patient recheck here in 6 months certainly sooner for problems.    Danny [name] Con, M.D.

## 2024-09-16 ENCOUNTER — Other Ambulatory Visit (INDEPENDENT_AMBULATORY_CARE_PROVIDER_SITE_OTHER): Payer: Self-pay | Admitting: Family Medicine

## 2025-01-12 ENCOUNTER — Telehealth (INDEPENDENT_AMBULATORY_CARE_PROVIDER_SITE_OTHER): Payer: Self-pay | Admitting: Family Medicine

## 2025-03-14 ENCOUNTER — Encounter (INDEPENDENT_AMBULATORY_CARE_PROVIDER_SITE_OTHER): Payer: Self-pay | Admitting: Family Medicine
